# Patient Record
Sex: Male | Born: 1935
Health system: Southern US, Community
[De-identification: ages and names within clinical notes are randomized; demographics above are authoritative.]

## PROBLEM LIST (undated history)

## (undated) DIAGNOSIS — I2510 Atherosclerotic heart disease of native coronary artery without angina pectoris: Secondary | ICD-10-CM

## (undated) DIAGNOSIS — R55 Syncope and collapse: Secondary | ICD-10-CM

## (undated) DIAGNOSIS — Z951 Presence of aortocoronary bypass graft: Secondary | ICD-10-CM

## (undated) DIAGNOSIS — I10 Essential (primary) hypertension: Secondary | ICD-10-CM

## (undated) DIAGNOSIS — K635 Polyp of colon: Secondary | ICD-10-CM

## (undated) DIAGNOSIS — D696 Thrombocytopenia, unspecified: Secondary | ICD-10-CM

## (undated) DIAGNOSIS — E039 Hypothyroidism, unspecified: Secondary | ICD-10-CM

## (undated) DIAGNOSIS — I25110 Atherosclerotic heart disease of native coronary artery with unstable angina pectoris: Secondary | ICD-10-CM

## (undated) DIAGNOSIS — I779 Disorder of arteries and arterioles, unspecified: Secondary | ICD-10-CM

## (undated) DIAGNOSIS — I739 Peripheral vascular disease, unspecified: Secondary | ICD-10-CM

## (undated) HISTORY — DX: Hypothyroidism, unspecified: E03.9

## (undated) HISTORY — DX: Peripheral vascular disease, unspecified: I73.9

## (undated) HISTORY — DX: Polyp of colon: K63.5

## (undated) HISTORY — DX: Atherosclerotic heart disease of native coronary artery without angina pectoris: I25.10

## (undated) HISTORY — PX: CATARACT EXTRACTION: SUR2

## (undated) HISTORY — DX: Syncope and collapse: R55

## (undated) HISTORY — PX: HEMORRHOID SURGERY: SHX153

## (undated) HISTORY — DX: Essential (primary) hypertension: I10

## (undated) HISTORY — DX: Thrombocytopenia, unspecified: D69.6

## (undated) HISTORY — PX: REFRACTIVE SURGERY: SHX103

## (undated) HISTORY — DX: Disorder of arteries and arterioles, unspecified: I77.9

---

## 2002-10-07 ENCOUNTER — Ambulatory Visit (HOSPITAL_COMMUNITY): Admission: RE | Admit: 2002-10-07 | Discharge: 2002-10-07 | Payer: Self-pay | Admitting: Gastroenterology

## 2005-06-21 ENCOUNTER — Inpatient Hospital Stay (HOSPITAL_COMMUNITY): Admission: EM | Admit: 2005-06-21 | Discharge: 2005-06-24 | Payer: Self-pay | Admitting: Emergency Medicine

## 2013-07-24 DIAGNOSIS — E039 Hypothyroidism, unspecified: Secondary | ICD-10-CM | POA: Insufficient documentation

## 2013-07-24 DIAGNOSIS — E785 Hyperlipidemia, unspecified: Secondary | ICD-10-CM | POA: Insufficient documentation

## 2013-07-24 DIAGNOSIS — I10 Essential (primary) hypertension: Secondary | ICD-10-CM | POA: Insufficient documentation

## 2013-09-04 DIAGNOSIS — I471 Supraventricular tachycardia: Secondary | ICD-10-CM | POA: Insufficient documentation

## 2013-11-05 ENCOUNTER — Encounter: Payer: Self-pay | Admitting: *Deleted

## 2014-02-25 DIAGNOSIS — I471 Supraventricular tachycardia: Secondary | ICD-10-CM | POA: Diagnosis not present

## 2014-02-25 DIAGNOSIS — Z6827 Body mass index (BMI) 27.0-27.9, adult: Secondary | ICD-10-CM | POA: Diagnosis not present

## 2014-02-25 DIAGNOSIS — I10 Essential (primary) hypertension: Secondary | ICD-10-CM | POA: Diagnosis not present

## 2014-02-25 DIAGNOSIS — R55 Syncope and collapse: Secondary | ICD-10-CM | POA: Diagnosis not present

## 2014-05-25 DIAGNOSIS — I10 Essential (primary) hypertension: Secondary | ICD-10-CM | POA: Diagnosis not present

## 2014-05-25 DIAGNOSIS — L570 Actinic keratosis: Secondary | ICD-10-CM | POA: Diagnosis not present

## 2014-07-20 DIAGNOSIS — M436 Torticollis: Secondary | ICD-10-CM | POA: Diagnosis not present

## 2014-07-20 DIAGNOSIS — M542 Cervicalgia: Secondary | ICD-10-CM | POA: Diagnosis not present

## 2014-09-07 DIAGNOSIS — B078 Other viral warts: Secondary | ICD-10-CM | POA: Diagnosis not present

## 2014-09-07 DIAGNOSIS — L570 Actinic keratosis: Secondary | ICD-10-CM | POA: Diagnosis not present

## 2014-09-28 DIAGNOSIS — H25013 Cortical age-related cataract, bilateral: Secondary | ICD-10-CM | POA: Diagnosis not present

## 2014-09-28 DIAGNOSIS — H02401 Unspecified ptosis of right eyelid: Secondary | ICD-10-CM | POA: Diagnosis not present

## 2014-09-28 DIAGNOSIS — H2513 Age-related nuclear cataract, bilateral: Secondary | ICD-10-CM | POA: Diagnosis not present

## 2014-09-28 DIAGNOSIS — H5213 Myopia, bilateral: Secondary | ICD-10-CM | POA: Diagnosis not present

## 2014-10-13 DIAGNOSIS — H25012 Cortical age-related cataract, left eye: Secondary | ICD-10-CM | POA: Diagnosis not present

## 2014-10-13 DIAGNOSIS — H2512 Age-related nuclear cataract, left eye: Secondary | ICD-10-CM | POA: Diagnosis not present

## 2014-10-13 DIAGNOSIS — H25812 Combined forms of age-related cataract, left eye: Secondary | ICD-10-CM | POA: Diagnosis not present

## 2014-10-22 ENCOUNTER — Encounter (HOSPITAL_COMMUNITY): Payer: Self-pay | Admitting: Emergency Medicine

## 2014-10-22 ENCOUNTER — Emergency Department (HOSPITAL_COMMUNITY)
Admission: EM | Admit: 2014-10-22 | Discharge: 2014-10-23 | Disposition: A | Discharge status: Home / Self Care | Payer: Commercial Managed Care - HMO | Attending: Emergency Medicine | Admitting: Emergency Medicine

## 2014-10-22 DIAGNOSIS — R05 Cough: Secondary | ICD-10-CM | POA: Insufficient documentation

## 2014-10-22 DIAGNOSIS — K2270 Barrett's esophagus without dysplasia: Secondary | ICD-10-CM | POA: Insufficient documentation

## 2014-10-22 DIAGNOSIS — R112 Nausea with vomiting, unspecified: Secondary | ICD-10-CM | POA: Diagnosis not present

## 2014-10-22 DIAGNOSIS — T18128A Food in esophagus causing other injury, initial encounter: Secondary | ICD-10-CM | POA: Diagnosis not present

## 2014-10-22 DIAGNOSIS — K219 Gastro-esophageal reflux disease without esophagitis: Secondary | ICD-10-CM | POA: Diagnosis not present

## 2014-10-22 DIAGNOSIS — R1310 Dysphagia, unspecified: Secondary | ICD-10-CM | POA: Insufficient documentation

## 2014-10-22 DIAGNOSIS — X58XXXA Exposure to other specified factors, initial encounter: Secondary | ICD-10-CM | POA: Insufficient documentation

## 2014-10-22 DIAGNOSIS — K449 Diaphragmatic hernia without obstruction or gangrene: Secondary | ICD-10-CM | POA: Diagnosis not present

## 2014-10-22 DIAGNOSIS — K222 Esophageal obstruction: Secondary | ICD-10-CM

## 2014-10-22 DIAGNOSIS — R9431 Abnormal electrocardiogram [ECG] [EKG]: Secondary | ICD-10-CM | POA: Diagnosis not present

## 2014-10-22 DIAGNOSIS — S2231XA Fracture of one rib, right side, initial encounter for closed fracture: Secondary | ICD-10-CM | POA: Insufficient documentation

## 2014-10-22 DIAGNOSIS — I10 Essential (primary) hypertension: Secondary | ICD-10-CM | POA: Diagnosis not present

## 2014-10-22 DIAGNOSIS — R0789 Other chest pain: Secondary | ICD-10-CM | POA: Insufficient documentation

## 2014-10-22 DIAGNOSIS — E039 Hypothyroidism, unspecified: Secondary | ICD-10-CM | POA: Diagnosis not present

## 2014-10-22 DIAGNOSIS — Z86010 Personal history of colonic polyps: Secondary | ICD-10-CM | POA: Insufficient documentation

## 2014-10-22 DIAGNOSIS — D696 Thrombocytopenia, unspecified: Secondary | ICD-10-CM | POA: Insufficient documentation

## 2014-10-22 DIAGNOSIS — R42 Dizziness and giddiness: Secondary | ICD-10-CM | POA: Diagnosis not present

## 2014-10-22 DIAGNOSIS — Z888 Allergy status to other drugs, medicaments and biological substances status: Secondary | ICD-10-CM | POA: Insufficient documentation

## 2014-10-22 DIAGNOSIS — Z79899 Other long term (current) drug therapy: Secondary | ICD-10-CM | POA: Diagnosis not present

## 2014-10-22 DIAGNOSIS — R1013 Epigastric pain: Secondary | ICD-10-CM | POA: Insufficient documentation

## 2014-10-22 DIAGNOSIS — Z7982 Long term (current) use of aspirin: Secondary | ICD-10-CM | POA: Diagnosis not present

## 2014-10-22 DIAGNOSIS — Y939 Activity, unspecified: Secondary | ICD-10-CM | POA: Insufficient documentation

## 2014-10-22 DIAGNOSIS — R1110 Vomiting, unspecified: Secondary | ICD-10-CM

## 2014-10-22 LAB — I-STAT TROPONIN, ED: TROPONIN I, POC: 0.01 ng/mL (ref 0.00–0.08)

## 2014-10-22 LAB — BASIC METABOLIC PANEL
ANION GAP: 11 (ref 5–15)
BUN: 17 mg/dL (ref 6–20)
CALCIUM: 9.1 mg/dL (ref 8.9–10.3)
CO2: 26 mmol/L (ref 22–32)
CREATININE: 1.43 mg/dL — AB (ref 0.61–1.24)
Chloride: 98 mmol/L — ABNORMAL LOW (ref 101–111)
GFR, EST AFRICAN AMERICAN: 53 mL/min — AB (ref >60)
GFR, EST NON AFRICAN AMERICAN: 46 mL/min — AB (ref >60)
GLUCOSE: 111 mg/dL — AB (ref 65–99)
Potassium: 3.4 mmol/L — ABNORMAL LOW (ref 3.5–5.1)
Sodium: 135 mmol/L (ref 135–145)

## 2014-10-22 LAB — CBC
HCT: 40.4 % (ref 39.0–52.0)
Hemoglobin: 14.8 g/dL (ref 13.0–17.0)
MCH: 31.3 pg (ref 26.0–34.0)
MCHC: 36.6 g/dL — AB (ref 30.0–36.0)
MCV: 85.4 fL (ref 78.0–100.0)
PLATELETS: 112 10*3/uL — AB (ref 150–400)
RBC: 4.73 MIL/uL (ref 4.22–5.81)
RDW: 13.2 % (ref 11.5–15.5)
WBC: 8.3 10*3/uL (ref 4.0–10.5)

## 2014-10-22 LAB — I-STAT CG4 LACTIC ACID, ED: Lactic Acid, Venous: 1.28 mmol/L (ref 0.5–2.0)

## 2014-10-22 MED ORDER — LACTATED RINGERS IV BOLUS (SEPSIS)
1000.0000 mL | Freq: Once | INTRAVENOUS | Status: AC
  Administered 2014-10-23: 1000 mL via INTRAVENOUS

## 2014-10-22 MED ORDER — GI COCKTAIL ~~LOC~~
30.0000 mL | Freq: Once | ORAL | Status: AC
  Administered 2014-10-23: 30 mL via ORAL
  Filled 2014-10-22: qty 30

## 2014-10-22 NOTE — ED Provider Notes (Signed)
CSN: [id number]   Arrival date & time 10/22/14 1826  History  This chart was scribed for Derwood Kaplan, MD by Bethel Born, ED Scribe. This patient was seen in room D30C/D30C and the patient's care was started at 11:33 PM.  Chief Complaint  Patient presents with  . Near Syncope  . Abdominal Pain    HPI The history is provided by the patient and the spouse. No language interpreter was used.   Wayne Mitchell is a 79 y.o. male with PMHx HTN, carotid artery disease, hypothyroidism, and thrombocytopenia  who presents to the Emergency Department complaining of an episode of light headedness with onset around 10:30 AM while standing. He felt unsteady and had to hold himself up on a wall. The episode lasted for a few seconds. He didn't feel like he was going to faint. At that time he had no sweating, chest pain, or palpitations. Since the initial episode he has had several other less severe episodes of light headedness, usually with walking. Around lunch time the pt was at a restaurant when he had sudden onset of abdominal discomfort that he describes as bloating and started to produce white frothy sputum that he believes came from his stomach.  Associated symptoms include fatigue, chest pain (a constant and non-radiating "knot" in the center of the chest that has improved and is not affected by movement), and intermittent nausea (none at present).  His appetite has gone away since then. Pt notes 1 loose stool with no blood. He has had a dry cough for 1 week. He was referred to the ED today by Urgent Care. No history of cardiac disease, cancer, COPD, asthma, or DVT/PE. No history of abdominal surgery. Pt denies tobacco use.   Past Medical History  Diagnosis Date  . Syncope   . HTN (hypertension)   . Carotid artery disease   . Hypothyroidism   . Thrombocytopenia   . Colon polyp     small    Past Surgical History  Procedure Laterality Date  . Cataract extraction Left     Family History   Problem Relation Age of Onset  . Family history unknown: Yes    Social History  Substance Use Topics  . Smoking status: Never Smoker   . Smokeless tobacco: None  . Alcohol Use: 2.4 oz/week    4 Cans of beer per week     Review of Systems  Constitutional: Positive for appetite change and fatigue. Negative for diaphoresis.  Respiratory: Positive for cough. Negative for shortness of breath.   Cardiovascular: Positive for chest pain.  Gastrointestinal: Positive for nausea, abdominal pain and diarrhea (1 loose stool).       Spitting frothy white sputum  Neurological: Positive for light-headedness.    ROS 10 Systems reviewed and are negative for acute change except as noted in the HPI.    Home Medications   Prior to Admission medications   Medication Sig Start Date End Date Taking? Authorizing Provider  amLODipine (NORVASC) 10 MG tablet Take 10 mg by mouth daily.   Yes Historical Provider, MD  aspirin 81 MG tablet Take 81 mg by mouth daily.   Yes Historical Provider, MD  atorvastatin (LIPITOR) 20 MG tablet Take 20 mg by mouth daily.   Yes Historical Provider, MD  carvedilol (COREG) 6.25 MG tablet Take 3.125-6.25 mg by mouth See admin instructions. Take 1 tablet in the morning  and 0.5 tablet (3.125mg ) in the evening.   Yes Historical Provider, MD  chlorthalidone (HYGROTON) 25  MG tablet Take 25 mg by mouth daily.   Yes Historical Provider, MD  irbesartan (AVAPRO) 300 MG tablet Take 300 mg by mouth daily.   Yes Historical Provider, MD  levothyroxine (SYNTHROID, LEVOTHROID) 112 MCG tablet Take 112 mcg by mouth daily before breakfast.   Yes Historical Provider, MD    Allergies  Lisinopril  Triage Vitals: BP 157/76 mmHg  Pulse 83  Temp(Src) 98 F (36.7 C) (Oral)  Resp 12  SpO2 99%  Physical Exam  Constitutional: He is oriented to person, place, and time. He appears well-developed and well-nourished.  HENT:  Head: Normocephalic.  Eyes: EOM are normal.  Neck: Normal range of  motion.  Cardiovascular: Normal rate and regular rhythm.   Pulmonary/Chest: Effort normal.  CTAB  Abdominal: Soft. Bowel sounds are normal. He exhibits no distension. There is no tenderness. There is no tenderness at McBurney's point and negative Murphy's sign.  No focal tenderness No palpable mass  Musculoskeletal: Normal range of motion.  No edema  Neurological: He is alert and oriented to person, place, and time.  Psychiatric: He has a normal mood and affect.  Nursing note and vitals reviewed.   ED Course  Procedures   DIAGNOSTIC STUDIES: Oxygen Saturation is 97% on RA, normal by my interpretation.    COORDINATION OF CARE: 11:46 PM Discussed treatment plan which includes lab work, abdominal XR, EKG, GI cocktail, and IVF  with pt at bedside and pt agreed to plan.  3:33 AM I re-evaluated the patient and provided an update on the results of lab work and imaging.  PO challenge started :30, and patient failed it x 2. Threw up the meds given. Spit up the water. Denies any feeling that food is impacted. Swallow study ordered. He has no focal abd tenderness at the moment. Labs are reassuring. Pt will await the swallow eval here. If neg, he will be discharged with specific instruction for return precautions already verbalized. Pt will contact his pcc tomorrow and will be on clear liquid diet. If swallow eval is abnormal, he will need admission.   Labs Reviewed  BASIC METABOLIC PANEL - Abnormal; Notable for the following:    Potassium 3.4 (*)    Chloride 98 (*)    Glucose, Bld 111 (*)    Creatinine, Ser 1.43 (*)    GFR calc non Af Amer 46 (*)    GFR calc Af Amer 53 (*)    All other components within normal limits  CBC - Abnormal; Notable for the following:    MCHC 36.6 (*)    Platelets 112 (*)    All other components within normal limits  URINALYSIS, ROUTINE W REFLEX MICROSCOPIC (NOT AT [facility]) - Abnormal; Notable for the following:    APPearance CLOUDY (*)    All other  components within normal limits  HEPATIC FUNCTION PANEL - Abnormal; Notable for the following:    Bilirubin, Direct <0.1 (*)    All other components within normal limits  I-STAT TROPOININ, ED  I-STAT CG4 LACTIC ACID, ED  Rosezena Sensor, ED    Imaging Review Dg Abd Acute W/chest  10/23/2014   CLINICAL DATA:  Vomiting and epigastric pain.  EXAM: DG ABDOMEN ACUTE W/ 1V CHEST  COMPARISON:  Chest 06/21/2005  FINDINGS: Normal heart size and pulmonary vascularity. No focal airspace disease or consolidation in the lungs. No blunting of costophrenic angles. No pneumothorax. Mediastinal contours appear intact.  Scattered gas and stool in the colon. No small or large bowel distention. No free  intra-abdominal air. No abnormal air-fluid levels. No radiopaque stones. Degenerative changes in the spine and hips. Old right rib fracture.  IMPRESSION: No evidence of active pulmonary disease. Nonobstructive bowel gas pattern.   Electronically Signed   By: Burman Nieves M.D.   On: 10/23/2014 02:09    I, Derwood Kaplan, MD, personally reviewed and evaluated these images and lab results as part of my medical decision-making.   EKG Interpretation  Date/Time:  Saturday October 22 2014 18:55:38 EDT Ventricular Rate:  85 PR Interval:  184 QRS Duration: 144 QT Interval:  420 QTC Calculation: 499 R Axis:   86 Text Interpretation:  Sinus rhythm with Fusion complexes Abnormal ECG New t wave inversion v1 No significant change since last tracing Confirmed by Rhunette Croft, MD, Janey Genta [phone]) on 10/22/2014 11:12:52 PM    MDM   Final diagnoses:  Dizziness  Vomiting in adult    I personally performed the services described in this documentation, which was scribed in my presence. The recorded information has been reviewed and is accurate.  Pt comes in with intermittent lightheadedness, abdominal discomfort. He DENIES SYNCOPE OR NEAR FAINTING. He has some chest discomfort centrally which has been intermittent, bit  more constant than not. Pain is atypical. Not exertional, not pleuritic, nor muscular and not reproducible or positional. Pt is also having lot of belching and he has been spitting out his saliva all day.  EKG shows no acute pathology.  DDX: ACS, SBO, Orthostatic hypotension, Stroke, Vertebral artery dissection/stenosis, dysrhythmia, PE, Valvular disorder/Cardiomyopathy, Anemia  Will monitor closely and get trops x 2.      Derwood Kaplan, MD 10/23/14 [phone]

## 2014-10-22 NOTE — ED Notes (Signed)
MD at bedside. 

## 2014-10-22 NOTE — ED Notes (Signed)
Pt reports intermittent light headedness which started today at 1000. Pt then began having episodes of vomiting after lunch today with associated epigastric pain. Pt alert x4. NAD at this time.

## 2014-10-23 ENCOUNTER — Emergency Department (HOSPITAL_COMMUNITY): Payer: Commercial Managed Care - HMO

## 2014-10-23 ENCOUNTER — Ambulatory Visit (HOSPITAL_COMMUNITY): Admit: 2014-10-23 | Payer: Self-pay | Admitting: Gastroenterology

## 2014-10-23 ENCOUNTER — Encounter (HOSPITAL_COMMUNITY): Payer: Self-pay

## 2014-10-23 ENCOUNTER — Encounter (HOSPITAL_COMMUNITY)
Admission: EM | Disposition: A | Discharge status: Home / Self Care | Payer: Self-pay | Source: Home / Self Care | Attending: Emergency Medicine

## 2014-10-23 DIAGNOSIS — R0789 Other chest pain: Secondary | ICD-10-CM | POA: Diagnosis not present

## 2014-10-23 DIAGNOSIS — D696 Thrombocytopenia, unspecified: Secondary | ICD-10-CM | POA: Diagnosis not present

## 2014-10-23 DIAGNOSIS — J210 Acute bronchiolitis due to respiratory syncytial virus: Secondary | ICD-10-CM | POA: Diagnosis not present

## 2014-10-23 DIAGNOSIS — R05 Cough: Secondary | ICD-10-CM | POA: Diagnosis not present

## 2014-10-23 DIAGNOSIS — T18128A Food in esophagus causing other injury, initial encounter: Secondary | ICD-10-CM | POA: Diagnosis not present

## 2014-10-23 DIAGNOSIS — R1013 Epigastric pain: Secondary | ICD-10-CM | POA: Diagnosis not present

## 2014-10-23 DIAGNOSIS — K209 Esophagitis, unspecified: Secondary | ICD-10-CM | POA: Diagnosis not present

## 2014-10-23 DIAGNOSIS — R1310 Dysphagia, unspecified: Secondary | ICD-10-CM | POA: Diagnosis not present

## 2014-10-23 DIAGNOSIS — R1110 Vomiting, unspecified: Secondary | ICD-10-CM | POA: Diagnosis not present

## 2014-10-23 DIAGNOSIS — K2270 Barrett's esophagus without dysplasia: Secondary | ICD-10-CM | POA: Diagnosis not present

## 2014-10-23 DIAGNOSIS — K219 Gastro-esophageal reflux disease without esophagitis: Secondary | ICD-10-CM | POA: Diagnosis not present

## 2014-10-23 DIAGNOSIS — K449 Diaphragmatic hernia without obstruction or gangrene: Secondary | ICD-10-CM | POA: Diagnosis not present

## 2014-10-23 HISTORY — PX: ESOPHAGOGASTRODUODENOSCOPY: SHX5428

## 2014-10-23 LAB — I-STAT TROPONIN, ED: Troponin i, poc: 0.01 ng/mL (ref 0.00–0.08)

## 2014-10-23 LAB — HEPATIC FUNCTION PANEL
ALT: 18 U/L (ref 17–63)
AST: 24 U/L (ref 15–41)
Albumin: 4.3 g/dL (ref 3.5–5.0)
Alkaline Phosphatase: 75 U/L (ref 38–126)
Total Bilirubin: 0.7 mg/dL (ref 0.3–1.2)
Total Protein: 7.7 g/dL (ref 6.5–8.1)

## 2014-10-23 LAB — URINALYSIS, ROUTINE W REFLEX MICROSCOPIC
Bilirubin Urine: NEGATIVE
GLUCOSE, UA: NEGATIVE mg/dL
HGB URINE DIPSTICK: NEGATIVE
KETONES UR: NEGATIVE mg/dL
LEUKOCYTES UA: NEGATIVE
Nitrite: NEGATIVE
PROTEIN: NEGATIVE mg/dL
Specific Gravity, Urine: 1.012 (ref 1.005–1.030)
Urobilinogen, UA: 0.2 mg/dL (ref 0.0–1.0)
pH: 6 (ref 5.0–8.0)

## 2014-10-23 SURGERY — EGD (ESOPHAGOGASTRODUODENOSCOPY)
Anesthesia: Moderate Sedation

## 2014-10-23 MED ORDER — MIDAZOLAM HCL 10 MG/2ML IJ SOLN
INTRAMUSCULAR | Status: DC | PRN
Start: 2014-10-23 — End: 2014-10-23
  Administered 2014-10-23 (×2): 2 mg via INTRAVENOUS
  Administered 2014-10-23: 1 mg via INTRAVENOUS
  Administered 2014-10-23: 2 mg via INTRAVENOUS

## 2014-10-23 MED ORDER — SODIUM CHLORIDE 0.9 % IV SOLN
Freq: Once | INTRAVENOUS | Status: AC
  Administered 2014-10-23: 12:00:00 via INTRAVENOUS

## 2014-10-23 MED ORDER — FENTANYL CITRATE (PF) 100 MCG/2ML IJ SOLN
INTRAMUSCULAR | Status: AC
  Filled 2014-10-23: qty 2

## 2014-10-23 MED ORDER — SODIUM CHLORIDE 0.9 % IV SOLN: INTRAVENOUS | Status: DC

## 2014-10-23 MED ORDER — FENTANYL CITRATE (PF) 100 MCG/2ML IJ SOLN
INTRAMUSCULAR | Status: DC | PRN
  Administered 2014-10-23 (×2): 25 ug via INTRAVENOUS

## 2014-10-23 MED ORDER — DIPHENHYDRAMINE HCL 50 MG/ML IJ SOLN
INTRAMUSCULAR | Status: AC
  Filled 2014-10-23: qty 1

## 2014-10-23 MED ORDER — ASPIRIN 81 MG PO CHEW
324.0000 mg | CHEWABLE_TABLET | Freq: Once | ORAL | Status: AC
  Administered 2014-10-23: 324 mg via ORAL
  Filled 2014-10-23: qty 4

## 2014-10-23 MED ORDER — MIDAZOLAM HCL 5 MG/ML IJ SOLN
INTRAMUSCULAR | Status: AC
  Filled 2014-10-23: qty 2

## 2014-10-23 NOTE — ED Notes (Signed)
The pt was given  Aspirin to chew  Then he was given some water.  He immediately started swallowing rapidly then white froathy sputum came back up

## 2014-10-23 NOTE — ED Notes (Signed)
Pt transported to Endoscopy 

## 2014-10-23 NOTE — ED Notes (Signed)
Water given  

## 2014-10-23 NOTE — ED Notes (Signed)
Patient transported to X-ray 

## 2014-10-23 NOTE — H&P (Signed)
Wayne Mitchell is an 79 y.o. male.   Chief Complaint: Dysphagia HPI: Wayne Mitchell got stuck last night. One similar episode about 4 months ago. No chronic dysphagia, anorexia, or weight loss  Past Medical History  Diagnosis Date  . Syncope   . HTN (hypertension)   . Carotid artery disease   . Hypothyroidism   . Thrombocytopenia   . Colon polyp     small    Past Surgical History  Procedure Laterality Date  . Cataract extraction Left     Family History  Problem Relation Age of Onset  . Family history unknown: Yes   Social History:  reports that he has never smoked. He does not have any smokeless tobacco history on file. He reports that he drinks about 2.4 oz of alcohol per week. He reports that he does not use illicit drugs.  Allergies:  Allergies  Allergen Reactions  . Lisinopril Cough    Cough     Medications Prior to Admission  Medication Sig Dispense Refill  . amLODipine (NORVASC) 10 MG tablet Take 10 mg by mouth daily.    Marland Kitchen aspirin 81 MG tablet Take 81 mg by mouth daily.    Marland Kitchen atorvastatin (LIPITOR) 20 MG tablet Take 20 mg by mouth daily.    . carvedilol (COREG) 6.25 MG tablet Take 3.125-6.25 mg by mouth See admin instructions. Take 1 tablet in the morning  and 0.5 tablet (3.152m) in the evening.    . chlorthalidone (HYGROTON) 25 MG tablet Take 25 mg by mouth daily.    . irbesartan (AVAPRO) 300 MG tablet Take 300 mg by mouth daily.    .Marland Kitchenlevothyroxine (SYNTHROID, LEVOTHROID) 112 MCG tablet Take 112 mcg by mouth daily before breakfast.      Results for orders placed or performed during the hospital encounter of 10/22/14 (from the past 48 hour(s))  Basic metabolic panel     Status: Abnormal   Collection Time: 10/22/14  7:33 PM  Result Value Ref Range   Sodium 135 135 - 145 mmol/L   Potassium 3.4 (L) 3.5 - 5.1 mmol/L   Chloride 98 (L) 101 - 111 mmol/L   CO2 26 22 - 32 mmol/L   Glucose, Bld 111 (H) 65 - 99 mg/dL   BUN 17 6 - 20 mg/dL   Creatinine, Ser 1.43 (H) 0.61 -  1.24 mg/dL   Calcium 9.1 8.9 - 10.3 mg/dL   GFR calc non Af Amer 46 (L) >60 mL/min   GFR calc Af Amer 53 (L) >60 mL/min    Comment: (NOTE) The eGFR has been calculated using the CKD EPI equation. This calculation has not been validated in all clinical situations. eGFR's persistently <60 mL/min signify possible Chronic Kidney Disease.    Anion gap 11 5 - 15  CBC     Status: Abnormal   Collection Time: 10/22/14  7:33 PM  Result Value Ref Range   WBC 8.3 4.0 - 10.5 K/uL   RBC 4.73 4.22 - 5.81 MIL/uL   Hemoglobin 14.8 13.0 - 17.0 g/dL   HCT 40.4 39.0 - 52.0 %   MCV 85.4 78.0 - 100.0 fL   MCH 31.3 26.0 - 34.0 pg   MCHC 36.6 (H) 30.0 - 36.0 g/dL   RDW 13.2 11.5 - 15.5 %   Platelets 112 (L) 150 - 400 K/uL    Comment: PLATELET COUNT CONFIRMED BY SMEAR LARGE PLATELETS PRESENT   Hepatic function panel     Status: Abnormal   Collection Time: 10/22/14  7:33 PM  Result Value Ref Range   Total Protein 7.7 6.5 - 8.1 g/dL   Albumin 4.3 3.5 - 5.0 g/dL   AST 24 15 - 41 U/L   ALT 18 17 - 63 U/L   Alkaline Phosphatase 75 38 - 126 U/L   Total Bilirubin 0.7 0.3 - 1.2 mg/dL   Bilirubin, Direct <0.1 (L) 0.1 - 0.5 mg/dL   Indirect Bilirubin NOT CALCULATED 0.3 - 0.9 mg/dL  I-stat troponin, ED     Status: None   Collection Time: 10/22/14 11:26 PM  Result Value Ref Range   Troponin i, poc 0.01 0.00 - 0.08 ng/mL   Comment 3            Comment: Due to the release kinetics of cTnI, a negative result within the first hours of the onset of symptoms does not rule out myocardial infarction with certainty. If myocardial infarction is still suspected, repeat the test at appropriate intervals.   I-Stat CG4 Lactic Acid, ED     Status: None   Collection Time: 10/22/14 11:51 PM  Result Value Ref Range   Lactic Acid, Venous 1.28 0.5 - 2.0 mmol/L  Urinalysis, Routine w reflex microscopic (not at [facility])     Status: Abnormal   Collection Time: 10/23/14 12:01 AM  Result Value Ref Range   Color, Urine YELLOW  YELLOW   APPearance CLOUDY (A) CLEAR   Specific Gravity, Urine 1.012 1.005 - 1.030   pH 6.0 5.0 - 8.0   Glucose, UA NEGATIVE NEGATIVE mg/dL   Hgb urine dipstick NEGATIVE NEGATIVE   Bilirubin Urine NEGATIVE NEGATIVE   Ketones, ur NEGATIVE NEGATIVE mg/dL   Protein, ur NEGATIVE NEGATIVE mg/dL   Urobilinogen, UA 0.2 0.0 - 1.0 mg/dL   Nitrite NEGATIVE NEGATIVE   Leukocytes, UA NEGATIVE NEGATIVE    Comment: MICROSCOPIC NOT DONE ON URINES WITH NEGATIVE PROTEIN, BLOOD, LEUKOCYTES, NITRITE, OR GLUCOSE <1000 mg/dL. Performed at [facility] troponin, ED     Status: None   Collection Time: 10/23/14  4:03 AM  Result Value Ref Range   Troponin i, poc 0.01 0.00 - 0.08 ng/mL   Comment 3            Comment: Due to the release kinetics of cTnI, a negative result within the first hours of the onset of symptoms does not rule out myocardial infarction with certainty. If myocardial infarction is still suspected, repeat the test at appropriate intervals.    Dg Esophagus  10/23/2014   CLINICAL DATA:  Difficulty with swallowing since yesterday. Patient was eating a hamburger, when he suddenly felt full. Unable to keep down food or liquids since then.  EXAM: ESOPHOGRAM/BARIUM SWALLOW  TECHNIQUE: Single contrast examination was performed using  thin barium.  FLUOROSCOPY TIME:  1.2 min. corresponding to a Dose Area Product of 1362 Gy*m2  COMPARISON:  None.  FINDINGS: Only single contrast study was performed. Primary peristaltic wave is normal. There is an obvious obstruction in the distal esophagus, probable stricture, above which an intraluminal filling defect is observed. This defect measures approximately 13 x 13 x 23 mm. There is slight surrounding mucosal irregularity. The filling defect is favored to represent impacted food, but a distal esophageal neoplasm is not excluded. No contrast extravasation. Upper endoscopy is recommended for further evaluation.  IMPRESSION: Distal esophageal  stricture. Intraluminal filling defect favored to represent food although esophageal neoplasm is not excluded. Upper endoscopy recommended for further evaluation. Findings discussed with EDP.  Electronically Signed   By: Staci Righter M.D.   On: 10/23/2014 11:16   Dg Abd Acute W/chest  10/23/2014   CLINICAL DATA:  Vomiting and epigastric pain.  EXAM: DG ABDOMEN ACUTE W/ 1V CHEST  COMPARISON:  Chest 06/21/2005  FINDINGS: Normal heart size and pulmonary vascularity. No focal airspace disease or consolidation in the lungs. No blunting of costophrenic angles. No pneumothorax. Mediastinal contours appear intact.  Scattered gas and stool in the colon. No small or large bowel distention. No free intra-abdominal air. No abnormal air-fluid levels. No radiopaque stones. Degenerative changes in the spine and hips. Old right rib fracture.  IMPRESSION: No evidence of active pulmonary disease. Nonobstructive bowel gas pattern.   Electronically Signed   By: Lucienne Capers M.D.   On: 10/23/2014 02:09    ROS see history of present illness  Blood pressure 180/71, pulse 64, temperature 98 F (36.7 C), temperature source Oral, resp. rate 12, SpO2 99 %. Physical Exam  healthy-appearing gentleman in no distress. Occasional eructation. Chest clear. Heart without murmur or arrhythmia. Abdomen without distention, guarding, mass effect, or tenderness. Skin warm, radial pulses full. No evident neurologic deficits.  Assessment/Plan Probable food impaction above distal esophageal stricture. We will proceed to endoscopic evaluation with possible esophageal dilatation, nature purpose and risks reviewed and patient agreeable.  Pratyush Ammon V 10/23/2014, 1:22 PM

## 2014-10-23 NOTE — ED Notes (Signed)
The pts wife has been  Sent home.  The pt is waiting  For a special xray this am  By radiilogy

## 2014-10-23 NOTE — ED Provider Notes (Signed)
Handoff received from Dr. Rhunette Croft at 0800 with plan for f/u swallow esophagram as Pt failing po.   Results:  BP 142/68 mmHg  Pulse 79  Temp(Src) 98.1 F (36.7 C) (Oral)  Resp 19  SpO2 99%  Results for orders placed or performed during the hospital encounter of 10/22/14  Basic metabolic panel  Result Value Ref Range   Sodium 135 135 - 145 mmol/L   Potassium 3.4 (L) 3.5 - 5.1 mmol/L   Chloride 98 (L) 101 - 111 mmol/L   CO2 26 22 - 32 mmol/L   Glucose, Bld 111 (H) 65 - 99 mg/dL   BUN 17 6 - 20 mg/dL   Creatinine, Ser 4.09 (H) 0.61 - 1.24 mg/dL   Calcium 9.1 8.9 - 81.1 mg/dL   GFR calc non Af Amer 46 (L) >60 mL/min   GFR calc Af Amer 53 (L) >60 mL/min   Anion gap 11 5 - 15  CBC  Result Value Ref Range   WBC 8.3 4.0 - 10.5 K/uL   RBC 4.73 4.22 - 5.81 MIL/uL   Hemoglobin 14.8 13.0 - 17.0 g/dL   HCT 91.4 78.2 - 95.6 %   MCV 85.4 78.0 - 100.0 fL   MCH 31.3 26.0 - 34.0 pg   MCHC 36.6 (H) 30.0 - 36.0 g/dL   RDW 21.3 08.6 - 57.8 %   Platelets 112 (L) 150 - 400 K/uL  Urinalysis, Routine w reflex microscopic (not at [facility])  Result Value Ref Range   Color, Urine YELLOW YELLOW   APPearance CLOUDY (A) CLEAR   Specific Gravity, Urine 1.012 1.005 - 1.030   pH 6.0 5.0 - 8.0   Glucose, UA NEGATIVE NEGATIVE mg/dL   Hgb urine dipstick NEGATIVE NEGATIVE   Bilirubin Urine NEGATIVE NEGATIVE   Ketones, ur NEGATIVE NEGATIVE mg/dL   Protein, ur NEGATIVE NEGATIVE mg/dL   Urobilinogen, UA 0.2 0.0 - 1.0 mg/dL   Nitrite NEGATIVE NEGATIVE   Leukocytes, UA NEGATIVE NEGATIVE  Hepatic function panel  Result Value Ref Range   Total Protein 7.7 6.5 - 8.1 g/dL   Albumin 4.3 3.5 - 5.0 g/dL   AST 24 15 - 41 U/L   ALT 18 17 - 63 U/L   Alkaline Phosphatase 75 38 - 126 U/L   Total Bilirubin 0.7 0.3 - 1.2 mg/dL   Bilirubin, Direct <4.6 (L) 0.1 - 0.5 mg/dL   Indirect Bilirubin NOT CALCULATED 0.3 - 0.9 mg/dL  I-stat troponin, ED  Result Value Ref Range   Troponin i, poc 0.01 0.00 - 0.08 ng/mL   Comment 3          I-Stat CG4 Lactic Acid, ED  Result Value Ref Range   Lactic Acid, Venous 1.28 0.5 - 2.0 mmol/L  I-stat troponin, ED  Result Value Ref Range   Troponin i, poc 0.01 0.00 - 0.08 ng/mL   Comment 3            Dg Esophagus  10/23/2014   CLINICAL DATA:  Difficulty with swallowing since yesterday. Patient was eating a hamburger, when he suddenly felt full. Unable to keep down food or liquids since then.  EXAM: ESOPHOGRAM/BARIUM SWALLOW  TECHNIQUE: Single contrast examination was performed using  thin barium.  FLUOROSCOPY TIME:  1.2 min. corresponding to a Dose Area Product of 1362 Gy*m2  COMPARISON:  None.  FINDINGS: Only single contrast study was performed. Primary peristaltic wave is normal. There is an obvious obstruction in the distal esophagus, probable stricture, above which an intraluminal filling  defect is observed. This defect measures approximately 13 x 13 x 23 mm. There is slight surrounding mucosal irregularity. The filling defect is favored to represent impacted food, but a distal esophageal neoplasm is not excluded. No contrast extravasation. Upper endoscopy is recommended for further evaluation.  IMPRESSION: Distal esophageal stricture. Intraluminal filling defect favored to represent food although esophageal neoplasm is not excluded. Upper endoscopy recommended for further evaluation. Findings discussed with EDP.   Electronically Signed   By: Elsie Stain M.D.   On: 10/23/2014 11:16   Dg Abd Acute W/chest  10/23/2014   CLINICAL DATA:  Vomiting and epigastric pain.  EXAM: DG ABDOMEN ACUTE W/ 1V CHEST  COMPARISON:  Chest 06/21/2005  FINDINGS: Normal heart size and pulmonary vascularity. No focal airspace disease or consolidation in the lungs. No blunting of costophrenic angles. No pneumothorax. Mediastinal contours appear intact.  Scattered gas and stool in the colon. No small or large bowel distention. No free intra-abdominal air. No abnormal air-fluid levels. No radiopaque  stones. Degenerative changes in the spine and hips. Old right rib fracture.  IMPRESSION: No evidence of active pulmonary disease. Nonobstructive bowel gas pattern.   Electronically Signed   By: Burman Nieves M.D.   On: 10/23/2014 02:09    Diagnoses that have been ruled out:  None  Diagnoses that are still under consideration:  None  Final diagnoses:  Dizziness  Vomiting in adult  Esophageal stricture  Food impaction of esophagus, initial encounter    Esophagram concerning for esophageal stricture with food bolus impaction vs mass. GI consulted.   Pt went to endoscopy with GI. VSS on return and reportedly food obstruction, uncomplicated. Routine follow up.   Lyndal Pulley, MD 10/23/14 [phone]

## 2014-10-23 NOTE — ED Notes (Signed)
Pt tolerated water without difficulty 

## 2014-10-23 NOTE — Discharge Instructions (Signed)
We saw you in the ER for the episodic dizziness and food spitting  All the results in the ER are normal, labs and imaging. We are not sure what is causing your symptoms. The workup in the ER is not complete, and is limited to screening for life threatening and emergent conditions only, so please see a primary care doctor for further evaluation. GI DOCTOR FOLLOW UP PROVIDED AS WELL.  Please return to the ER if your symptoms worsen; you have increased pain, fevers, chills, inability to keep any medications down, confusion. Otherwise see the outpatient doctor as requested.    Dizziness Dizziness is a common problem. It is a feeling of unsteadiness or light-headedness. You may feel like you are about to faint. Dizziness can lead to injury if you stumble or fall. A person of any age group can suffer from dizziness, but dizziness is more common in older adults. CAUSES  Dizziness can be caused by many different things, including:  Middle ear problems.  Standing for too long.  Infections.  An allergic reaction.  Aging.  An emotional response to something, such as the sight of blood.  Side effects of medicines.  Tiredness.  Problems with circulation or blood pressure.  Excessive use of alcohol or medicines, or illegal drug use.  Breathing too fast (hyperventilation).  An irregular heart rhythm (arrhythmia).  A low red blood cell count (anemia).  Pregnancy.  Vomiting, diarrhea, fever, or other illnesses that cause body fluid loss (dehydration).  Diseases or conditions such as Parkinson's disease, high blood pressure (hypertension), diabetes, and thyroid problems.  Exposure to extreme heat. DIAGNOSIS  Your health care provider will ask about your symptoms, perform a physical exam, and perform an electrocardiogram (ECG) to record the electrical activity of your heart. Your health care provider may also perform other heart or blood tests to determine the cause of your dizziness.  These may include:  Transthoracic echocardiogram (TTE). During echocardiography, sound waves are used to evaluate how blood flows through your heart.  Transesophageal echocardiogram (TEE).  Cardiac monitoring. This allows your health care provider to monitor your heart rate and rhythm in real time.  Holter monitor. This is a portable device that records your heartbeat and can help diagnose heart arrhythmias. It allows your health care provider to track your heart activity for several days if needed.  Stress tests by exercise or by giving medicine that makes the heart beat faster. TREATMENT  Treatment of dizziness depends on the cause of your symptoms and can vary greatly. HOME CARE INSTRUCTIONS   Drink enough fluids to keep your urine clear or pale yellow. This is especially important in very hot weather. In older adults, it is also important in cold weather.  Take your medicine exactly as directed if your dizziness is caused by medicines. When taking blood pressure medicines, it is especially important to get up slowly.  Rise slowly from chairs and steady yourself until you feel okay.  In the morning, first sit up on the side of the bed. When you feel okay, stand slowly while holding onto something until you know your balance is fine.  Move your legs often if you need to stand in one place for a long time. Tighten and relax your muscles in your legs while standing.  Have someone stay with you for 1-2 days if dizziness continues to be a problem. Do this until you feel you are well enough to stay alone. Have the person call your health care provider if he  or she notices changes in you that are concerning.  Do not drive or use heavy machinery if you feel dizzy.  Do not drink alcohol. SEEK IMMEDIATE MEDICAL CARE IF:   Your dizziness or light-headedness gets worse.  You feel nauseous or vomit.  You have problems talking, walking, or using your arms, hands, or legs.  You feel  weak.  You are not thinking clearly or you have trouble forming sentences. It may take a friend or family member to notice this.  You have chest pain, abdominal pain, shortness of breath, or sweating.  Your vision changes.  You notice any bleeding.  You have side effects from medicine that seems to be getting worse rather than better. MAKE SURE YOU:   Understand these instructions.  Will watch your condition.  Will get help right away if you are not doing well or get worse. Document Released: 07/24/2000 Document Revised: 02/02/2013 Document Reviewed: 08/17/2010 [facility] Patient Information 2015 [place], [place]. This information is not intended to replace advice given to you by your health care provider. Make sure you discuss any questions you have with your health care provider. Clear Liquid Diet A clear liquid diet is a short-term diet that is prescribed to provide the necessary fluid and basic energy you need when you can have nothing else. The clear liquid diet consists of liquids or solids that will become liquid at room temperature. You should be able to see through the liquid. There are many reasons that you may be restricted to clear liquids, such as:  When you have a sudden-onset (acute) condition that occurs before or after surgery.  To help your body slowly get adjusted to food again after a long period when you were unable to have food.  Replacement of fluids when you have a diarrheal disease.  When you are going to have certain exams, such as a colonoscopy, in which instruments are inserted inside your body to look at parts of your digestive system. WHAT CAN I HAVE? A clear liquid diet does not provide all the nutrients you need. It is important to choose a variety of the following items to get as many nutrients as possible:  Vegetable juices that do not have pulp.  Fruit juices and fruit drinks that do not have pulp.  Coffee (regular or decaffeinated), tea, or soda  at the discretion of your health care provider.  Clear bouillon, broth, or strained broth-based soups.  High-protein and flavored gelatins.  Sugar or honey.  Ices or frozen ice pops that do not contain milk. If you are not sure whether you can have certain items, you should ask your health care provider. You may also ask your health care provider if there are any other clear liquid options. Document Released: 01/28/2005 Document Revised: 02/02/2013 Document Reviewed: 12/25/2012 [facility] Patient Information 2015 [place], [place]. This information is not intended to replace advice given to you by your health care provider. Make sure you discuss any questions you have with your health care provider.  Swallowed Foreign Body, Adult You have swallowed an object (foreign body). Once the foreign body has passed through the food tube (esophagus), which leads from the mouth to the stomach, it will usually continue through the body without problems. This is because the point where the esophagus enters into the stomach is the narrowest place through which the foreign body must pass. Sometimes the foreign body gets stuck. The most common type of foreign body obstruction in adults is food impaction. Many times, bones  from fish or meat products may become lodged in the esophagus or injure the throat on the way down. When there is an object that obstructs the esophagus, the most obvious symptoms are pain and the inability to swallow normally. In some cases, foreign bodies that can be life threatening are swallowed. Examples of these are certain medications and illicit drugs. Often in these instances, patients are afraid of telling what they swallowed. However, it is extremely important to tell the emergency caregiver what was swallowed because life-saving treatment may be needed.  X-ray exams may be taken to find the location of the foreign body. However, some objects do not show up well or may be too small to be  seen on an X-ray image. If the foreign body is too large or too sharp, it may be too dangerous to allow it to pass on its own. You may need to see a caregiver who specializes in the digestive system (gastroenterologist). In a few cases, a specialist may need to remove the object using a method called "endoscopy". This involves passing a thin, soft, flexible tube into the food pipe to locate and remove the object. Follow up with your primary doctor or the referral you were given by the emergency caregiver. HOME CARE INSTRUCTIONS   If your caregiver says it is safe for you to eat, then only have liquids and soft foods until your symptoms improve.  Once you are eating normally:  Cut food into small pieces.  Remove small bones from food.  Remove large seeds and pits from fruit.  Chew your food well.  Do not talk, laugh, or engage in physical activity while eating or swallowing. SEEK MEDICAL CARE IF:  You develop worsening shortness of breath, uncontrollable coughing, chest pains or high fever, greater than 102 F (38.9 C).  You are unable to eat or drink or you feel that food is getting stuck in your throat.  You have choking symptoms or cannot stop drooling.  You develop abdominal pain, vomiting (especially of blood), or rectal bleeding. MAKE SURE YOU:   Understand these instructions.  Will watch your condition.  Will get help right away if you are not doing well or get worse. Document Released: 07/18/2009 Document Revised: 04/22/2011 Document Reviewed: 07/18/2009 [facility] Patient Information 2015 [place], [place]. This information is not intended to replace advice given to you by your health care provider. Make sure you discuss any questions you have with your health care provider. Esophagogastroduodenoscopy Care After Refer to this sheet in the next few weeks. These instructions provide you with information on caring for yourself after your procedure. Your caregiver may also give  you more specific instructions. Your treatment has been planned according to current medical practices, but problems sometimes occur. Call your caregiver if you have any problems or questions after your procedure.  HOME CARE INSTRUCTIONS  Do not eat or drink anything until the numbing medicine (local anesthetic) has worn off and your gag reflex has returned. You will know that the local anesthetic has worn off when you can swallow comfortably.  Do not drive for 12 hours after the procedure or as directed by your caregiver.  Only take medicines as directed by your caregiver. SEEK MEDICAL CARE IF:   You cannot stop coughing.  You are not urinating at all or less than usual. SEEK IMMEDIATE MEDICAL CARE IF:  You have difficulty swallowing.  You cannot eat or drink.  You have worsening throat or chest pain.  You have dizziness,  lightheadedness, or you faint.  You have nausea or vomiting.  You have chills.  You have a fever.  You have severe abdominal pain.  You have black, tarry, or bloody stools. Document Released: 01/15/2012 Document Reviewed: 01/15/2012 [facility] Patient Information 2015 [place], [place]. This information is not intended to replace advice given to you by your health care provider. Make sure you discuss any questions you have with your health care provider.    OMEPRAZOLE 20 MG TWICE A DAY (preferably 1/2 hr before breakfast and dinner)  CHOP ALL FOOD, EAT SLOWLY, CHEW WELL, INTERSPERSE LIQUID WITH SOLIDS.  FOLLOW UP WITH ME IN THE OFFICE (call [phone] and ask for Velna Hatchet; request a "hospital follow-up appointment" for early October).

## 2014-10-23 NOTE — Op Note (Signed)
[facility] [address] [place], [address]   ENDOSCOPY PROCEDURE REPORT  PATIENT: Wayne Mitchell, Wayne Mitchell  MR#: [id number] BIRTHDATE: 07-08-1935 , 78  yrs. old GENDER: male ENDOSCOPIST:Nicolo Tomko, MD REFERRED BY: Georgann Housekeeper, MD PROCEDURE DATE:  2014-11-10 PROCEDURE:   upper endoscopy with foreign body removal and biopsies ASA CLASS:    III INDICATIONS: dysphagia with food impaction yesterday evening which has persisted to today MEDICATION: fentanyl 50 g, Versed 7 mg IV TOPICAL ANESTHETIC:   none  DESCRIPTION OF PROCEDURE:   The patient was brought from the emergency room as an outpatient up to the [facility] cone endoscopy unit.  After the risks and benefits of the procedure were explained, informed consent was obtained. The patient then received intravenous sedation and remained stable throughout the procedure.  The Pentax Gastroscope [id number]  endoscope was introduced through the mouth  and advanced to the second portion of the duodenum .  The instrument was slowly withdrawn as the mucosa was fully examined. Estimated blood loss is zero unless otherwise noted in this procedure report.    The larynx was not well seen during passage of the scope under direct vision.  The proximal esophagus had an inlet patch of salmon-colored mucosa consistent with columnar epithelium. Biopsies were obtained from that area at the conclusion of the procedure.  Beginning in the mid esophagus, I encountered particles of food debris. A Ross retrieval basket was used to remove some of this, including a somewhat larger piece of debris.  After passing the scope a couple of times to use the basket, I was able to advance the scope gently along side the residual material, down into the stomach. I then went back and forth, pushing residual pieces of food into the stomach.  Once the esophagus was cleared out, it was noted that there was some friability and mild transient  mucosal hemorrhage, but no evidence of any nodularity or mass effect or tumor. There were tongues of salmon-colored mucosa arising from the GE junction and extending proximally up the esophagus for several centimeters. This was felt to be consistent with classic Barrett's esophagus, and several biopsies were obtained.  I could not identify any discrete ring or stricture. There was no resistance to passage of the endoscope into the stomach.  A 3 cm hiatal hernia was present.  The stomach itself contained no blood or coffee-ground material and had normal mucosa without evidence of gastritis, erosions, ulcers, polyps, or masses. A retroflexed view of the cardia was normal.  The pylorus, duodenal bulb, and second duodenum looked normal.  Retroflexion, as noted, was unremarkable.         The scope was then withdrawn from the patient and the procedure completed. He tolerated the procedure quite well.  COMPLICATIONS: There were no immediate complications.  ENDOSCOPIC IMPRESSION: 1. Esophageal food impaction, successfully resolved by combination of extraction and pushing into the gastric lumen 2. No discrete esophageal mass or stricture identified. No evidence of malignancy. 3.  distal esophageal mucosal changes consistent with Barrett's esophagus, path pending 4. Proximal esophageal mucosal changes consistent with columnar mucosa, probably constituting an "inlet patch."  RECOMMENDATIONS: 1. Initiate PPI therapy. I would recommend omeprazole 20 mg twice a day for starters. 2. Await pathology results 3. Soft diet 4. Office follow-up in a month to monitor progress and consider esophageal dilatation depending on symptoms   _______________________________ eSigned:  Bernette Redbird, MD 11-10-14 2:01 PM     cc:  CPT CODES: ICD CODES:  The ICD and CPT codes recommended by this software are interpretations from the data that the clinical staff has captured with the software.   The verification of the translation of this report to the ICD and CPT codes and modifiers is the sole responsibility of the health care institution and practicing physician where this report was generated.  [employer] will not be held responsible for the validity of the ICD and CPT codes included on this report.  AMA assumes no liability for data contained or not contained herein. CPT is a Publishing rights manager of the [employer].  PATIENT NAME:  Wayne Mitchell, Wayne Mitchell MR#: [id number]

## 2014-10-23 NOTE — ED Notes (Signed)
The pt is asleep 

## 2014-10-25 ENCOUNTER — Encounter (HOSPITAL_COMMUNITY): Payer: Self-pay | Admitting: Gastroenterology

## 2014-10-25 DIAGNOSIS — K222 Esophageal obstruction: Secondary | ICD-10-CM | POA: Diagnosis not present

## 2014-11-10 ENCOUNTER — Other Ambulatory Visit: Payer: Self-pay | Admitting: Gastroenterology

## 2014-11-10 DIAGNOSIS — Z1211 Encounter for screening for malignant neoplasm of colon: Secondary | ICD-10-CM | POA: Diagnosis not present

## 2014-11-10 DIAGNOSIS — R1310 Dysphagia, unspecified: Secondary | ICD-10-CM | POA: Diagnosis not present

## 2014-11-10 DIAGNOSIS — K2270 Barrett's esophagus without dysplasia: Secondary | ICD-10-CM | POA: Diagnosis not present

## 2014-11-10 DIAGNOSIS — R4702 Dysphasia: Secondary | ICD-10-CM

## 2014-12-26 ENCOUNTER — Ambulatory Visit
Admission: RE | Admit: 2014-12-26 | Discharge: 2014-12-26 | Disposition: A | Discharge status: Home / Self Care | Payer: Commercial Managed Care - HMO | Source: Ambulatory Visit | Attending: Gastroenterology | Admitting: Gastroenterology

## 2014-12-26 DIAGNOSIS — R4702 Dysphasia: Secondary | ICD-10-CM

## 2014-12-26 DIAGNOSIS — R1310 Dysphagia, unspecified: Secondary | ICD-10-CM | POA: Diagnosis not present

## 2015-02-15 DIAGNOSIS — Z1389 Encounter for screening for other disorder: Secondary | ICD-10-CM | POA: Diagnosis not present

## 2015-02-15 DIAGNOSIS — K2270 Barrett's esophagus without dysplasia: Secondary | ICD-10-CM | POA: Diagnosis not present

## 2015-02-15 DIAGNOSIS — I10 Essential (primary) hypertension: Secondary | ICD-10-CM | POA: Diagnosis not present

## 2015-02-15 DIAGNOSIS — R7309 Other abnormal glucose: Secondary | ICD-10-CM | POA: Diagnosis not present

## 2015-02-15 DIAGNOSIS — D696 Thrombocytopenia, unspecified: Secondary | ICD-10-CM | POA: Diagnosis not present

## 2015-02-15 DIAGNOSIS — I6523 Occlusion and stenosis of bilateral carotid arteries: Secondary | ICD-10-CM | POA: Diagnosis not present

## 2015-02-15 DIAGNOSIS — Z0000 Encounter for general adult medical examination without abnormal findings: Secondary | ICD-10-CM | POA: Diagnosis not present

## 2015-02-15 DIAGNOSIS — E039 Hypothyroidism, unspecified: Secondary | ICD-10-CM | POA: Diagnosis not present

## 2015-02-15 DIAGNOSIS — E7800 Pure hypercholesterolemia, unspecified: Secondary | ICD-10-CM | POA: Diagnosis not present

## 2015-03-01 ENCOUNTER — Other Ambulatory Visit: Payer: Self-pay | Admitting: Internal Medicine

## 2015-03-01 DIAGNOSIS — I6523 Occlusion and stenosis of bilateral carotid arteries: Secondary | ICD-10-CM

## 2015-03-20 ENCOUNTER — Ambulatory Visit
Admission: RE | Admit: 2015-03-20 | Discharge: 2015-03-20 | Disposition: A | Discharge status: Home / Self Care | Payer: Commercial Managed Care - HMO | Source: Ambulatory Visit | Attending: Internal Medicine | Admitting: Internal Medicine

## 2015-03-20 DIAGNOSIS — I6523 Occlusion and stenosis of bilateral carotid arteries: Secondary | ICD-10-CM

## 2015-05-12 DIAGNOSIS — Z8249 Family history of ischemic heart disease and other diseases of the circulatory system: Secondary | ICD-10-CM | POA: Diagnosis not present

## 2015-05-12 DIAGNOSIS — Z87891 Personal history of nicotine dependence: Secondary | ICD-10-CM | POA: Diagnosis not present

## 2015-05-12 DIAGNOSIS — I10 Essential (primary) hypertension: Secondary | ICD-10-CM | POA: Diagnosis not present

## 2015-05-12 DIAGNOSIS — E890 Postprocedural hypothyroidism: Secondary | ICD-10-CM | POA: Diagnosis not present

## 2015-05-12 DIAGNOSIS — E0500 Thyrotoxicosis with diffuse goiter without thyrotoxic crisis or storm: Secondary | ICD-10-CM | POA: Diagnosis not present

## 2015-05-12 DIAGNOSIS — E7800 Pure hypercholesterolemia, unspecified: Secondary | ICD-10-CM | POA: Diagnosis not present

## 2015-05-12 DIAGNOSIS — Z9109 Other allergy status, other than to drugs and biological substances: Secondary | ICD-10-CM | POA: Diagnosis not present

## 2015-05-12 DIAGNOSIS — I471 Supraventricular tachycardia: Secondary | ICD-10-CM | POA: Diagnosis not present

## 2015-05-12 DIAGNOSIS — R55 Syncope and collapse: Secondary | ICD-10-CM | POA: Diagnosis not present

## 2015-05-12 DIAGNOSIS — Z6826 Body mass index (BMI) 26.0-26.9, adult: Secondary | ICD-10-CM | POA: Diagnosis not present

## 2015-05-12 DIAGNOSIS — Z79899 Other long term (current) drug therapy: Secondary | ICD-10-CM | POA: Diagnosis not present

## 2015-05-19 DIAGNOSIS — I10 Essential (primary) hypertension: Secondary | ICD-10-CM | POA: Insufficient documentation

## 2015-08-05 ENCOUNTER — Emergency Department (HOSPITAL_COMMUNITY): Payer: Commercial Managed Care - HMO

## 2015-08-05 ENCOUNTER — Encounter (HOSPITAL_COMMUNITY): Payer: Self-pay

## 2015-08-05 ENCOUNTER — Observation Stay (HOSPITAL_COMMUNITY)
Admission: EM | Admit: 2015-08-05 | Discharge: 2015-08-06 | Disposition: A | Discharge status: Home / Self Care | Payer: Commercial Managed Care - HMO | Attending: Internal Medicine | Admitting: Internal Medicine

## 2015-08-05 ENCOUNTER — Other Ambulatory Visit: Payer: Self-pay

## 2015-08-05 DIAGNOSIS — I129 Hypertensive chronic kidney disease with stage 1 through stage 4 chronic kidney disease, or unspecified chronic kidney disease: Secondary | ICD-10-CM | POA: Insufficient documentation

## 2015-08-05 DIAGNOSIS — I2510 Atherosclerotic heart disease of native coronary artery without angina pectoris: Secondary | ICD-10-CM | POA: Insufficient documentation

## 2015-08-05 DIAGNOSIS — I4510 Unspecified right bundle-branch block: Secondary | ICD-10-CM | POA: Insufficient documentation

## 2015-08-05 DIAGNOSIS — R55 Syncope and collapse: Secondary | ICD-10-CM | POA: Diagnosis not present

## 2015-08-05 DIAGNOSIS — I10 Essential (primary) hypertension: Secondary | ICD-10-CM | POA: Diagnosis not present

## 2015-08-05 DIAGNOSIS — R61 Generalized hyperhidrosis: Secondary | ICD-10-CM | POA: Diagnosis not present

## 2015-08-05 DIAGNOSIS — E039 Hypothyroidism, unspecified: Secondary | ICD-10-CM | POA: Diagnosis not present

## 2015-08-05 DIAGNOSIS — E860 Dehydration: Secondary | ICD-10-CM | POA: Diagnosis not present

## 2015-08-05 DIAGNOSIS — N182 Chronic kidney disease, stage 2 (mild): Secondary | ICD-10-CM | POA: Diagnosis not present

## 2015-08-05 DIAGNOSIS — Z7982 Long term (current) use of aspirin: Secondary | ICD-10-CM | POA: Diagnosis not present

## 2015-08-05 DIAGNOSIS — R404 Transient alteration of awareness: Secondary | ICD-10-CM | POA: Diagnosis not present

## 2015-08-05 DIAGNOSIS — R42 Dizziness and giddiness: Secondary | ICD-10-CM

## 2015-08-05 LAB — BASIC METABOLIC PANEL
Anion gap: 8 (ref 5–15)
BUN: 16 mg/dL (ref 6–20)
CALCIUM: 8.3 mg/dL — AB (ref 8.9–10.3)
CHLORIDE: 104 mmol/L (ref 101–111)
CO2: 23 mmol/L (ref 22–32)
CREATININE: 1.35 mg/dL — AB (ref 0.61–1.24)
GFR calc Af Amer: 57 mL/min — ABNORMAL LOW (ref >60)
GFR calc non Af Amer: 49 mL/min — ABNORMAL LOW (ref >60)
Glucose, Bld: 109 mg/dL — ABNORMAL HIGH (ref 65–99)
Potassium: 3.1 mmol/L — ABNORMAL LOW (ref 3.5–5.1)
SODIUM: 135 mmol/L (ref 135–145)

## 2015-08-05 LAB — CBC WITH DIFFERENTIAL/PLATELET
Basophils Absolute: 0 10*3/uL (ref 0.0–0.1)
Basophils Relative: 0 %
EOS ABS: 0.1 10*3/uL (ref 0.0–0.7)
EOS PCT: 1 %
HCT: 36.5 % — ABNORMAL LOW (ref 39.0–52.0)
Hemoglobin: 12.6 g/dL — ABNORMAL LOW (ref 13.0–17.0)
LYMPHS ABS: 1.7 10*3/uL (ref 0.7–4.0)
Lymphocytes Relative: 20 %
MCH: 30.5 pg (ref 26.0–34.0)
MCHC: 34.5 g/dL (ref 30.0–36.0)
MCV: 88.4 fL (ref 78.0–100.0)
MONOS PCT: 7 %
Monocytes Absolute: 0.6 10*3/uL (ref 0.1–1.0)
Neutro Abs: 6.2 10*3/uL (ref 1.7–7.7)
Neutrophils Relative %: 72 %
PLATELETS: 97 10*3/uL — AB (ref 150–400)
RBC: 4.13 MIL/uL — ABNORMAL LOW (ref 4.22–5.81)
RDW: 13.2 % (ref 11.5–15.5)
WBC: 8.6 10*3/uL (ref 4.0–10.5)

## 2015-08-05 LAB — I-STAT TROPONIN, ED: Troponin i, poc: 0 ng/mL (ref 0.00–0.08)

## 2015-08-05 MED ORDER — CARVEDILOL 3.125 MG PO TABS: 3.1250 mg | ORAL_TABLET | ORAL | Status: DC

## 2015-08-05 MED ORDER — AMLODIPINE BESYLATE 10 MG PO TABS
10.0000 mg | ORAL_TABLET | Freq: Every day | ORAL | Status: DC
  Administered 2015-08-06: 10 mg via ORAL
  Filled 2015-08-05: qty 1

## 2015-08-05 MED ORDER — CARVEDILOL 3.125 MG PO TABS: 3.1250 mg | ORAL_TABLET | Freq: Every day | ORAL | Status: DC

## 2015-08-05 MED ORDER — LEVOTHYROXINE SODIUM 112 MCG PO TABS
112.0000 ug | ORAL_TABLET | Freq: Every day | ORAL | Status: DC
  Administered 2015-08-06: 112 ug via ORAL
  Filled 2015-08-05: qty 1

## 2015-08-05 MED ORDER — CARVEDILOL 6.25 MG PO TABS
6.2500 mg | ORAL_TABLET | Freq: Every morning | ORAL | Status: DC
  Administered 2015-08-06: 6.25 mg via ORAL
  Filled 2015-08-05: qty 1

## 2015-08-05 MED ORDER — ASPIRIN EC 81 MG PO TBEC
81.0000 mg | DELAYED_RELEASE_TABLET | Freq: Every day | ORAL | Status: DC
  Administered 2015-08-06: 81 mg via ORAL
  Filled 2015-08-05: qty 1

## 2015-08-05 MED ORDER — SODIUM CHLORIDE 0.9 % IV BOLUS (SEPSIS)
1000.0000 mL | Freq: Once | INTRAVENOUS | Status: AC
  Administered 2015-08-05: 1000 mL via INTRAVENOUS

## 2015-08-05 MED ORDER — CHLORTHALIDONE 25 MG PO TABS
25.0000 mg | ORAL_TABLET | Freq: Every day | ORAL | Status: DC
  Filled 2015-08-05: qty 1

## 2015-08-05 MED ORDER — ATORVASTATIN CALCIUM 20 MG PO TABS
20.0000 mg | ORAL_TABLET | Freq: Every day | ORAL | Status: DC
  Administered 2015-08-06: 20 mg via ORAL
  Filled 2015-08-05 (×2): qty 1

## 2015-08-05 MED ORDER — SODIUM CHLORIDE 0.9% FLUSH
3.0000 mL | Freq: Two times a day (BID) | INTRAVENOUS | Status: DC
Start: 2015-08-05 — End: 2015-08-06
  Administered 2015-08-05: 3 mL via INTRAVENOUS

## 2015-08-05 MED ORDER — ASPIRIN EC 81 MG PO TBEC: 81.0000 mg | DELAYED_RELEASE_TABLET | Freq: Every day | ORAL | Status: DC
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
## 2015-08-05 NOTE — ED Notes (Signed)
Attempted Report x1.   
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
## 2015-08-05 NOTE — ED Notes (Signed)
Phlebotomy at the bedside  
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
## 2015-08-05 NOTE — ED Notes (Signed)
Patient returned from X-ray 

## 2015-08-06 ENCOUNTER — Observation Stay (HOSPITAL_BASED_OUTPATIENT_CLINIC_OR_DEPARTMENT_OTHER): Payer: Commercial Managed Care - HMO

## 2015-08-06 ENCOUNTER — Other Ambulatory Visit (HOSPITAL_COMMUNITY): Payer: Commercial Managed Care - HMO

## 2015-08-06 DIAGNOSIS — R55 Syncope and collapse: Secondary | ICD-10-CM

## 2015-08-06 DIAGNOSIS — E860 Dehydration: Secondary | ICD-10-CM | POA: Diagnosis not present

## 2015-08-06 LAB — BASIC METABOLIC PANEL
Anion gap: 8 (ref 5–15)
BUN: 16 mg/dL (ref 6–20)
CALCIUM: 8.7 mg/dL — AB (ref 8.9–10.3)
CHLORIDE: 105 mmol/L (ref 101–111)
CO2: 25 mmol/L (ref 22–32)
CREATININE: 1.35 mg/dL — AB (ref 0.61–1.24)
GFR calc non Af Amer: 49 mL/min — ABNORMAL LOW (ref >60)
GFR, EST AFRICAN AMERICAN: 57 mL/min — AB (ref >60)
GLUCOSE: 96 mg/dL (ref 65–99)
Potassium: 3.4 mmol/L — ABNORMAL LOW (ref 3.5–5.1)
Sodium: 138 mmol/L (ref 135–145)

## 2015-08-06 LAB — ECHOCARDIOGRAM COMPLETE
FS: 36 % (ref 28–44)
HEIGHTINCHES: 70 in
IVS/LV PW RATIO, ED: 0.94
LA diam index: 1.39 cm/m2
LA vol index: 16 mL/m2
LA vol: 32.3 mL
LASIZE: 28 mm
LAVOLA4C: 27.9 mL
LEFT ATRIUM END SYS DIAM: 28 mm
LVOT area: 4.15 cm2
LVOTD: 23 mm
P 1/2 time: 303 ms
PV Reg vel dias: 39.4 cm/s
PW: 9.89 mm — AB (ref 0.6–1.1)
TAPSE: 34.4 mm
Weight: 2960 oz

## 2015-08-06 LAB — CBC
HCT: 37 % — ABNORMAL LOW (ref 39.0–52.0)
Hemoglobin: 12.3 g/dL — ABNORMAL LOW (ref 13.0–17.0)
MCH: 29.2 pg (ref 26.0–34.0)
MCHC: 33.2 g/dL (ref 30.0–36.0)
MCV: 87.9 fL (ref 78.0–100.0)
PLATELETS: 100 10*3/uL — AB (ref 150–400)
RBC: 4.21 MIL/uL — ABNORMAL LOW (ref 4.22–5.81)
RDW: 13.3 % (ref 11.5–15.5)
WBC: 7.4 10*3/uL (ref 4.0–10.5)

## 2015-08-06 LAB — TSH: TSH: 2.498 u[IU]/mL (ref 0.350–4.500)

## 2015-08-06 MED ORDER — POTASSIUM CHLORIDE CRYS ER 20 MEQ PO TBCR
40.0000 meq | EXTENDED_RELEASE_TABLET | Freq: Once | ORAL | Status: AC
  Administered 2015-08-06: 40 meq via ORAL
  Filled 2015-08-06: qty 2

## 2015-08-06 NOTE — Progress Notes (Signed)
NURSING PROGRESS NOTE  Wayne Mitchell [id number] Discharge Data: 08/06/2015 12:40 PM Attending Provider: Jeralyn Bennett, MD [id number], MD     Dimas Aguas to be D/C'd Home per MD order.  Discussed with the patient the After Visit Summary and all questions fully answered. All IV's discontinued with no bleeding noted. All belongings returned to patient for patient to take home.   Last Vital Signs:  Blood pressure 141/58, pulse 73, temperature 98 F (36.7 C), temperature source Oral, resp. rate 16, height 5\' 10"  (1.778 m), weight 83.915 kg (185 lb), SpO2 97 %.  Discharge Medication List   Medication List    STOP taking these medications        chlorthalidone 25 MG tablet  Commonly known as:  HYGROTON      TAKE these medications        amLODipine 10 MG tablet  Commonly known as:  NORVASC  Take 10 mg by mouth daily.     aspirin 81 MG tablet  Take 81 mg by mouth daily.     atorvastatin 20 MG tablet  Commonly known as:  LIPITOR  Take 20 mg by mouth daily.     carvedilol 6.25 MG tablet  Commonly known as:  COREG  Take 3.125-6.25 mg by mouth See admin instructions. Take 1 tablet in the morning  and 0.5 tablet (3.125mg ) in the evening.     irbesartan 300 MG tablet  Commonly known as:  AVAPRO  Take 300 mg by mouth daily.     levothyroxine 112 MCG tablet  Commonly known as:  SYNTHROID, LEVOTHROID  Take 112 mcg by mouth daily before breakfast.

## 2015-08-06 NOTE — Progress Notes (Signed)
  Echocardiogram 2D Echocardiogram has been performed.  Wayne Mitchell 08/06/2015, 11:18 AM
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
## 2015-08-06 NOTE — Progress Notes (Signed)
PT Cancellation Note  Patient Details Name: Wayne Mitchell MRN: [id number] DOB: October 12, 1935   Cancelled Treatment:    Reason Eval/Treat Not Completed: PT screened, no needs identified, will sign off -- pt dressed and awaiting transport to d/c home.  Denies problems with mobility and feels ready to be home.  Thank you,    Dennis Bast 08/06/2015, 12:19 PM

## 2015-08-09 DIAGNOSIS — I10 Essential (primary) hypertension: Secondary | ICD-10-CM | POA: Diagnosis not present

## 2015-08-09 DIAGNOSIS — R55 Syncope and collapse: Secondary | ICD-10-CM | POA: Diagnosis not present

## 2015-08-09 DIAGNOSIS — E876 Hypokalemia: Secondary | ICD-10-CM | POA: Diagnosis not present

## 2015-08-09 DIAGNOSIS — E7800 Pure hypercholesterolemia, unspecified: Secondary | ICD-10-CM | POA: Diagnosis not present

## 2015-08-09 DIAGNOSIS — E039 Hypothyroidism, unspecified: Secondary | ICD-10-CM | POA: Diagnosis not present

## 2015-08-23 ENCOUNTER — Encounter: Payer: Self-pay | Admitting: Cardiology

## 2015-08-27 NOTE — Progress Notes (Signed)
Cardiology Office Note    Date:  08/28/2015   ID:  Wayne Mitchell, DOB 1935/06/02, MRN [id number]  PCP:  Georgann Housekeeper, MD  Cardiologist: Lesleigh Noe, MD   Chief Complaint  Patient presents with  . Loss of Consciousness    History of Present Illness:  Wayne Mitchell is a 80 y.o. male has a history of recurrent syncope, bilateral carotid disease, hypertension, and hyperlipidemia.  3 syncopal episodes over his lifespan. The first occurred in 2007 with a negative workup. It occurred after playing golf and he feels he was dehydrated. A second episode occurred 2 years ago. He was seen at [facility]. He relates this to drinking several glasses of cold suite eliminate very quickly. He shortly thereafter had diarrhea, and shortly after that he fainted. The third episode occurred 2 weeks ago when he fainted while sitting after having worked in his chart for approximately 3 hours. He became diaphoretic, had no nausea of vomiting, and fainted while sitting. He was admitted to [facility] for monitoring. Cardiac monitoring was performed. An echocardiogram demonstrated normal LV function.  He denies angina. No episodes of transient neurological complaints. No family history of coronary disease.   Past Medical History  Diagnosis Date  . Syncope   . HTN (hypertension)   . Carotid artery disease (HCC)   . Hypothyroidism   . Thrombocytopenia (HCC)   . Colon polyp     small  . Syncope   . Coronary artery disease     Past Surgical History  Procedure Laterality Date  . Cataract extraction Left   . Esophagogastroduodenoscopy N/A 10/23/2014    Procedure: ESOPHAGOGASTRODUODENOSCOPY (EGD);  Surgeon: Bernette Redbird, MD;  Location: [facility] ENDOSCOPY;  Service: Endoscopy;  Laterality: N/A;    Current Medications: Outpatient Prescriptions Prior to Visit  Medication Sig Dispense Refill  . amLODipine (NORVASC) 10 MG tablet Take 10 mg by mouth daily.    Marland Kitchen atorvastatin (LIPITOR) 20 MG  tablet Take 20 mg by mouth daily.    . carvedilol (COREG) 6.25 MG tablet Take 3.125-6.25 mg by mouth 2 (two) times daily with a meal. Take 1 tablet in the morning  and 0.5 tablet (3.125mg ) in the evening.    . irbesartan (AVAPRO) 300 MG tablet Take 300 mg by mouth daily.    Marland Kitchen levothyroxine (SYNTHROID, LEVOTHROID) 112 MCG tablet Take 112 mcg by mouth daily before breakfast.     No facility-administered medications prior to visit.     Allergies:   Lisinopril   Social History   Social History  . Marital Status: Married    Spouse Name: N/A  . Number of Children: N/A  . Years of Education: N/A   Social History Main Topics  . Smoking status: Never Smoker   . Smokeless tobacco: None  . Alcohol Use: 2.4 oz/week    4 Cans of beer per week  . Drug Use: No  . Sexual Activity: Not Asked   Other Topics Concern  . None   Social History Narrative     Family History:  The patient's family history includes Pancreatic cancer in his mother.   ROS:   Please see the history of present illness.    Decreased memory, alcohol use, otherwise unremarkable.  All other systems reviewed and are negative.   PHYSICAL EXAM:   VS:  BP 122/58 mmHg  Pulse 42  Ht 5\' 10"  (1.778 m)  Wt 171 lb (77.565 kg)  BMI 24.54 kg/m2   GEN: Well  nourished, well developed, in no acute distress HEENT: normal Neck: no JVD, or masses. High-pitched bruit right carotid area. Cardiac: RRR; no murmurs, rubs, or gallops,no edema  Respiratory:  clear to auscultation bilaterally, normal work of breathing GI: soft, nontender, nondistended, + BS MS: no deformity or atrophy Skin: warm and dry, no rash Neuro:  Alert and Oriented x 3, Strength and sensation are intact Psych: euthymic mood, full affect  Wt Readings from Last 3 Encounters:  08/28/15 171 lb (77.565 kg)  08/05/15 185 lb (83.915 kg)      Studies/Labs Reviewed:   EKG:  EKG  Marked sinus bradycardia with rate of 42 bpm, and occasional PAC.  Recent  Labs: 10/22/2014: ALT 18 08/06/2015: BUN 16; Creatinine, Ser 1.35*; Hemoglobin 12.3*; Platelets 100*; Potassium 3.4*; Sodium 138; TSH 2.498   Lipid Panel No results found for: CHOL, TRIG, HDL, CHOLHDL, VLDL, LDLCALC, LDLDIRECT  Additional studies/ records that were reviewed today include:  Reviewed echo from June that revealed normal LV function and mild to moderate aortic regurgitation.  Reviewed rhythm strips from recent hospital stay on 08/05/15: No significant bradycardia was noted.   ASSESSMENT:    1. Syncope, unspecified syncope type   2. Bradycardia   3. Bilateral carotid artery disease (HCC)   4. Essential (primary) hypertension   5. Supraventricular tachycardia (HCC)   6. Syncope, vasovagal      PLAN:  In order of problems listed above:  1. No specific diagnosis is been made. Perhaps he has neurally mediated syncope. Perhaps each episode was related to dehydration. It is unclear. No significant prior workup. 2. Noted on today's EKG. The patient is on beta blocker therapy. Though his heart rate is 42 he feels well. Discontinue beta blocker therapy and performed 48 hour Holter monitor at least one week after education discontinuation. 3. Loud right carotid bruit. I reviewed February Doppler studies which revealed significant obstruction in the right external carotid. Aspirin 81 mg per day and statin therapy. 4. The blood pressure is well controlled.    Medication Adjustments/Labs and Tests Ordered: Current medicines are reviewed at length with the patient today.  Concerns regarding medicines are outlined above.  Medication changes, Labs and Tests ordered today are listed in the Patient Instructions below. Patient Instructions  Medication Instructions:    Labwork:   Testing/Procedures:   Follow-Up:   Any Other Special Instructions Will Be Listed Below (If Applicable).     If you need a refill on your cardiac medications before your next appointment, please  call your pharmacy.       Signed, Lesleigh Noe, MD  08/28/2015 9:47 AM    [facility] [address] [place], [place], [place]  [address] Phone: [phone]; Fax: [phone]

## 2015-08-28 ENCOUNTER — Ambulatory Visit (INDEPENDENT_AMBULATORY_CARE_PROVIDER_SITE_OTHER): Payer: Commercial Managed Care - HMO | Admitting: Interventional Cardiology

## 2015-08-28 ENCOUNTER — Encounter: Payer: Self-pay | Admitting: Interventional Cardiology

## 2015-08-28 VITALS — BP 122/58 | HR 42 | Ht 70.0 in | Wt 171.0 lb

## 2015-08-28 DIAGNOSIS — I779 Disorder of arteries and arterioles, unspecified: Secondary | ICD-10-CM | POA: Diagnosis not present

## 2015-08-28 DIAGNOSIS — R001 Bradycardia, unspecified: Secondary | ICD-10-CM | POA: Diagnosis not present

## 2015-08-28 DIAGNOSIS — I10 Essential (primary) hypertension: Secondary | ICD-10-CM

## 2015-08-28 DIAGNOSIS — D696 Thrombocytopenia, unspecified: Secondary | ICD-10-CM | POA: Insufficient documentation

## 2015-08-28 DIAGNOSIS — R55 Syncope and collapse: Secondary | ICD-10-CM | POA: Diagnosis not present

## 2015-08-28 DIAGNOSIS — I471 Supraventricular tachycardia: Secondary | ICD-10-CM

## 2015-08-28 DIAGNOSIS — I739 Peripheral vascular disease, unspecified: Secondary | ICD-10-CM

## 2015-08-28 MED ORDER — ASPIRIN EC 81 MG PO TBEC: 81.0000 mg | DELAYED_RELEASE_TABLET | Freq: Every day | ORAL | Status: DC

## 2015-08-28 NOTE — Patient Instructions (Addendum)
Medication Instructions:  1) START Aspirin 81mg  daily 2) STOP Carvedilol   Labwork: None ordered  Testing/Procedures: Your physician has recommended that you wear a holter monitor. Holter monitors are medical devices that record the heart's electrical activity. Doctors most often use these monitors to diagnose arrhythmias. Arrhythmias are problems with the speed or rhythm of the heartbeat. The monitor is a small, portable device. You can wear one while you do your normal daily activities. This is usually used to diagnose what is causing palpitations/syncope (passing out). (To be scheduled in 7-10 days)  Follow-Up: Your physician wants you to follow-up in: 1 year with Dr.Smith You will receive a reminder letter in the mail two months in advance. If you don't receive a letter, please call our office to schedule the follow-up appointment.    Any Other Special Instructions Will Be Listed Below (If Applicable).     If you need a refill on your cardiac medications before your next appointment, please call your pharmacy.

## 2015-09-07 ENCOUNTER — Ambulatory Visit (INDEPENDENT_AMBULATORY_CARE_PROVIDER_SITE_OTHER): Payer: Commercial Managed Care - HMO

## 2015-09-07 DIAGNOSIS — R001 Bradycardia, unspecified: Secondary | ICD-10-CM | POA: Diagnosis not present

## 2015-09-07 DIAGNOSIS — R55 Syncope and collapse: Secondary | ICD-10-CM | POA: Diagnosis not present

## 2015-09-19 ENCOUNTER — Ambulatory Visit: Payer: Commercial Managed Care - HMO | Admitting: Cardiology

## 2015-10-03 ENCOUNTER — Telehealth: Payer: Self-pay | Admitting: Interventional Cardiology

## 2015-10-03 NOTE — Telephone Encounter (Signed)
Spoke with pt, aware of monitor results. Follow up scheduled

## 2015-10-03 NOTE — Telephone Encounter (Signed)
Follow Up ° ° ° °Pt is returning call from a few days ago. Please call. °

## 2015-10-19 NOTE — Progress Notes (Signed)
Cardiology Office Note    Date:  10/20/2015   ID:  Wayne Mitchell, DOB 15-Dec-1935, MRN [id number]  PCP:  Georgann Housekeeper, MD  Cardiologist: Lesleigh Noe, MD   Chief Complaint  Patient presents with  . Loss of Consciousness    History of Present Illness:  Wayne Mitchell is a 80 y.o. male for follow-up with history of hypertension, and recent episode of syncope.  Episode of syncope occurred after a long day working in his jaw with inadequate fluid intake and diffuse sweating. 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 His wife awakened him and took him inside the house and he fainted again. He then laid on the floor and have profuse sweating.  Upon office evaluation last month I felt this episode likely represented dehydration or a vasovagal episode. Noted at that time was severe bradycardia on beta blocker therapy with heart rate less than 50 bpm. Beta blocker therapy was discontinued. Monitoring was done for 48 hours and did not demonstrate any excessive bradycardia or sustained tachycardia. He's had no recurrent syncopal episodes or dizziness in the interval since his faint.    Past Medical History:  Diagnosis Date  . Carotid artery disease (HCC)   . Colon polyp    small  . Coronary artery disease   . HTN (hypertension)   . Hypothyroidism   . Syncope   . Syncope   . Thrombocytopenia (HCC)     Past Surgical History:  Procedure Laterality Date  . CATARACT EXTRACTION Left   . ESOPHAGOGASTRODUODENOSCOPY N/A 10/23/2014   Procedure: ESOPHAGOGASTRODUODENOSCOPY (EGD);  Surgeon: Bernette Redbird, MD;  Location: [facility] ENDOSCOPY;  Service: Endoscopy;  Laterality: N/A;    Current Medications: Outpatient Medications Prior to Visit  Medication Sig Dispense Refill  . amLODipine (NORVASC) 10 MG tablet Take 10 mg by mouth daily.    Marland Kitchen aspirin EC 81 MG tablet Take 1 tablet (81 mg total) by mouth daily.    Marland Kitchen atorvastatin (LIPITOR) 20 MG  tablet Take 20 mg by mouth daily.    . irbesartan (AVAPRO) 300 MG tablet Take 300 mg by mouth daily.    Marland Kitchen levothyroxine (SYNTHROID, LEVOTHROID) 112 MCG tablet Take 112 mcg by mouth daily before breakfast.     No facility-administered medications prior to visit.      Allergies:   Lisinopril   Social History   Social History  . Marital status: Married    Spouse name: N/A  . Number of children: N/A  . Years of education: N/A   Social History Main Topics  . Smoking status: Never Smoker  . Smokeless tobacco: Never Used  . Alcohol use 2.4 oz/week    4 Cans of beer per week  . Drug use: No  . Sexual activity: Not Asked   Other Topics Concern  . None   Social History Narrative  . None     Family History:  The patient's family history includes Pancreatic cancer in his mother.   ROS:   Please see the history of present illness.    No complaints  All other systems reviewed and are negative.   PHYSICAL EXAM:   VS:  BP (!) 156/62   Pulse 83   Ht 5\' 7"  (1.702 m)   Wt 171 lb 3.2 oz (77.7 kg)   BMI 26.81 kg/m    GEN: Well nourished, well developed, in no acute distress  HEENT: normal  Neck: no JVD, carotid  bruits, or masses Cardiac: RRR; no murmurs, rubs, or gallops,no edema  Respiratory:  clear to auscultation bilaterally, normal work of breathing GI: soft, nontender, nondistended, + BS MS: no deformity or atrophy  Skin: warm and dry, no rash Neuro:  Alert and Oriented x 3, Strength and sensation are intact Psych: euthymic mood, full affect  Wt Readings from Last 3 Encounters:  10/20/15 171 lb 3.2 oz (77.7 kg)  08/28/15 171 lb (77.6 kg)  08/05/15 185 lb (83.9 kg)      Studies/Labs Reviewed:   EKG:  EKG  None  Recent Labs: 10/22/2014: ALT 18 08/06/2015: BUN 16; Creatinine, Ser 1.35; Hemoglobin 12.3; Platelets 100; Potassium 3.4; Sodium 138; TSH 2.498   Lipid Panel No results found for: CHOL, TRIG, HDL, CHOLHDL, VLDL, LDLCALC, LDLDIRECT  Additional studies/  records that were reviewed today include:  Holter Monitor 08/2015: Study Highlights     Basic underlying rhythm is normal sinus rhythm.  Occasional runs of junctional tachycardia into the 130 to 140 beat per minute range  Sinus bradycardia with pauses less than 3 seconds while asleep.  No obvious atrial fibrillation is identified.  No reported symptoms.   Abnormal due to the presence of etc. junctional rhythm at heart rates between 130 and 140 bpm. The arrhythmia was unaccompanied by symptoms. No atrial fibrillation is identified. The basic underlying rhythm is normal sinus rhythm.       ASSESSMENT:    1. Vasovagal syncope   2. Essential (primary) hypertension   3. Bilateral carotid artery disease (HCC)   4. Supraventricular tachycardia (HCC)      PLAN:  In order of problems listed above:  1. No recurrence. No abnormalities noted on monitoring. 2. Off beta blocker the blood pressure is slightly higher although not pattern enough to add anything at this point. We will continue to monitor. If consistently above 140/90 mmHg, we will add low dose hydrochlorothiazide to Avapro. 3. Not addressed 4. No sustained arrhythmia.    Medication Adjustments/Labs and Tests Ordered: Current medicines are reviewed at length with the patient today.  Concerns regarding medicines are outlined above.  Medication changes, Labs and Tests ordered today are listed in the Patient Instructions below. Patient Instructions  Your physician recommends that you continue on your current medications as directed. Please refer to the Current Medication list given to you today.  Please call Dr. Katrinka BlazingSmith in 3-4 weeks with your blood pressure readings, or send them through a MyChart message.  Your physician wants you to follow-up in: 6 months with Dr. Katrinka BlazingSmith.  You will receive a reminder letter in the mail two months in advance. If you don't receive a letter, please call our office to schedule the follow-up  appointment.     Signed, Lesleigh NoeHenry W Jaid Quirion III, MD  10/20/2015 3:55 PM    [facility] [address], [place], [place]  [address] Phone: [phone]; Fax: [phone]

## 2015-10-20 ENCOUNTER — Encounter: Payer: Self-pay | Admitting: Interventional Cardiology

## 2015-10-20 ENCOUNTER — Ambulatory Visit (INDEPENDENT_AMBULATORY_CARE_PROVIDER_SITE_OTHER): Payer: Commercial Managed Care - HMO | Admitting: Interventional Cardiology

## 2015-10-20 VITALS — BP 156/62 | HR 83 | Ht 67.0 in | Wt 171.2 lb

## 2015-10-20 DIAGNOSIS — I779 Disorder of arteries and arterioles, unspecified: Secondary | ICD-10-CM | POA: Diagnosis not present

## 2015-10-20 DIAGNOSIS — R55 Syncope and collapse: Secondary | ICD-10-CM

## 2015-10-20 DIAGNOSIS — I10 Essential (primary) hypertension: Secondary | ICD-10-CM

## 2015-10-20 DIAGNOSIS — I739 Peripheral vascular disease, unspecified: Secondary | ICD-10-CM

## 2015-10-20 DIAGNOSIS — I471 Supraventricular tachycardia: Secondary | ICD-10-CM | POA: Diagnosis not present

## 2015-10-20 NOTE — Patient Instructions (Signed)
Your physician recommends that you continue on your current medications as directed. Please refer to the Current Medication list given to you today.  Please call Dr. Katrinka Blazing in 3-4 weeks with your blood pressure readings, or send them through a MyChart message.  Your physician wants you to follow-up in: 6 months with Dr. Katrinka Blazing.  You will receive a reminder letter in the mail two months in advance. If you don't receive a letter, please call our office to schedule the follow-up appointment.

## 2016-02-09 DIAGNOSIS — J069 Acute upper respiratory infection, unspecified: Secondary | ICD-10-CM | POA: Diagnosis not present

## 2016-02-09 DIAGNOSIS — J111 Influenza due to unidentified influenza virus with other respiratory manifestations: Secondary | ICD-10-CM | POA: Diagnosis not present

## 2016-02-21 DIAGNOSIS — E039 Hypothyroidism, unspecified: Secondary | ICD-10-CM | POA: Diagnosis not present

## 2016-02-21 DIAGNOSIS — D696 Thrombocytopenia, unspecified: Secondary | ICD-10-CM | POA: Diagnosis not present

## 2016-02-21 DIAGNOSIS — R7303 Prediabetes: Secondary | ICD-10-CM | POA: Diagnosis not present

## 2016-02-21 DIAGNOSIS — K2270 Barrett's esophagus without dysplasia: Secondary | ICD-10-CM | POA: Diagnosis not present

## 2016-02-21 DIAGNOSIS — Z23 Encounter for immunization: Secondary | ICD-10-CM | POA: Diagnosis not present

## 2016-02-21 DIAGNOSIS — Z0000 Encounter for general adult medical examination without abnormal findings: Secondary | ICD-10-CM | POA: Diagnosis not present

## 2016-02-21 DIAGNOSIS — I10 Essential (primary) hypertension: Secondary | ICD-10-CM | POA: Diagnosis not present

## 2016-02-21 DIAGNOSIS — N183 Chronic kidney disease, stage 3 (moderate): Secondary | ICD-10-CM | POA: Diagnosis not present

## 2016-02-21 DIAGNOSIS — E7800 Pure hypercholesterolemia, unspecified: Secondary | ICD-10-CM | POA: Diagnosis not present

## 2016-02-21 DIAGNOSIS — I779 Disorder of arteries and arterioles, unspecified: Secondary | ICD-10-CM | POA: Diagnosis not present

## 2016-02-21 DIAGNOSIS — Z1389 Encounter for screening for other disorder: Secondary | ICD-10-CM | POA: Diagnosis not present

## 2016-02-21 DIAGNOSIS — Z125 Encounter for screening for malignant neoplasm of prostate: Secondary | ICD-10-CM | POA: Diagnosis not present

## 2016-02-21 NOTE — Progress Notes (Signed)
Cardiology Office Note   Date:  02/22/2016   ID:  Wayne Mitchell, DOB [birth year], MRN [id number]  PCP:  Georgann Housekeeper, MD  Cardiologist:  Dr. Katrinka Blazing    Chief Complaint  Patient presents with  . Hypertension    hx of syncope      History of Present Illness: Wayne Mitchell is a 81 y.o. male who presents for follow up.  He denies chest pain or SOB and no further episodes of syncope.    Episode of syncope occurred after a long day working in his jaw with inadequate fluid intake and diffuse sweating. 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 His wife awakened him and took him inside the house and he fainted again. He then laid on the floor and have profuse sweating.  Upon office evaluation last month Dr. Katrinka Blazing felt this episode likely represented dehydration or a vasovagal episode. Noted at that time was severe bradycardia on beta blocker therapy with heart rate less than 50 bpm. Beta blocker therapy was discontinued. Monitoring was done for 48 hours and did not demonstrate any excessive bradycardia or sustained tachycardia. He's had no recurrent syncopal episodes or dizziness in the interval since his faint.  Today he has no complaints.  His BP has been controlled - on last PCP visit it was 128/74.  At home in early Dec. It was 150 systolic but not since.     Past Medical History:  Diagnosis Date  . Carotid artery disease (HCC)   . Colon polyp    small  . Coronary artery disease   . HTN (hypertension)   . Hypothyroidism   . Syncope   . Syncope   . Thrombocytopenia (HCC)     Past Surgical History:  Procedure Laterality Date  . CATARACT EXTRACTION Left   . ESOPHAGOGASTRODUODENOSCOPY N/A 10/23/2014   Procedure: ESOPHAGOGASTRODUODENOSCOPY (EGD);  Surgeon: Bernette Redbird, MD;  Location: [facility] ENDOSCOPY;  Service: Endoscopy;  Laterality: N/A;     Current Outpatient Prescriptions  Medication Sig Dispense Refill  . amLODipine  (NORVASC) 10 MG tablet Take 10 mg by mouth daily.    Marland Kitchen aspirin EC 81 MG tablet Take 1 tablet (81 mg total) by mouth daily.    Marland Kitchen atorvastatin (LIPITOR) 20 MG tablet Take 20 mg by mouth daily.    . irbesartan (AVAPRO) 300 MG tablet Take 300 mg by mouth daily.    Marland Kitchen levothyroxine (SYNTHROID, LEVOTHROID) 112 MCG tablet Take 112 mcg by mouth daily before breakfast.     No current facility-administered medications for this visit.     Allergies:   Lisinopril    Social History:  The patient  reports that he has never smoked. He has never used smokeless tobacco. He reports that he drinks about 2.4 oz of alcohol per week . He reports that he does not use drugs.   Family History:  The patient's family history includes Pancreatic cancer in his mother.    ROS:  General:no colds or fevers, no weight changes Skin:no rashes or ulcers HEENT:no blurred vision, no congestion CV:see HPI PUL:see HPI GI:no diarrhea constipation or melena, no indigestion GU:no hematuria, no dysuria MS:no joint pain, no claudication Neuro:no syncope, no lightheadedness Endo:no diabetes, no thyroid disease   Wt Readings from Last 3 Encounters:  02/22/16 171 lb (77.6 kg)  10/20/15 171 lb 3.2 oz (77.7 kg)  08/28/15 171 lb (77.6 kg)     PHYSICAL EXAM: VS:  BP 130/64  Pulse 62   Ht 5\' 7"  (1.702 m)   Wt 171 lb (77.6 kg)   BMI 26.78 kg/m  , BMI Body mass index is 26.78 kg/m. General:Pleasant affect, NAD Skin:Warm and dry, brisk capillary refill HEENT:normocephalic, sclera clear, mucus membranes moist Neck:supple, no JVD, no bruits  Heart:S1S2 RRR without murmur, gallup, rub or click Lungs:clear without rales, rhonchi, or wheezes SUP:JSRP, non tender, + BS, do not palpate liver spleen or masses Ext:no lower ext edema, 2+ pedal pulses, 2+ radial pulses Neuro:alert and oriented, MAE, follows commands, + facial symmetry    EKG:  EKG is ordered today. The ekg ordered today demonstrates SR with RBBB no acute  changes.     Recent Labs: 08/06/2015: BUN 16; Creatinine, Ser 1.35; Hemoglobin 12.3; Platelets 100; Potassium 3.4; Sodium 138; TSH 2.498    Lipid Panel No results found for: CHOL, TRIG, HDL, CHOLHDL, VLDL, LDLCALC, LDLDIRECT     Other studies Reviewed: Additional studies/ records that were reviewed today include: . ECHO: 08/06/15 Study Conclusions  - Left ventricle: The cavity size was normal. Wall thickness was   normal. Systolic function was normal. The estimated ejection   fraction was in the range of 55% to 60%. Wall motion was normal;   there were no regional wall motion abnormalities. Left   ventricular diastolic function parameters were normal. - Aortic valve: There was mild to moderate regurgitation. Valve   area (VTI): 3.07 cm^2. Valve area (Vmax): 3.32 cm^2. - Technically adequate study.   ASSESSMENT AND PLAN:  1.  HTN controlled  2. Vasovagal syncope no further episodes- follow up with Dr. Katrinka Blazing in 6 months.   3. SVT no episodes.  4. Carotid disease.   Per Dr. Katrinka Blazing "If consistently above 140/90 mmHg, we will add low dose hydrochlorothiazide to Avapro"  Current medicines are reviewed with the patient today.  The patient Has no concerns regarding medicines.  The following changes have been made:  See above Labs/ tests ordered today include:see above  Disposition:   FU:  see above  Signed, Nada Boozer, NP  02/22/2016 1:35 PM    [facility] [address] [place], [place], [place]  [address]/ [address] [place], [place] Phone: [phone]; Fax: [phone]  [phone]

## 2016-02-22 ENCOUNTER — Ambulatory Visit (INDEPENDENT_AMBULATORY_CARE_PROVIDER_SITE_OTHER): Payer: Medicare HMO | Admitting: Cardiology

## 2016-02-22 ENCOUNTER — Encounter: Payer: Self-pay | Admitting: Cardiology

## 2016-02-22 VITALS — BP 130/64 | HR 62 | Ht 67.0 in | Wt 171.0 lb

## 2016-02-22 DIAGNOSIS — I10 Essential (primary) hypertension: Secondary | ICD-10-CM

## 2016-02-22 DIAGNOSIS — R001 Bradycardia, unspecified: Secondary | ICD-10-CM

## 2016-02-22 DIAGNOSIS — R55 Syncope and collapse: Secondary | ICD-10-CM | POA: Diagnosis not present

## 2016-02-22 DIAGNOSIS — I471 Supraventricular tachycardia: Secondary | ICD-10-CM

## 2016-02-22 NOTE — Patient Instructions (Signed)
Medication Instructions:  Your physician recommends that you continue on your current medications as directed. Please refer to the Current Medication list given to you today.   Labwork: None ordered  Testing/Procedures: None ordered  Follow-Up: Your physician wants you to follow-up in: 6 months with Dr. Smith. You will receive a reminder letter in the mail two months in advance. If you don't receive a letter, please call our office to schedule the follow-up appointment.   Any Other Special Instructions Will Be Listed Below (If Applicable).     If you need a refill on your cardiac medications before your next appointment, please call your pharmacy.   

## 2016-03-14 DIAGNOSIS — Z1211 Encounter for screening for malignant neoplasm of colon: Secondary | ICD-10-CM | POA: Diagnosis not present

## 2016-03-14 DIAGNOSIS — Z1212 Encounter for screening for malignant neoplasm of rectum: Secondary | ICD-10-CM | POA: Diagnosis not present

## 2016-04-24 DIAGNOSIS — H6120 Impacted cerumen, unspecified ear: Secondary | ICD-10-CM | POA: Diagnosis not present

## 2016-04-24 DIAGNOSIS — I10 Essential (primary) hypertension: Secondary | ICD-10-CM | POA: Diagnosis not present

## 2016-05-16 DIAGNOSIS — I10 Essential (primary) hypertension: Secondary | ICD-10-CM | POA: Diagnosis not present

## 2016-05-16 DIAGNOSIS — H6123 Impacted cerumen, bilateral: Secondary | ICD-10-CM | POA: Diagnosis not present

## 2016-07-17 DIAGNOSIS — H5211 Myopia, right eye: Secondary | ICD-10-CM | POA: Diagnosis not present

## 2016-07-17 DIAGNOSIS — H02401 Unspecified ptosis of right eyelid: Secondary | ICD-10-CM | POA: Diagnosis not present

## 2016-07-17 DIAGNOSIS — H2511 Age-related nuclear cataract, right eye: Secondary | ICD-10-CM | POA: Diagnosis not present

## 2016-07-17 DIAGNOSIS — H43813 Vitreous degeneration, bilateral: Secondary | ICD-10-CM | POA: Diagnosis not present

## 2016-08-08 DIAGNOSIS — H25011 Cortical age-related cataract, right eye: Secondary | ICD-10-CM | POA: Diagnosis not present

## 2016-08-08 DIAGNOSIS — H25811 Combined forms of age-related cataract, right eye: Secondary | ICD-10-CM | POA: Diagnosis not present

## 2016-08-08 DIAGNOSIS — H2511 Age-related nuclear cataract, right eye: Secondary | ICD-10-CM | POA: Diagnosis not present

## 2016-08-23 DIAGNOSIS — R1310 Dysphagia, unspecified: Secondary | ICD-10-CM | POA: Diagnosis not present

## 2016-08-23 DIAGNOSIS — K219 Gastro-esophageal reflux disease without esophagitis: Secondary | ICD-10-CM | POA: Diagnosis not present

## 2016-09-02 ENCOUNTER — Ambulatory Visit: Payer: Medicare HMO | Admitting: Interventional Cardiology

## 2016-09-16 DIAGNOSIS — I10 Essential (primary) hypertension: Secondary | ICD-10-CM | POA: Diagnosis not present

## 2016-09-16 DIAGNOSIS — N183 Chronic kidney disease, stage 3 (moderate): Secondary | ICD-10-CM | POA: Diagnosis not present

## 2016-09-16 DIAGNOSIS — R7303 Prediabetes: Secondary | ICD-10-CM | POA: Diagnosis not present

## 2016-09-16 DIAGNOSIS — E7800 Pure hypercholesterolemia, unspecified: Secondary | ICD-10-CM | POA: Diagnosis not present

## 2016-09-16 DIAGNOSIS — I2510 Atherosclerotic heart disease of native coronary artery without angina pectoris: Secondary | ICD-10-CM | POA: Diagnosis not present

## 2016-09-16 DIAGNOSIS — K2270 Barrett's esophagus without dysplasia: Secondary | ICD-10-CM | POA: Diagnosis not present

## 2016-09-16 DIAGNOSIS — E039 Hypothyroidism, unspecified: Secondary | ICD-10-CM | POA: Diagnosis not present

## 2016-09-16 DIAGNOSIS — D696 Thrombocytopenia, unspecified: Secondary | ICD-10-CM | POA: Diagnosis not present

## 2017-04-15 DIAGNOSIS — Z125 Encounter for screening for malignant neoplasm of prostate: Secondary | ICD-10-CM | POA: Diagnosis not present

## 2017-04-15 DIAGNOSIS — N183 Chronic kidney disease, stage 3 (moderate): Secondary | ICD-10-CM | POA: Diagnosis not present

## 2017-04-15 DIAGNOSIS — R739 Hyperglycemia, unspecified: Secondary | ICD-10-CM | POA: Diagnosis not present

## 2017-04-15 DIAGNOSIS — K2270 Barrett's esophagus without dysplasia: Secondary | ICD-10-CM | POA: Diagnosis not present

## 2017-04-15 DIAGNOSIS — Z0000 Encounter for general adult medical examination without abnormal findings: Secondary | ICD-10-CM | POA: Diagnosis not present

## 2017-04-15 DIAGNOSIS — D696 Thrombocytopenia, unspecified: Secondary | ICD-10-CM | POA: Diagnosis not present

## 2017-04-15 DIAGNOSIS — K219 Gastro-esophageal reflux disease without esophagitis: Secondary | ICD-10-CM | POA: Diagnosis not present

## 2017-04-15 DIAGNOSIS — E7800 Pure hypercholesterolemia, unspecified: Secondary | ICD-10-CM | POA: Diagnosis not present

## 2017-04-15 DIAGNOSIS — I779 Disorder of arteries and arterioles, unspecified: Secondary | ICD-10-CM | POA: Diagnosis not present

## 2017-04-15 DIAGNOSIS — E039 Hypothyroidism, unspecified: Secondary | ICD-10-CM | POA: Diagnosis not present

## 2017-04-15 DIAGNOSIS — Z1389 Encounter for screening for other disorder: Secondary | ICD-10-CM | POA: Diagnosis not present

## 2017-04-15 DIAGNOSIS — I10 Essential (primary) hypertension: Secondary | ICD-10-CM | POA: Diagnosis not present

## 2017-05-12 ENCOUNTER — Telehealth: Payer: Self-pay | Admitting: Interventional Cardiology

## 2017-05-12 DIAGNOSIS — R079 Chest pain, unspecified: Secondary | ICD-10-CM | POA: Diagnosis not present

## 2017-05-12 DIAGNOSIS — K2270 Barrett's esophagus without dysplasia: Secondary | ICD-10-CM | POA: Diagnosis not present

## 2017-05-12 DIAGNOSIS — I2510 Atherosclerotic heart disease of native coronary artery without angina pectoris: Secondary | ICD-10-CM | POA: Diagnosis not present

## 2017-05-12 DIAGNOSIS — R1310 Dysphagia, unspecified: Secondary | ICD-10-CM | POA: Diagnosis not present

## 2017-05-12 DIAGNOSIS — K219 Gastro-esophageal reflux disease without esophagitis: Secondary | ICD-10-CM | POA: Diagnosis not present

## 2017-05-12 NOTE — Telephone Encounter (Signed)
° °  [facility] Pre-operative Risk Assessment    Request for surgical clearance:  1. What type of surgery is being performed? Scope of esophagus    2. When is this surgery scheduled? Not scheduled   3. What type of clearance is required (medical clearance vs. Pharmacy clearance to hold med vs. Both)? Both   4. Are there any medications that need to be held prior to surgery and how long? Unknown    5. Practice name and name of physician performing surgery? [facility]  // Dr. Cristina Gong  What is your office phone and fax number? Office # [phone] Fax # [phone]  6. Anesthesia type (None, local, MAC, general) ?    Marjean Donna 05/12/2017, 1:58 PM  _________________________________________________________________   (provider comments below)

## 2017-05-15 NOTE — Telephone Encounter (Signed)
Sent patient a message through [facility] advising him to contact office to schedule and appt.

## 2017-05-15 NOTE — Telephone Encounter (Addendum)
I was unable to contact the patient, phone number is not in service. If he is able to be contacted and has been asymptomatic I'm not sure he needs to come for clearance. I would ask Dr Katrinka Blazing about that first.   Corine Shelter PA-C 05/15/2017 1:49 PM

## 2017-05-28 ENCOUNTER — Encounter: Payer: Self-pay | Admitting: Cardiology

## 2017-05-28 ENCOUNTER — Ambulatory Visit: Payer: Medicare HMO | Admitting: Cardiology

## 2017-05-28 ENCOUNTER — Encounter: Payer: Self-pay | Admitting: *Deleted

## 2017-05-28 VITALS — BP 144/62 | HR 95 | Ht 67.0 in | Wt 164.0 lb

## 2017-05-28 DIAGNOSIS — I10 Essential (primary) hypertension: Secondary | ICD-10-CM | POA: Diagnosis not present

## 2017-05-28 DIAGNOSIS — I200 Unstable angina: Secondary | ICD-10-CM

## 2017-05-28 DIAGNOSIS — E781 Pure hyperglyceridemia: Secondary | ICD-10-CM

## 2017-05-28 DIAGNOSIS — Z01818 Encounter for other preprocedural examination: Secondary | ICD-10-CM

## 2017-05-28 MED ORDER — NITROGLYCERIN 0.4 MG SL SUBL: 0.4000 mg | SUBLINGUAL_TABLET | SUBLINGUAL | 3 refills | Status: DC | PRN

## 2017-05-28 NOTE — Patient Instructions (Addendum)
Medication Instructions:   START TAKING NITROGLYCERIN 0.4 SUBLINGUAL  FOR AS NEEDED FOR CHEST PAIN  If you need a refill on your cardiac medications before your next appointment, please call your pharmacy.  Labwork:  CBC BMET TODAY    Testing/Procedures:  SEE LETTER FOR CATHERIZATION WITH DR Excell Seltzer 05-30-17    Follow-Up: 2 WEEKS POST CATH  05-30-17    Any Other Special Instructions Will Be Listed Below (If Applicable).

## 2017-05-28 NOTE — H&P (View-Only) (Signed)
Cardiology Office Note   Date:  05/28/2017   ID:  Wayne Mitchell, DOB Jul 14, 1935, MRN [id number]  PCP:  Wayne Housekeeper, MD  Cardiologist:  Dr. Katrinka Mitchell   Chief Complaint  Patient presents with  . Pre-op Exam  . Chest Pain      History of Present Illness: Wayne Mitchell is a 82 y.o. male who presents for pre-op clearance for EGD with Dr. Matthias Mitchell though symptoms could be cardiac.     History of an episode of syncope occurred after a long day working in his yard with inadequate fluid intake and diffuse sweating. 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 His wife awakened him and took him inside the house and he fainted again. He then laid on the floor and have profuse sweating.  Upon office evaluation last year Dr. Katrinka Mitchell felt the episode likely represented dehydration or a vasovagal episode. Noted at that time was severe bradycardia on beta blocker therapy with heart rate less than 50 bpm. Beta blocker therapy was discontinued. Monitoring was done for 48 hours and did not demonstrate any excessive bradycardia or sustained tachycardia. He's had no recurrent syncopal episodes or dizziness in the interval since his faint.  Last visit 02/22/16  he had no complaints.  His BP had been controlled.  In July last year he began having some chest burning and his PCP gave him NTG which did help.      Today with plans for EGD due to increased chest burning over last year and now severe with radiation to both arms.  No associated SOB, nausea or diaphoresis.  Occurs at night and awakens from sleep, resolves after 5-10 min. He is out of NTG so he sits up and improves also occurs with exertion at times. He will pull trash can across yard and develops same burning lasts about a min.  At times he has no pain for day and other times it is severe.  He has been on pantoprazole for 2 weeks without improvement.     No prior CAD, remote nuc 2007 that was  without ischemia.  There was a fixed defect at the apex and EF was 65%.      Echo in 2017 with EF 55-60% no RWMA, mild to moderate AR     Past Medical History:  Diagnosis Date  . Carotid artery disease (HCC)   . Colon polyp    small  . Coronary artery disease   . HTN (hypertension)   . Hypothyroidism   . Syncope   . Syncope   . Thrombocytopenia (HCC)     Past Surgical History:  Procedure Laterality Date  . CATARACT EXTRACTION Left   . ESOPHAGOGASTRODUODENOSCOPY N/A 10/23/2014   Procedure: ESOPHAGOGASTRODUODENOSCOPY (EGD);  Surgeon: Wayne Redbird, MD;  Location: [facility] ENDOSCOPY;  Service: Endoscopy;  Laterality: N/A;     Current Outpatient Medications  Medication Sig Dispense Refill  . amLODipine (NORVASC) 10 MG tablet Take 10 mg by mouth daily.    Marland Kitchen atorvastatin (LIPITOR) 20 MG tablet Take 20 mg by mouth daily.    . hydrochlorothiazide (HYDRODIURIL) 12.5 MG tablet Take 1 tablet by mouth daily.    . irbesartan (AVAPRO) 300 MG tablet Take 300 mg by mouth daily.    Marland Kitchen levothyroxine (SYNTHROID, LEVOTHROID) 112 MCG tablet Take 112 mcg by mouth daily before breakfast.    . pantoprazole (PROTONIX) 20 MG tablet Take 20 mg by mouth 2 (two) times daily.  No current facility-administered medications for this visit.     Allergies:   Lisinopril    Social History:  The patient  reports that he has never smoked. He has never used smokeless tobacco. He reports that he drinks about 2.4 oz of alcohol per week. He reports that he does not use drugs.   Family History:  The patient's family history includes Healthy in his brother; Pancreatic cancer in his mother.    ROS:  General:no colds or fevers, no weight changes Skin:no rashes or ulcers HEENT:no blurred vision, no congestion CV:see HPI PUL:see HPI GI:no diarrhea constipation or melena, no indigestion GU:no hematuria, no dysuria MS:no joint pain, no claudication Neuro:no further episodes of syncope, no lightheadedness Endo:no  diabetes, no thyroid disease  Wt Readings from Last 3 Encounters:  05/28/17 164 lb (74.4 kg)  02/22/16 171 lb (77.6 kg)  10/20/15 171 lb 3.2 oz (77.7 kg)     PHYSICAL EXAM: VS:  BP (!) 144/62   Pulse 95   Ht 5\' 7"  (1.702 m)   Wt 164 lb (74.4 kg)   SpO2 95%   BMI 25.69 kg/m  , BMI Body mass index is 25.69 kg/m. General:Pleasant affect, NAD Skin:Warm and dry, brisk capillary refill HEENT:normocephalic, sclera clear, mucus membranes moist Neck:supple, no JVD, no bruits  Heart:S1S2 RRR without murmur, gallup, rub or click Lungs:clear without rales, rhonchi, or wheezes XBJ:YNWG, non tender, + BS, do not palpate liver spleen or masses Ext:no lower ext edema, 1+ pedal pulses, 2+ radial pulses Neuro:alert and oriented X 3 MAE, follows commands, + facial symmetry    EKG:  EKG is ordered today. The ekg ordered today demonstrates RBBB and no acute changes.    Recent Labs: No results found for requested labs within last 8760 hours.    Lipid Panel No results found for: CHOL, TRIG, HDL, CHOLHDL, VLDL, LDLCALC, LDLDIRECT     Other studies Reviewed: Additional studies/ records that were reviewed today include:   Echo 2017  Study Conclusions  - Left ventricle: The cavity size was normal. Wall thickness was   normal. Systolic function was normal. The estimated ejection   fraction was in the range of 55% to 60%. Wall motion was normal;   there were no regional wall motion abnormalities. Left   ventricular diastolic function parameters were normal. - Aortic valve: There was mild to moderate regurgitation. Valve   area (VTI): 3.07 cm^2. Valve area (Vmax): 3.32 cm^2. - Technically adequate study.  ASSESSMENT AND PLAN:  1.  Unstable angina, though could be GI.  With pain occurring with exertion after discussion with Dr. Eden Mitchell will proceed with cardiac cath. + HTN, HLD and age.  Will check labs today.  He will hold HCTZ and avapro day of procedure.  Discussed procedure in detail  and he is willing to proceed.  He will resume ASA 81 mg daily, and NTG sl PRN.  If did not add BB with hx of bradycardia on BB in past.  Cath will be in 2 days.  The patient understands that risks included but are not limited to stroke (1 in 1000), death (1 in 1000), kidney failure [usually temporary] (1 in 500), bleeding (1 in 200), allergic reaction [possibly serious] (1 in 200).    2.  HTN stable.   On amlodipine, avapro, HCTZ  3.   HLD on statin and last lipid was 77 followed by PCP.    Current medicines are reviewed with the patient today.  The patient Has no concerns  regarding medicines.  The following changes have been made:  See above Labs/ tests ordered today include:see above  Disposition:   FU:  see above  Signed, Nada Boozer, NP  05/28/2017 12:27 PM    [facility] [address] [place], [place], [place]  [address]/ [address] [place], [place] Phone: [phone]; Fax: [phone]  [phone]

## 2017-05-28 NOTE — Addendum Note (Signed)
Addended by: Tonita Phoenix on: 05/28/2017 12:53 PM   Modules accepted: Orders

## 2017-05-28 NOTE — Progress Notes (Signed)
Cardiology Office Note   Date:  05/28/2017   ID:  Wayne Mitchell, DOB Jul 14, 1935, MRN [id number]  PCP:  Georgann Housekeeper, MD  Cardiologist:  Dr. Katrinka Blazing   Chief Complaint  Patient presents with  . Pre-op Exam  . Chest Pain      History of Present Illness: Wayne Mitchell is a 82 y.o. male who presents for pre-op clearance for EGD with Dr. Matthias Hughs though symptoms could be cardiac.     History of an episode of syncope occurred after a long day working in his yard with inadequate fluid intake and diffuse sweating. 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 His wife awakened him and took him inside the house and he fainted again. He then laid on the floor and have profuse sweating.  Upon office evaluation last year Dr. Katrinka Blazing felt the episode likely represented dehydration or a vasovagal episode. Noted at that time was severe bradycardia on beta blocker therapy with heart rate less than 50 bpm. Beta blocker therapy was discontinued. Monitoring was done for 48 hours and did not demonstrate any excessive bradycardia or sustained tachycardia. He's had no recurrent syncopal episodes or dizziness in the interval since his faint.  Last visit 02/22/16  he had no complaints.  His BP had been controlled.  In July last year he began having some chest burning and his PCP gave him NTG which did help.      Today with plans for EGD due to increased chest burning over last year and now severe with radiation to both arms.  No associated SOB, nausea or diaphoresis.  Occurs at night and awakens from sleep, resolves after 5-10 min. He is out of NTG so he sits up and improves also occurs with exertion at times. He will pull trash can across yard and develops same burning lasts about a min.  At times he has no pain for day and other times it is severe.  He has been on pantoprazole for 2 weeks without improvement.     No prior CAD, remote nuc 2007 that was  without ischemia.  There was a fixed defect at the apex and EF was 65%.      Echo in 2017 with EF 55-60% no RWMA, mild to moderate AR     Past Medical History:  Diagnosis Date  . Carotid artery disease (HCC)   . Colon polyp    small  . Coronary artery disease   . HTN (hypertension)   . Hypothyroidism   . Syncope   . Syncope   . Thrombocytopenia (HCC)     Past Surgical History:  Procedure Laterality Date  . CATARACT EXTRACTION Left   . ESOPHAGOGASTRODUODENOSCOPY N/A 10/23/2014   Procedure: ESOPHAGOGASTRODUODENOSCOPY (EGD);  Surgeon: Bernette Redbird, MD;  Location: [facility] ENDOSCOPY;  Service: Endoscopy;  Laterality: N/A;     Current Outpatient Medications  Medication Sig Dispense Refill  . amLODipine (NORVASC) 10 MG tablet Take 10 mg by mouth daily.    Marland Kitchen atorvastatin (LIPITOR) 20 MG tablet Take 20 mg by mouth daily.    . hydrochlorothiazide (HYDRODIURIL) 12.5 MG tablet Take 1 tablet by mouth daily.    . irbesartan (AVAPRO) 300 MG tablet Take 300 mg by mouth daily.    Marland Kitchen levothyroxine (SYNTHROID, LEVOTHROID) 112 MCG tablet Take 112 mcg by mouth daily before breakfast.    . pantoprazole (PROTONIX) 20 MG tablet Take 20 mg by mouth 2 (two) times daily.  No current facility-administered medications for this visit.     Allergies:   Lisinopril    Social History:  The patient  reports that he has never smoked. He has never used smokeless tobacco. He reports that he drinks about 2.4 oz of alcohol per week. He reports that he does not use drugs.   Family History:  The patient's family history includes Healthy in his brother; Pancreatic cancer in his mother.    ROS:  General:no colds or fevers, no weight changes Skin:no rashes or ulcers HEENT:no blurred vision, no congestion CV:see HPI PUL:see HPI GI:no diarrhea constipation or melena, no indigestion GU:no hematuria, no dysuria MS:no joint pain, no claudication Neuro:no further episodes of syncope, no lightheadedness Endo:no  diabetes, no thyroid disease  Wt Readings from Last 3 Encounters:  05/28/17 164 lb (74.4 kg)  02/22/16 171 lb (77.6 kg)  10/20/15 171 lb 3.2 oz (77.7 kg)     PHYSICAL EXAM: VS:  BP (!) 144/62   Pulse 95   Ht 5\' 7"  (1.702 m)   Wt 164 lb (74.4 kg)   SpO2 95%   BMI 25.69 kg/m  , BMI Body mass index is 25.69 kg/m. General:Pleasant affect, NAD Skin:Warm and dry, brisk capillary refill HEENT:normocephalic, sclera clear, mucus membranes moist Neck:supple, no JVD, no bruits  Heart:S1S2 RRR without murmur, gallup, rub or click Lungs:clear without rales, rhonchi, or wheezes XBJ:YNWG, non tender, + BS, do not palpate liver spleen or masses Ext:no lower ext edema, 1+ pedal pulses, 2+ radial pulses Neuro:alert and oriented X 3 MAE, follows commands, + facial symmetry    EKG:  EKG is ordered today. The ekg ordered today demonstrates RBBB and no acute changes.    Recent Labs: No results found for requested labs within last 8760 hours.    Lipid Panel No results found for: CHOL, TRIG, HDL, CHOLHDL, VLDL, LDLCALC, LDLDIRECT     Other studies Reviewed: Additional studies/ records that were reviewed today include:   Echo 2017  Study Conclusions  - Left ventricle: The cavity size was normal. Wall thickness was   normal. Systolic function was normal. The estimated ejection   fraction was in the range of 55% to 60%. Wall motion was normal;   there were no regional wall motion abnormalities. Left   ventricular diastolic function parameters were normal. - Aortic valve: There was mild to moderate regurgitation. Valve   area (VTI): 3.07 cm^2. Valve area (Vmax): 3.32 cm^2. - Technically adequate study.  ASSESSMENT AND PLAN:  1.  Unstable angina, though could be GI.  With pain occurring with exertion after discussion with Dr. Eden Emms will proceed with cardiac cath. + HTN, HLD and age.  Will check labs today.  He will hold HCTZ and avapro day of procedure.  Discussed procedure in detail  and he is willing to proceed.  He will resume ASA 81 mg daily, and NTG sl PRN.  If did not add BB with hx of bradycardia on BB in past.  Cath will be in 2 days.  The patient understands that risks included but are not limited to stroke (1 in 1000), death (1 in 1000), kidney failure [usually temporary] (1 in 500), bleeding (1 in 200), allergic reaction [possibly serious] (1 in 200).    2.  HTN stable.   On amlodipine, avapro, HCTZ  3.   HLD on statin and last lipid was 77 followed by PCP.    Current medicines are reviewed with the patient today.  The patient Has no concerns  regarding medicines.  The following changes have been made:  See above Labs/ tests ordered today include:see above  Disposition:   FU:  see above  Signed, Nada Boozer, NP  05/28/2017 12:27 PM    [facility] [address] [place], [place], [place]  [address]/ [address] [place], [place] Phone: [phone]; Fax: [phone]  [phone]

## 2017-05-29 ENCOUNTER — Telehealth: Payer: Self-pay | Admitting: *Deleted

## 2017-05-29 NOTE — Telephone Encounter (Signed)
Pt contacted pre-catheterization scheduled at [facility] for: April 23,2019 12 noon Verified arrival time and place: [facility] Main Entrance A/North Tower at: 10 AM Nothing to eat or drink after midnight prior to cath. Verified allergies in Epic. Verified no diabetes medications.    Hold: HCTZ AM of cath Irbesartan AM of cath  Except hold medications AM meds can be  taken pre-cath with sip of water including: ASA   Confirmed patient has responsible person to drive home post procedure and observe patient for 24 hours: yes

## 2017-05-30 ENCOUNTER — Inpatient Hospital Stay (HOSPITAL_COMMUNITY)
Admission: AD | Disposition: A | Discharge status: Home Health | Payer: Self-pay | Source: Ambulatory Visit | Attending: Cardiothoracic Surgery

## 2017-05-30 ENCOUNTER — Inpatient Hospital Stay (HOSPITAL_COMMUNITY)
Admission: AD | Admit: 2017-05-30 | Discharge: 2017-06-09 | DRG: 234 | Disposition: A | Discharge status: Home Health | Payer: Medicare HMO | Source: Ambulatory Visit | Attending: Cardiothoracic Surgery | Admitting: Cardiothoracic Surgery

## 2017-05-30 ENCOUNTER — Encounter (HOSPITAL_COMMUNITY): Payer: Self-pay | Admitting: Thoracic Surgery (Cardiothoracic Vascular Surgery)

## 2017-05-30 DIAGNOSIS — E860 Dehydration: Secondary | ICD-10-CM | POA: Diagnosis present

## 2017-05-30 DIAGNOSIS — R079 Chest pain, unspecified: Secondary | ICD-10-CM | POA: Diagnosis not present

## 2017-05-30 DIAGNOSIS — Z4682 Encounter for fitting and adjustment of non-vascular catheter: Secondary | ICD-10-CM | POA: Diagnosis not present

## 2017-05-30 DIAGNOSIS — J9811 Atelectasis: Secondary | ICD-10-CM | POA: Diagnosis not present

## 2017-05-30 DIAGNOSIS — E785 Hyperlipidemia, unspecified: Secondary | ICD-10-CM | POA: Diagnosis not present

## 2017-05-30 DIAGNOSIS — I25709 Atherosclerosis of coronary artery bypass graft(s), unspecified, with unspecified angina pectoris: Secondary | ICD-10-CM | POA: Diagnosis present

## 2017-05-30 DIAGNOSIS — N179 Acute kidney failure, unspecified: Secondary | ICD-10-CM | POA: Diagnosis not present

## 2017-05-30 DIAGNOSIS — E876 Hypokalemia: Secondary | ICD-10-CM | POA: Diagnosis not present

## 2017-05-30 DIAGNOSIS — D696 Thrombocytopenia, unspecified: Secondary | ICD-10-CM | POA: Diagnosis present

## 2017-05-30 DIAGNOSIS — I25110 Atherosclerotic heart disease of native coronary artery with unstable angina pectoris: Secondary | ICD-10-CM | POA: Diagnosis not present

## 2017-05-30 DIAGNOSIS — I083 Combined rheumatic disorders of mitral, aortic and tricuspid valves: Secondary | ICD-10-CM | POA: Diagnosis not present

## 2017-05-30 DIAGNOSIS — D62 Acute posthemorrhagic anemia: Secondary | ICD-10-CM | POA: Diagnosis not present

## 2017-05-30 DIAGNOSIS — E8770 Fluid overload, unspecified: Secondary | ICD-10-CM | POA: Diagnosis not present

## 2017-05-30 DIAGNOSIS — I2510 Atherosclerotic heart disease of native coronary artery without angina pectoris: Secondary | ICD-10-CM | POA: Diagnosis present

## 2017-05-30 DIAGNOSIS — I200 Unstable angina: Secondary | ICD-10-CM

## 2017-05-30 DIAGNOSIS — I10 Essential (primary) hypertension: Secondary | ICD-10-CM | POA: Diagnosis not present

## 2017-05-30 DIAGNOSIS — K219 Gastro-esophageal reflux disease without esophagitis: Secondary | ICD-10-CM | POA: Diagnosis present

## 2017-05-30 DIAGNOSIS — R0602 Shortness of breath: Secondary | ICD-10-CM | POA: Diagnosis not present

## 2017-05-30 DIAGNOSIS — J449 Chronic obstructive pulmonary disease, unspecified: Secondary | ICD-10-CM | POA: Diagnosis present

## 2017-05-30 DIAGNOSIS — J939 Pneumothorax, unspecified: Secondary | ICD-10-CM | POA: Diagnosis not present

## 2017-05-30 DIAGNOSIS — Z951 Presence of aortocoronary bypass graft: Secondary | ICD-10-CM | POA: Diagnosis not present

## 2017-05-30 DIAGNOSIS — I428 Other cardiomyopathies: Secondary | ICD-10-CM | POA: Diagnosis not present

## 2017-05-30 DIAGNOSIS — Z01810 Encounter for preprocedural cardiovascular examination: Secondary | ICD-10-CM | POA: Diagnosis not present

## 2017-05-30 DIAGNOSIS — E039 Hypothyroidism, unspecified: Secondary | ICD-10-CM | POA: Diagnosis present

## 2017-05-30 DIAGNOSIS — R0989 Other specified symptoms and signs involving the circulatory and respiratory systems: Secondary | ICD-10-CM | POA: Diagnosis not present

## 2017-05-30 DIAGNOSIS — I340 Nonrheumatic mitral (valve) insufficiency: Secondary | ICD-10-CM | POA: Diagnosis not present

## 2017-05-30 DIAGNOSIS — I351 Nonrheumatic aortic (valve) insufficiency: Secondary | ICD-10-CM | POA: Diagnosis not present

## 2017-05-30 DIAGNOSIS — Z79899 Other long term (current) drug therapy: Secondary | ICD-10-CM

## 2017-05-30 HISTORY — DX: Atherosclerotic heart disease of native coronary artery with unstable angina pectoris: I25.110

## 2017-05-30 HISTORY — PX: LEFT HEART CATH AND CORONARY ANGIOGRAPHY: CATH118249

## 2017-05-30 LAB — CBC
HEMATOCRIT: 42.1 % (ref 37.5–51.0)
HEMOGLOBIN: 13.8 g/dL (ref 13.0–17.7)
MCH: 29.4 pg (ref 26.6–33.0)
MCHC: 32.8 g/dL (ref 31.5–35.7)
MCV: 90 fL (ref 79–97)
Platelets: 126 10*3/uL — ABNORMAL LOW (ref 150–379)
RBC: 4.7 x10E6/uL (ref 4.14–5.80)
RDW: 14.2 % (ref 12.3–15.4)
WBC: 8.2 10*3/uL (ref 3.4–10.8)

## 2017-05-30 LAB — BASIC METABOLIC PANEL WITH GFR
BUN/Creatinine Ratio: 12 (ref 10–24)
BUN: 14 mg/dL (ref 8–27)
CO2: 23 mmol/L (ref 20–29)
Calcium: 9.6 mg/dL (ref 8.6–10.2)
Chloride: 100 mmol/L (ref 96–106)
Creatinine, Ser: 1.21 mg/dL (ref 0.76–1.27)
GFR calc Af Amer: 65 mL/min/1.73
GFR calc non Af Amer: 57 mL/min/1.73 — ABNORMAL LOW
Glucose: 89 mg/dL (ref 65–99)
Potassium: 3.9 mmol/L (ref 3.5–5.2)
Sodium: 139 mmol/L (ref 134–144)

## 2017-05-30 LAB — HEPARIN LEVEL (UNFRACTIONATED): Heparin Unfractionated: 0.17 IU/mL — ABNORMAL LOW (ref 0.30–0.70)

## 2017-05-30 SURGERY — LEFT HEART CATH AND CORONARY ANGIOGRAPHY
Anesthesia: LOCAL

## 2017-05-30 MED ORDER — MIDAZOLAM HCL 2 MG/2ML IJ SOLN
INTRAMUSCULAR | Status: AC
  Filled 2017-05-30: qty 2

## 2017-05-30 MED ORDER — SODIUM CHLORIDE 0.9 % WEIGHT BASED INFUSION
3.0000 mL/kg/h | INTRAVENOUS | Status: DC
  Administered 2017-05-30: 3 mL/kg/h via INTRAVENOUS

## 2017-05-30 MED ORDER — HEPARIN SODIUM (PORCINE) 1000 UNIT/ML IJ SOLN
INTRAMUSCULAR | Status: DC | PRN
  Administered 2017-05-30: 4000 [IU] via INTRAVENOUS

## 2017-05-30 MED ORDER — SODIUM CHLORIDE 0.9 % WEIGHT BASED INFUSION: 1.0000 mL/kg/h | INTRAVENOUS | Status: DC

## 2017-05-30 MED ORDER — IODIXANOL 320 MG/ML IV SOLN
INTRAVENOUS | Status: DC | PRN
  Administered 2017-05-30: 130 mL via INTRAVENOUS

## 2017-05-30 MED ORDER — FENTANYL CITRATE (PF) 100 MCG/2ML IJ SOLN
INTRAMUSCULAR | Status: AC
  Filled 2017-05-30: qty 2

## 2017-05-30 MED ORDER — LEVOTHYROXINE SODIUM 112 MCG PO TABS
112.0000 ug | ORAL_TABLET | Freq: Every day | ORAL | Status: DC
  Administered 2017-05-31 – 2017-06-09 (×10): 112 ug via ORAL
  Filled 2017-05-30 (×10): qty 1

## 2017-05-30 MED ORDER — HEPARIN (PORCINE) IN NACL 100-0.45 UNIT/ML-% IJ SOLN
950.0000 [IU]/h | INTRAMUSCULAR | Status: DC
  Administered 2017-05-30: 900 [IU]/h via INTRAVENOUS
  Administered 2017-06-01 (×2): 950 [IU]/h via INTRAVENOUS
  Filled 2017-05-30 (×3): qty 250

## 2017-05-30 MED ORDER — SODIUM CHLORIDE 0.9% FLUSH: 3.0000 mL | Freq: Two times a day (BID) | INTRAVENOUS | Status: DC

## 2017-05-30 MED ORDER — SODIUM CHLORIDE 0.9 % IV SOLN: 250.0000 mL | INTRAVENOUS | Status: DC | PRN

## 2017-05-30 MED ORDER — LIDOCAINE HCL (PF) 1 % IJ SOLN
INTRAMUSCULAR | Status: AC
  Filled 2017-05-30: qty 30

## 2017-05-30 MED ORDER — ATORVASTATIN CALCIUM 20 MG PO TABS
20.0000 mg | ORAL_TABLET | Freq: Every day | ORAL | Status: DC
  Administered 2017-05-31 – 2017-06-09 (×9): 20 mg via ORAL
  Filled 2017-05-30 (×9): qty 1

## 2017-05-30 MED ORDER — AMLODIPINE BESYLATE 10 MG PO TABS
10.0000 mg | ORAL_TABLET | Freq: Every day | ORAL | Status: DC
  Administered 2017-05-30 – 2017-06-01 (×3): 10 mg via ORAL
  Filled 2017-05-30 (×3): qty 1

## 2017-05-30 MED ORDER — LIDOCAINE HCL (PF) 1 % IJ SOLN
INTRAMUSCULAR | Status: DC | PRN
  Administered 2017-05-30: 2 mL

## 2017-05-30 MED ORDER — OXYCODONE HCL 5 MG PO TABS: 5.0000 mg | ORAL_TABLET | ORAL | Status: DC | PRN

## 2017-05-30 MED ORDER — HEPARIN (PORCINE) IN NACL 1000-0.9 UT/500ML-% IV SOLN
INTRAVENOUS | Status: AC
  Filled 2017-05-30: qty 1000

## 2017-05-30 MED ORDER — HEPARIN (PORCINE) IN NACL 100-0.45 UNIT/ML-% IJ SOLN
900.0000 [IU]/h | INTRAMUSCULAR | Status: DC
  Filled 2017-05-30: qty 250

## 2017-05-30 MED ORDER — SODIUM CHLORIDE 0.9% FLUSH: 3.0000 mL | INTRAVENOUS | Status: DC | PRN

## 2017-05-30 MED ORDER — SODIUM CHLORIDE 0.9 % WEIGHT BASED INFUSION: 1.0000 mL/kg/h | INTRAVENOUS | Status: AC

## 2017-05-30 MED ORDER — FENTANYL CITRATE (PF) 100 MCG/2ML IJ SOLN
INTRAMUSCULAR | Status: DC | PRN
  Administered 2017-05-30: 25 ug via INTRAVENOUS

## 2017-05-30 MED ORDER — ASPIRIN 81 MG PO CHEW: 81.0000 mg | CHEWABLE_TABLET | ORAL | Status: DC

## 2017-05-30 MED ORDER — ONDANSETRON HCL 4 MG/2ML IJ SOLN
4.0000 mg | Freq: Four times a day (QID) | INTRAMUSCULAR | Status: DC | PRN
  Filled 2017-05-30: qty 2

## 2017-05-30 MED ORDER — HYDROCHLOROTHIAZIDE 25 MG PO TABS
12.5000 mg | ORAL_TABLET | Freq: Every day | ORAL | Status: DC
  Administered 2017-05-30 – 2017-06-01 (×3): 12.5 mg via ORAL
  Filled 2017-05-30 (×3): qty 1

## 2017-05-30 MED ORDER — NITROGLYCERIN 0.4 MG SL SUBL: 0.4000 mg | SUBLINGUAL_TABLET | SUBLINGUAL | Status: DC | PRN

## 2017-05-30 MED ORDER — VERAPAMIL HCL 2.5 MG/ML IV SOLN
INTRAVENOUS | Status: AC
  Filled 2017-05-30: qty 2

## 2017-05-30 MED ORDER — ASPIRIN EC 81 MG PO TBEC
81.0000 mg | DELAYED_RELEASE_TABLET | Freq: Every day | ORAL | Status: DC
  Administered 2017-05-31 – 2017-06-01 (×2): 81 mg via ORAL
  Filled 2017-05-30 (×2): qty 1

## 2017-05-30 MED ORDER — PANTOPRAZOLE SODIUM 20 MG PO TBEC
20.0000 mg | DELAYED_RELEASE_TABLET | Freq: Two times a day (BID) | ORAL | Status: DC
  Administered 2017-05-30 – 2017-06-01 (×5): 20 mg via ORAL
  Filled 2017-05-30 (×5): qty 1

## 2017-05-30 MED ORDER — IRBESARTAN 300 MG PO TABS
300.0000 mg | ORAL_TABLET | Freq: Every day | ORAL | Status: DC
  Administered 2017-05-30 – 2017-06-01 (×3): 300 mg via ORAL
  Filled 2017-05-30 (×3): qty 1

## 2017-05-30 MED ORDER — HEPARIN (PORCINE) IN NACL 2-0.9 UNITS/ML
INTRAMUSCULAR | Status: AC | PRN
  Administered 2017-05-30 (×2): 500 mL

## 2017-05-30 MED ORDER — MIDAZOLAM HCL 2 MG/2ML IJ SOLN
INTRAMUSCULAR | Status: DC | PRN
  Administered 2017-05-30: 1 mg via INTRAVENOUS

## 2017-05-30 MED ORDER — ACETAMINOPHEN 325 MG PO TABS: 650.0000 mg | ORAL_TABLET | ORAL | Status: DC | PRN

## 2017-05-30 MED ORDER — SODIUM CHLORIDE 0.9% FLUSH
3.0000 mL | Freq: Two times a day (BID) | INTRAVENOUS | Status: DC
  Administered 2017-05-31 – 2017-06-01 (×3): 3 mL via INTRAVENOUS

## 2017-05-30 MED ORDER — VERAPAMIL HCL 2.5 MG/ML IV SOLN
INTRAVENOUS | Status: DC | PRN
  Administered 2017-05-30: 13:00:00 via INTRA_ARTERIAL

## 2017-05-30 SURGICAL SUPPLY — 19 items
CATH INFINITI 5 FR 3DRC (CATHETERS) ×1 IMPLANT
CATH INFINITI 5 FR AR1 MOD (CATHETERS) ×1 IMPLANT
CATH INFINITI 5 FR JL3.5 (CATHETERS) ×1 IMPLANT
CATH INFINITI 5 FR JR5 (CATHETERS) ×1 IMPLANT
CATH INFINITI 5FR AL1 (CATHETERS) ×1 IMPLANT
CATH INFINITI 5FR ANG PIGTAIL (CATHETERS) ×1 IMPLANT
CATH INFINITI JR4 5F (CATHETERS) ×1 IMPLANT
CATH LAUNCHER 6FR AL.75 (CATHETERS) ×1 IMPLANT
DEVICE RAD COMP TR BAND LRG (VASCULAR PRODUCTS) ×1 IMPLANT
GUIDEWIRE INQWIRE 1.5J.035X260 (WIRE) IMPLANT
INQWIRE 1.5J .035X260CM (WIRE) ×2
KIT HEART LEFT (KITS) ×2 IMPLANT
NDL PERC 21GX4CM (NEEDLE) IMPLANT
NEEDLE PERC 21GX4CM (NEEDLE) ×2 IMPLANT
PACK CARDIAC CATHETERIZATION (CUSTOM PROCEDURE TRAY) ×2 IMPLANT
SHEATH RAIN RADIAL 21G 6FR (SHEATH) ×1 IMPLANT
SYR MEDRAD MARK V 150ML (SYRINGE) ×2 IMPLANT
TRANSDUCER W/STOPCOCK (MISCELLANEOUS) ×2 IMPLANT
TUBING CIL FLEX 10 FLL-RA (TUBING) ×2 IMPLANT

## 2017-05-30 NOTE — Consult Note (Signed)
[address]       [name] [address]             [phone]          CARDIOTHORACIC SURGERY CONSULTATION REPORT  PCP is Georgann Housekeeper, MD Referring Provider is [name], MD Primary Cardiologist is Lyn Records, MD  Reason for consultation:  Severe 3-vessel CAD w/ unstable angina pectoris  HPI:  Patient is a 82 year old male with history of hypertension, syncope, thrombocytopenia, and hypothyroidism who has been referred for surgical consultation to discuss treatment options for management of severe multivessel coronary artery disease with unstable angina.  Patient's cardiac history dates back to 2007 when he suffered a syncopal episode.  He apparently had a workup that was felt to be negative.  In 2015 he had a second syncopal episode and was evaluated at [facility].  His third syncopal episode occurred in 2017.  He was evaluated by Dr. Katrinka Blazing at that time.  Echocardiogram revealed normal left ventricular function with mild to moderate aortic insufficiency.  At the time the patient was noted to have severe bradycardia with heart rate less than 50 bpm on beta-blocker therapy.  His beta-blocker was discontinued.  He later underwent a Holter monitor off beta-blocker therapy which revealed sinus rhythm with occasional episodes of junctional tachycardia.  There were occasional brief 3-second pauses during sleep but no evidence of heart block and no severe bradycardia.  In July 2018 the patient began to experience occasional episodes of burning substernal chest pain.  He discussed this with his primary care physician who prescribed sublingual nitroglycerin.  Nitroglycerin always relieved symptoms.  Over the past 3 months the patient has had rapid acceleration of episodes of burning substernal chest pain.  Symptoms typically occur at night and frequently waking the patient from his sleep.  They usually resolve within 5-10 minutes are always relieved by  nitroglycerin.  He ran out of nitroglycerin tablets and was seen in the office May 28, 2017 by Nada Boozer who renewed the patient's prescription for nitroglycerin and scheduled the patient for elective diagnostic cardiac catheterization.  Catheterization performed today by Dr. Excell Seltzer revealed severe multivessel coronary artery disease with critical ostial stenosis of the left anterior descending coronary artery.  Left ventricular function was mildly reduced with hypokinesis of the distal anterior wall and apex.  Ejection fraction was estimated 50-55%.  Coronary anatomy was felt to be unfavorable for percutaneous coronary intervention and cardiothoracic surgical consultation was requested.  Patient is married and lives locally in [place].  He has been relatively healthy all of his life with exception of history as documented.  He admits that he is not very active physically but overall he reports no significant physical limitations.  He does not have arthritis and he ambulates without any need for mechanical assistance.  He drives an automobile and tends to ordinary chores around the house.  He enjoys playing golf.  He reports accelerating symptoms of burning substernal chest pain beginning more than 6 months ago and accelerating considerably over the past 2-3 months.  Symptoms occur most commonly at night when he is in bed trying to sleep and have awoken him from sleep at night.  He occasionally has similar episodes of pain with activity.  With particularly severe episodes of pain the intensity gets worse and he has burning sensation radiating down both arms.  Symptoms are occasionally associated with shortness of breath.  He denies any history of prolonged  episodes of chest pain and he reports that all symptoms are promptly relieved by administration of nitroglycerin.  He denies any problems with exertional shortness of breath, PND, orthopnea, or lower extremity edema.  He has not had any recent dizzy  spells or syncope.  Past Medical History:  Diagnosis Date  . Carotid artery disease (HCC)   . Colon polyp    small  . Coronary artery disease   . HTN (hypertension)   . Hypothyroidism   . Syncope   . Syncope   . Thrombocytopenia (HCC)     Past Surgical History:  Procedure Laterality Date  . CATARACT EXTRACTION Left   . ESOPHAGOGASTRODUODENOSCOPY N/A 10/23/2014   Procedure: ESOPHAGOGASTRODUODENOSCOPY (EGD);  Surgeon: Bernette Redbird, MD;  Location: [facility] ENDOSCOPY;  Service: Endoscopy;  Laterality: N/A;    Family History  Problem Relation Age of Onset  . Pancreatic cancer Mother   . Healthy Brother     Social History   Socioeconomic History  . Marital status: Married    Spouse name: Not on file  . Number of children: Not on file  . Years of education: Not on file  . Highest education level: Not on file  Occupational History  . Not on file  Social Needs  . Financial resource strain: Not on file  . Food insecurity:    Worry: Not on file    Inability: Not on file  . Transportation needs:    Medical: Not on file    Non-medical: Not on file  Tobacco Use  . Smoking status: Never Smoker  . Smokeless tobacco: Never Used  Substance and Sexual Activity  . Alcohol use: Yes    Alcohol/week: 2.4 oz    Types: 4 Cans of beer per week  . Drug use: No  . Sexual activity: Not on file  Lifestyle  . Physical activity:    Days per week: Not on file    Minutes per session: Not on file  . Stress: Not on file  Relationships  . Social connections:    Talks on phone: Not on file    Gets together: Not on file    Attends religious service: Not on file    Active member of club or organization: Not on file    Attends meetings of clubs or organizations: Not on file    Relationship status: Not on file  . Intimate partner violence:    Fear of current or ex partner: Not on file    Emotionally abused: Not on file    Physically abused: Not on file    Forced sexual activity: Not on file    Other Topics Concern  . Not on file  Social History Narrative  . Not on file    Prior to Admission medications   Medication Sig Start Date End Date Taking? Authorizing Provider  amLODipine (NORVASC) 10 MG tablet Take 10 mg by mouth at bedtime.    Yes [provider]  aspirin EC 81 MG tablet Take 81 mg by mouth See admin instructions. Twice monthly   Yes [provider]  atorvastatin (LIPITOR) 20 MG tablet Take 20 mg by mouth daily.   Yes [provider]  hydrochlorothiazide (HYDRODIURIL) 12.5 MG tablet Take 12.5 mg by mouth daily.  05/05/17  Yes [provider]  irbesartan (AVAPRO) 300 MG tablet Take 300 mg by mouth daily.   Yes [provider]  levothyroxine (SYNTHROID, LEVOTHROID) 112 MCG tablet Take 112 mcg by mouth daily before breakfast.  Yes [provider]  nitroGLYCERIN (NITROSTAT) 0.4 MG SL tablet Place 1 tablet (0.4 mg total) under the tongue every 5 (five) minutes as needed for chest pain. 05/28/17 08/26/17 Yes Leone Brand, NP  pantoprazole (PROTONIX) 20 MG tablet Take 20 mg by mouth 2 (two) times daily.   Yes [provider]    Current Facility-Administered Medications  Medication Dose Route Frequency Provider Last Rate Last Dose  . 0.9 %  sodium chloride infusion  250 mL Intravenous PRN [name], MD      . 0.9% sodium chloride infusion  1 mL/kg/hr Intravenous Continuous [name], MD 78.9 mL/hr at 05/30/17 1349 1 mL/kg/hr at 05/30/17 1349  . acetaminophen (TYLENOL) tablet 650 mg  650 mg Oral Q4H PRN [name], MD      . amLODipine (NORVASC) tablet 10 mg  10 mg Oral Mickel Crow, MD      . Melene Muller ON 05/31/2017] aspirin EC tablet 81 mg  81 mg Oral Daily [name], MD      . Melene Muller ON 05/31/2017] atorvastatin (LIPITOR) tablet 20 mg  20 mg Oral Daily [name], MD      . heparin ADULT infusion 100 units/mL (25000 units/286mL sodium chloride 0.45%)  900 Units/hr Intravenous  Continuous Mancheril, Candis Schatz, RPH      . hydrochlorothiazide (HYDRODIURIL) tablet 12.5 mg  12.5 mg Oral Daily [name], MD      . irbesartan Evlyn Kanner) tablet 300 mg  300 mg Oral Daily [name], MD      . Melene Muller ON 05/31/2017] levothyroxine (SYNTHROID, LEVOTHROID) tablet 112 mcg  112 mcg Oral QAC breakfast [name], MD      . nitroGLYCERIN (NITROSTAT) SL tablet 0.4 mg  0.4 mg Sublingual Q5 min PRN [name], MD      . ondansetron [facility]) injection 4 mg  4 mg Intravenous Q6H PRN [name], MD      . oxyCODONE (Oxy IR/ROXICODONE) immediate release tablet 5-10 mg  5-10 mg Oral Q4H PRN [name], MD      . pantoprazole (PROTONIX) EC tablet 20 mg  20 mg Oral BID [name], MD      . sodium chloride flush (NS) 0.9 % injection 3 mL  3 mL Intravenous Freada Bergeron, MD      . sodium chloride flush (NS) 0.9 % injection 3 mL  3 mL Intravenous PRN [name], MD        Allergies  Allergen Reactions  . Lisinopril Cough    Cough       Review of Systems:   General:  normal appetite, normal energy, no weight gain, no weight loss, no fever  Cardiac:  + chest pain with exertion, + chest pain at rest, no SOB with exertion, no resting SOB, no PND, no orthopnea, no palpitations, no arrhythmia, no atrial fibrillation, no LE edema, no dizzy spells, + syncope  Respiratory:  no shortness of breath, no home oxygen, no productive cough, no dry cough, no bronchitis, no wheezing, no hemoptysis, no asthma, no pain with inspiration or cough, no sleep apnea, no CPAP at night  GI:   no difficulty swallowing, no reflux, no frequent heartburn, no hiatal hernia, no abdominal pain, no constipation, no diarrhea, no hematochezia, no hematemesis, no melena  GU:   no dysuria,  no frequency, no urinary tract infection, no hematuria, no enlarged prostate, no kidney stones, no kidney disease  Vascular:  no pain suggestive of claudication, no pain in feet, no  leg cramps, no  varicose veins, no DVT, no non-healing foot ulcer  Neuro:   no stroke, no TIA's, no seizures, no headaches, no temporary blindness one eye,  no slurred speech, no peripheral neuropathy, no chronic pain, no instability of gait, no memory/cognitive dysfunction  Musculoskeletal: no arthritis , no joint swelling, no myalgias, no difficulty walking, normal mobility   Skin:   no rash, no itching, no skin infections, no pressure sores or ulcerations  Psych:   no anxiety, no depression, no nervousness, no unusual recent stress  Eyes:   no blurry vision, no floaters, no recent vision changes, does not wear glasses or contacts  ENT:   no hearing loss, no loose or painful teeth, no dentures, last saw dentist within the past year  Hematologic:  no easy bruising, no abnormal bleeding, no clotting disorder, no frequent epistaxis  Endocrine:  no diabetes, does not check CBG's at home     Physical Exam:   BP (!) 163/92   Pulse 76   Temp 97.8 F (36.6 C) (Oral)   Resp 16   Ht 5\' 7"  (1.702 m)   Wt 158 lb 6 oz (71.8 kg)   SpO2 98%   BMI 24.81 kg/m   General:    well-appearing  HEENT:  Unremarkable   Neck:   no JVD, no bruits, no adenopathy   Chest:   clear to auscultation, symmetrical breath sounds, no wheezes, no rhonchi   CV:   RRR, no  murmur   Abdomen:  soft, non-tender, no masses   Extremities:  warm, well-perfused, pulses diminished but palpable, no lower extremity edema  Rectal/GU  Deferred  Neuro:   Grossly non-focal and symmetrical throughout  Skin:   Clean and dry, no rashes, no breakdown  Diagnostic Tests:  Lab Results: Recent Labs    05/28/17 1253  WBC 8.2  HGB 13.8  HCT 42.1  PLT 126*   BMET:  Recent Labs    05/28/17 1253  NA 139  K 3.9  CL 100  CO2 23  GLUCOSE 89  BUN 14  CREATININE 1.21  CALCIUM 9.6    CBG (last 3)  No results for input(s): GLUCAP in the last 72 hours. PT/INR:  No results for input(s): LABPROT, INR in the last 72 hours.  CXR:  N/A   LEFT  HEART CATH AND CORONARY ANGIOGRAPHY  Conclusion   1. Critical stenosis at the ostium of the LAD, large vessel, appears to be a suitable target for grafting 2. Severe stenosis of the first OM branch of the circumflex 3. Patent RCA, unable to selectively engage, suspected codominant vessel 4. Mild segmental LV contraction abnormality with LVEF estimated at 50-55%  Plan: IV heparin, TCTS consultation for consideration of CABG  Indications   Coronary artery disease involving native coronary artery of native heart with unstable angina pectoris (HCC) [I25.110 (ICD-10-CM)]  Procedural Details/Technique   Technical Details INDICATION: Chest pain concerning for unstable angina pectoris. Unclear if GI or cardiac, but high suspicion for CAD  PROCEDURAL DETAILS: The right wrist was prepped, draped, and anesthetized with 1% lidocaine. Using the modified Seldinger technique, a 5/6 French Slender sheath was introduced into the right radial artery. 3 mg of verapamil was administered through the sheath, weight-based unfractionated heparin was administered intravenously. Standard Judkins catheters were used for selective coronary angiography, aortic root angiography, and left ventriculography. Catheter exchanges were performed over an exchange length guidewire. There were no immediate procedural complications. A TR band was used for radial hemostasis  at the completion of the procedure. The patient was transferred to the post catheterization recovery area for further monitoring.    Estimated blood loss <50 mL.  During this procedure the patient was administered the following to achieve and maintain moderate conscious sedation: Versed 1 mg, Fentanyl 25 mcg, while the patient's heart rate, blood pressure, and oxygen saturation were continuously monitored. The period of conscious sedation was 46 minutes, of which I was present face-to-face 100% of this time.  Coronary Findings   Diagnostic  Dominance: Left    Left Anterior Descending  Ost LAD to Prox LAD lesion 99% stenosed  Ost LAD to Prox LAD lesion is 99% stenosed. The lesion is calcified. Critical stenosis at the LAD origin, extending back to the left main with no landing zone for stenting  Left Circumflex  Prox Cx lesion 50% stenosed  Prox Cx lesion is 50% stenosed.  First Obtuse Marginal Branch  Ost 1st Mrg lesion 90% stenosed  Ost 1st Mrg lesion is 90% stenosed. severe stenosis at the origin of a large first OM branch  Right Coronary Artery  The vessel is moderately calcified. The RCA is patent, but could not be selectively injected. Multiple diagnostic and guide catheters are attempted. Suspect nondominant or codominant RCA. No severe stenosis identified.  Intervention   No interventions have been documented.  Wall Motion              Left Heart   Left Ventricle There is mild left ventricular systolic dysfunction. LV end diastolic pressure is mildly elevated. The left ventricular ejection fraction is 50-55% by visual estimate. There are LV function abnormalities due to segmental dysfunction. Mild hypokinesis of the distal anterior wall and apex, LVEF estimated at 50-55%  Coronary Diagrams   Diagnostic Diagram       Implants    No implant documentation for this case.  MERGE Images   Show images for CARDIAC CATHETERIZATION   Link to Procedure Log   Procedure Log    Hemo Data    Most Recent Value  AO Systolic Pressure 142 mmHg  AO Diastolic Pressure 55 mmHg  AO Mean 93 mmHg  LV Systolic Pressure 149 mmHg  LV Diastolic Pressure 14 mmHg  LV EDP 28 mmHg  Arterial Occlusion Pressure Extended Systolic Pressure 144 mmHg  Arterial Occlusion Pressure Extended Diastolic Pressure 61 mmHg  Arterial Occlusion Pressure Extended Mean Pressure 96 mmHg  Left Ventricular Apex Extended Systolic Pressure 148 mmHg  Left Ventricular Apex Extended Diastolic Pressure 11 mmHg  Left Ventricular Apex Extended EDP Pressure 25 mmHg       Impression:  Patient has severe three-vessel coronary artery disease with mild left ventricular systolic dysfunction including critical ostial stenosis of the left anterior descending coronary artery with anatomy unfavorable for percutaneous coronary intervention.  He presents with symptoms of burning substernal chest pain consistent with unstable angina pectoris.  His last episode of chest discomfort occurred yesterday evening and was promptly relieved with 1 sublingual nitroglycerin.  He has not had any chest discomfort since his diagnostic cardiac catheterization.  I have personally reviewed the patient's diagnostic cardiac catheterization and agree the patient would best be treated with surgical revascularization as soon as practical.   Plan:  I have reviewed the indications, risks, and potential benefits of coronary artery bypass grafting with the patient in his hospital room this evening.  Alternative treatment strategies have been discussed, including the relative risks, benefits and long term prognosis associated with medical therapy, percutaneous coronary intervention, and  surgical revascularization.  Expectations for his postoperative convalescence following uncomplicated coronary artery bypass grafting have been discussed.  The patient understands and accepts all potential associated risks of surgery including but not limited to risk of death, stroke or other neurologic complication, myocardial infarction, congestive heart failure, respiratory failure, renal failure, bleeding requiring blood transfusion and/or reexploration, aortic dissection or other major vascular complication, arrhythmia, heart block or bradycardia requiring permanent pacemaker, pneumonia, pleural effusion, wound infection, pulmonary embolus or other thromboembolic complication, chronic pain or other delayed complications related to median sternotomy, or the late recurrence of symptomatic ischemic heart disease and/or  congestive heart failure.  The importance of long term risk modification have been emphasized.  All questions answered.  We plan to proceed with coronary artery bypass grafting as soon as practical.  We will follow closely and discuss timing depending upon operating room availability.   I spent in excess of 120 minutes during the conduct of this hospital consultation and >50% of this time involved direct face-to-face encounter for counseling and/or coordination of the patient's care.   Salvatore Decent. Cornelius Moras, MD 05/30/2017 6:24 PM

## 2017-05-30 NOTE — Interval H&P Note (Signed)
History and Physical Interval Note:  05/30/2017 12:49 PM  Wayne Mitchell  has presented today for surgery, with the diagnosis of ua  The various methods of treatment have been discussed with the patient and family. After consideration of risks, benefits and other options for treatment, the patient has consented to  Procedure(s): LEFT HEART CATH AND CORONARY ANGIOGRAPHY (N/A) as a surgical intervention .  The patient's history has been reviewed, patient examined, no change in status, stable for surgery.  I have reviewed the patient's chart and labs.  Questions were answered to the patient's satisfaction.     [name]

## 2017-05-30 NOTE — Research (Signed)
CADFEM Informed Consent   Subject Name: Wayne Mitchell  Subject met inclusion and exclusion criteria.  The informed consent form, study requirements and expectations were reviewed with the subject and questions and concerns were addressed prior to the signing of the consent form.  The subject verbalized understanding of the trail requirements.  The subject agreed to participate in the CADFEM trial and signed the informed consent.  The informed consent was obtained prior to performance of any protocol-specific procedures for the subject.  A copy of the signed informed consent was given to the subject and a copy was placed in the subject's medical record.   B  05/30/2017, 10:32 AM  

## 2017-05-30 NOTE — Progress Notes (Signed)
ANTICOAGULATION CONSULT NOTE - Initial Consult  Pharmacy Consult for Heparin Indication: 3v disease for CABG  Allergies  Allergen Reactions  . Lisinopril Cough    Cough     Patient Measurements: Height: 5\' 7"  (170.2 cm) Weight: 174 lb (78.9 kg) IBW/kg (Calculated) : 66.1 Heparin Dosing Weight: 78.9 kg  Vital Signs: Temp: 97.8 F (36.6 C) (04/19 1011) Temp Source: Oral (04/19 1011) BP: 146/75 (04/19 1400) Pulse Rate: 75 (04/19 1400)  Labs: Recent Labs    05/28/17 1253  HGB 13.8  HCT 42.1  PLT 126*  CREATININE 1.21    Estimated Creatinine Clearance: 46.3 mL/min (by C-G formula based on SCr of 1.21 mg/dL).   Medical History: Past Medical History:  Diagnosis Date  . Carotid artery disease (HCC)   . Colon polyp    small  . Coronary artery disease   . HTN (hypertension)   . Hypothyroidism   . Syncope   . Syncope   . Thrombocytopenia (HCC)     Medications:  Medications Prior to Admission  Medication Sig Dispense Refill Last Dose  . amLODipine (NORVASC) 10 MG tablet Take 10 mg by mouth at bedtime.    05/29/2017  . aspirin EC 81 MG tablet Take 81 mg by mouth See admin instructions. Twice monthly   05/30/2017 at 0700  . atorvastatin (LIPITOR) 20 MG tablet Take 20 mg by mouth daily.   05/30/2017 at 0700  . hydrochlorothiazide (HYDRODIURIL) 12.5 MG tablet Take 12.5 mg by mouth daily.    05/29/2017 at Unknown time  . irbesartan (AVAPRO) 300 MG tablet Take 300 mg by mouth daily.   05/29/2017 at Unknown time  . levothyroxine (SYNTHROID, LEVOTHROID) 112 MCG tablet Take 112 mcg by mouth daily before breakfast.   05/30/2017 at 0700  . nitroGLYCERIN (NITROSTAT) 0.4 MG SL tablet Place 1 tablet (0.4 mg total) under the tongue every 5 (five) minutes as needed for chest pain. 25 tablet 3 05/29/2017 at Unknown time  . pantoprazole (PROTONIX) 20 MG tablet Take 20 mg by mouth 2 (two) times daily.   05/29/2017 at Unknown time    Assessment: 38 YOM s/p LHC to resume IV heparin 4 hours  after sheath pull. Pt found to have multivessel disease. TCTS consulted for CABG evaluation. H/H wnl, Plt 126k. Sheath pulled at 1336.  Goal of Therapy:  Heparin level 0.3-0.7 units/ml Monitor platelets by anticoagulation protocol: Yes   Plan:  -Start IV heparin at 900 units/hr at 1730 today  -F/u 6 hr HL -Monitor daily HL, CBC and s/s of bleeding   Vinnie Level, PharmD., BCPS Clinical Pharmacist Clinical phone for 05/30/17 until 3:30pm: [phone] If after 3:30pm, please call main pharmacy at: [phone]

## 2017-05-31 ENCOUNTER — Other Ambulatory Visit: Payer: Self-pay

## 2017-05-31 ENCOUNTER — Inpatient Hospital Stay (HOSPITAL_COMMUNITY): Payer: Medicare HMO

## 2017-05-31 DIAGNOSIS — Z01810 Encounter for preprocedural cardiovascular examination: Secondary | ICD-10-CM

## 2017-05-31 DIAGNOSIS — I200 Unstable angina: Secondary | ICD-10-CM

## 2017-05-31 LAB — COMPREHENSIVE METABOLIC PANEL
ALK PHOS: 71 U/L (ref 38–126)
ALT: 14 U/L — AB (ref 17–63)
AST: 16 U/L (ref 15–41)
Albumin: 3.8 g/dL (ref 3.5–5.0)
Anion gap: 10 (ref 5–15)
BILIRUBIN TOTAL: 1 mg/dL (ref 0.3–1.2)
BUN: 11 mg/dL (ref 6–20)
CO2: 24 mmol/L (ref 22–32)
CREATININE: 1.07 mg/dL (ref 0.61–1.24)
Calcium: 9 mg/dL (ref 8.9–10.3)
Chloride: 103 mmol/L (ref 101–111)
GFR calc Af Amer: >60 mL/min (ref >60)
GLUCOSE: 99 mg/dL (ref 65–99)
Potassium: 3.5 mmol/L (ref 3.5–5.1)
Sodium: 137 mmol/L (ref 135–145)
TOTAL PROTEIN: 7 g/dL (ref 6.5–8.1)

## 2017-05-31 LAB — LIPID PANEL
CHOL/HDL RATIO: 2.3 ratio
Cholesterol: 130 mg/dL (ref 0–200)
HDL: 56 mg/dL (ref >40)
LDL CALC: 61 mg/dL (ref 0–99)
TRIGLYCERIDES: 63 mg/dL (ref <150)
VLDL: 13 mg/dL (ref 0–40)

## 2017-05-31 LAB — URINALYSIS, ROUTINE W REFLEX MICROSCOPIC
Bilirubin Urine: NEGATIVE
GLUCOSE, UA: NEGATIVE mg/dL
Hgb urine dipstick: NEGATIVE
KETONES UR: NEGATIVE mg/dL
LEUKOCYTES UA: NEGATIVE
NITRITE: NEGATIVE
Protein, ur: NEGATIVE mg/dL
Specific Gravity, Urine: 1.016 (ref 1.005–1.030)
pH: 6 (ref 5.0–8.0)

## 2017-05-31 LAB — SURGICAL PCR SCREEN
MRSA, PCR: NEGATIVE
Staphylococcus aureus: NEGATIVE

## 2017-05-31 LAB — CBC
HEMATOCRIT: 40.6 % (ref 39.0–52.0)
Hemoglobin: 14.1 g/dL (ref 13.0–17.0)
MCH: 30.7 pg (ref 26.0–34.0)
MCHC: 34.7 g/dL (ref 30.0–36.0)
MCV: 88.3 fL (ref 78.0–100.0)
Platelets: 85 10*3/uL — ABNORMAL LOW (ref 150–400)
RBC: 4.6 MIL/uL (ref 4.22–5.81)
RDW: 13.6 % (ref 11.5–15.5)
WBC: 6.6 10*3/uL (ref 4.0–10.5)

## 2017-05-31 LAB — PROTIME-INR
INR: 1.02
Prothrombin Time: 13.3 seconds (ref 11.4–15.2)

## 2017-05-31 LAB — APTT: aPTT: 77 seconds — ABNORMAL HIGH (ref 24–36)

## 2017-05-31 LAB — BLOOD GAS, ARTERIAL
ACID-BASE EXCESS: 1.2 mmol/L (ref 0.0–2.0)
Bicarbonate: 25.3 mmol/L (ref 20.0–28.0)
Drawn by: 25203
O2 Saturation: 95.8 %
PCO2 ART: 40 mmHg (ref 32.0–48.0)
PH ART: 7.418 (ref 7.350–7.450)
PO2 ART: 81.3 mmHg — AB (ref 83.0–108.0)
Patient temperature: 98.6

## 2017-05-31 LAB — HEPARIN LEVEL (UNFRACTIONATED): HEPARIN UNFRACTIONATED: 0.77 [IU]/mL — AB (ref 0.30–0.70)

## 2017-05-31 LAB — HEMOGLOBIN A1C
HEMOGLOBIN A1C: 5.6 % (ref 4.8–5.6)
Mean Plasma Glucose: 114.02 mg/dL

## 2017-05-31 LAB — PREALBUMIN: Prealbumin: 25.8 mg/dL (ref 18–38)

## 2017-05-31 LAB — ABO/RH: ABO/RH(D): B POS

## 2017-05-31 NOTE — Progress Notes (Signed)
ANTICOAGULATION CONSULT NOTE   Pharmacy Consult for Heparin Indication: 3v disease for CABG  Allergies  Allergen Reactions  . Lisinopril Cough    Cough     Patient Measurements: Height: 5\' 7"  (170.2 cm) Weight: 156 lb 9.6 oz (71 kg) IBW/kg (Calculated) : 66.1 Heparin Dosing Weight: 78.9 kg  Vital Signs: Temp: 97.5 F (36.4 C) (04/20 0600) Temp Source: Oral (04/20 0600) BP: 145/65 (04/20 0600) Pulse Rate: 63 (04/20 0600)  Labs: Recent Labs    05/28/17 1253 05/30/17 2309 05/31/17 0517 05/31/17 0816  HGB 13.8  --  14.1  --   HCT 42.1  --  40.6  --   PLT 126*  --  85*  --   APTT  --   --  77*  --   LABPROT  --   --  13.3  --   INR  --   --  1.02  --   HEPARINUNFRC  --  0.17*  --  0.77*  CREATININE 1.21  --  1.07  --     Estimated Creatinine Clearance: 52.3 mL/min (by C-G formula based on SCr of 1.07 mg/dL).   Assessment: 24 YOM s/p LHC to resume IV heparin 4 hours after sheath pull. Pt found to have multivessel disease.   Awaiting CABG 4/22, CBC stable Heparin level slightly elevated this AM  Goal of Therapy:  Heparin level 0.3-0.7 units/ml Monitor platelets by anticoagulation protocol: Yes   Plan:  -Decrease heparin to 1050 units / hr -Monitor daily HL, CBC and s/s of bleeding   Thank you Okey Regal, PharmD [phone]

## 2017-05-31 NOTE — Progress Notes (Signed)
Carotid duplex prelim: Right 1-39% ICA stenosis, >50% ECA stenosis. Left 60-79% ICA stenosis, low end of scale.  UE Doppler: bilateral palmar arch= decreases >50% with radial compression, normal with ulnar compression.  Pedal artery waveforms WNL.   Farrel Demark, RDMS, RVT

## 2017-05-31 NOTE — Progress Notes (Signed)
MD, did u want to order a pulmonary function test in prep for CABG on Monday, Thanks Lavonda Jumbo RN.

## 2017-05-31 NOTE — Progress Notes (Signed)
Progress Note  Patient Name: Wayne Mitchell Date of Encounter: 05/31/2017  Primary Cardiologist: Dr Katrinka Blazing  Subjective   No complaints.   Inpatient Medications    Scheduled Meds: . amLODipine  10 mg Oral QHS  . aspirin EC  81 mg Oral Daily  . atorvastatin  20 mg Oral Daily  . hydrochlorothiazide  12.5 mg Oral Daily  . irbesartan  300 mg Oral Daily  . levothyroxine  112 mcg Oral QAC breakfast  . pantoprazole  20 mg Oral BID  . sodium chloride flush  3 mL Intravenous Q12H   Continuous Infusions: . sodium chloride    . heparin 1,050 Units/hr (05/31/17 1113)   PRN Meds: sodium chloride, acetaminophen, nitroGLYCERIN, ondansetron (ZOFRAN) IV, oxyCODONE, sodium chloride flush   Vital Signs    Vitals:   05/31/17 0112 05/31/17 0600 05/31/17 0601 05/31/17 1200  BP: 131/62 (!) 145/65  128/60  Pulse: 72 63  62  Resp:  15    Temp: 97.8 F (36.6 C) (!) 97.5 F (36.4 C)  97.8 F (36.6 C)  TempSrc: Oral Oral  Oral  SpO2: 97% 95%  98%  Weight:   156 lb 9.6 oz (71 kg)   Height:        Intake/Output Summary (Last 24 hours) at 05/31/2017 1320 Last data filed at 05/31/2017 1000 Gross per 24 hour  Intake 788.71 ml  Output 1400 ml  Net -611.29 ml   Filed Weights   05/30/17 1011 05/30/17 1642 05/31/17 0601  Weight: 174 lb (78.9 kg) 158 lb 6 oz (71.8 kg) 156 lb 9.6 oz (71 kg)    Telemetry    SR, PACs, sinus brady- Personally Reviewed  ECG    na  Physical Exam   GEN: No acute distress.   Neck: No JVD Cardiac: RRR, no murmurs, rubs, or gallops.  Respiratory: Clear to auscultation bilaterally. GI: Soft, nontender, non-distended  MS: No edema; No deformity. Neuro:  Nonfocal  Psych: Normal affect   Labs    Chemistry Recent Labs  Lab 05/28/17 1253 05/31/17 0517  NA 139 137  K 3.9 3.5  CL 100 103  CO2 23 24  GLUCOSE 89 99  BUN 14 11  CREATININE 1.21 1.07  CALCIUM 9.6 9.0  PROT  --  7.0  ALBUMIN  --  3.8  AST  --  16  ALT  --  14*  ALKPHOS  --  71    BILITOT  --  1.0  GFRNONAA 57* >60  GFRAA 65 >60  ANIONGAP  --  10     Hematology Recent Labs  Lab 05/28/17 1253 05/31/17 0517  WBC 8.2 6.6  RBC 4.70 4.60  HGB 13.8 14.1  HCT 42.1 40.6  MCV 90 88.3  MCH 29.4 30.7  MCHC 32.8 34.7  RDW 14.2 13.6  PLT 126* 85*    Cardiac EnzymesNo results for input(s): TROPONINI in the last 168 hours. No results for input(s): TROPIPOC in the last 168 hours.   BNPNo results for input(s): BNP, PROBNP in the last 168 hours.   DDimer No results for input(s): DDIMER in the last 168 hours.   Radiology    Dg Chest 2 View  Result Date: 05/31/2017 CLINICAL DATA:  82 year old male with chest pain. EXAM: CHEST - 2 VIEW COMPARISON:  Prior chest x-ray 08/05/2015 FINDINGS: Cardiac and mediastinal contours remain within normal limits. Atherosclerotic calcifications are present in the transverse aorta. Prominent nipple shadows noted bilaterally. These are symmetric. No additional nodular opacities identified.  The lungs are clear save for mild chronic bronchitic changes. No pneumothorax or pleural effusion. No acute osseous abnormality. Bilateral acromioclavicular joint osteoarthritis. IMPRESSION: 1. No acute cardiopulmonary process. 2.  Aortic Atherosclerosis (ICD10-170.0) Electronically Signed   By: Malachy Moan M.D.   On: 05/31/2017 09:41    Cardiac Studies    Patient Profile     82 y.o. male admitted with chest pain.   Assessment & Plan    1. CAD - cath this admit with critical ostial LAD, severe stenosis of OM1 - seen by CT surgery, plan for CABG Monday - medical therapy with ASA, atorva 20, hep gtt, irbesartan. Would not start beta blocker newly this close to surgery, also some occasional sinus brady primarily early AM hours.  - echo pending   For questions or updates, please contact [facility] Please consult [email or web address] for contact info under Cardiology/STEMI.      Joanie Coddington, MD  05/31/2017, 1:20 PM

## 2017-05-31 NOTE — Progress Notes (Signed)
1 Day Post-Op Procedure(s) (LRB): LEFT HEART CATH AND CORONARY ANGIOGRAPHY (N/A) Subjective: Patient examined and coronary arteriograms personally reviewed and discussed with patient and wife Multivessel CABG has been recommended by the patient's cardiologist Patient denies any chest pain overnight Agree with recommendation for surgical coronary revascularization his best long-term therapy for his critical multivessel coronary artery disease. I have discussed the procedure in detail with the patient and wife and they understand the expected the benefits as well as the potential risk.  Objective: Vital signs in last 24 hours: Temp:  [97.5 F (36.4 C)-98.7 F (37.1 C)] 97.5 F (36.4 C) (04/20 0600) Pulse Rate:  [0-92] 63 (04/20 0600) Cardiac Rhythm: Normal sinus rhythm (04/20 0731) Resp:  [0-28] 15 (04/20 0600) BP: (129-163)/(62-92) 145/65 (04/20 0600) SpO2:  [0 %-100 %] 95 % (04/20 0600) Weight:  [156 lb 9.6 oz (71 kg)-174 lb (78.9 kg)] 156 lb 9.6 oz (71 kg) (04/20 0601)  Hemodynamic parameters for last 24 hours:    Intake/Output from previous day: 04/19 0701 - 04/20 0700 In: 548.7 [P.O.:120; I.V.:428.7] Out: 1400 [Urine:1400] Intake/Output this shift: No intake/output data recorded.       Exam    General- alert and comfortable    Neck- no JVD, no cervical adenopathy palpable, no carotid bruit   Lungs- clear without rales, wheezes   Cor- regular rate and rhythm, no murmur , gallop   Abdomen- soft, non-tender   Extremities - warm, non-tender, minimal edema   Neuro- oriented, appropriate, no focal weakness   Lab Results: Recent Labs    05/28/17 1253 05/31/17 0517  WBC 8.2 6.6  HGB 13.8 14.1  HCT 42.1 40.6  PLT 126* 85*   BMET:  Recent Labs    05/28/17 1253 05/31/17 0517  NA 139 137  K 3.9 3.5  CL 100 103  CO2 23 24  GLUCOSE 89 99  BUN 14 11  CREATININE 1.21 1.07  CALCIUM 9.6 9.0    PT/INR:  Recent Labs    05/31/17 0517  LABPROT 13.3  INR 1.02    ABG    Component Value Date/Time   PHART 7.418 05/31/2017 0541   HCO3 25.3 05/31/2017 0541   O2SAT 95.8 05/31/2017 0541   CBG (last 3)  No results for input(s): GLUCAP in the last 72 hours.  Assessment/Plan: S/P Procedure(s) (LRB): LEFT HEART CATH AND CORONARY ANGIOGRAPHY (N/A) Plan CABG Monday, April 22   LOS: 1 day    Kathlee Nations Trigt III 05/31/2017

## 2017-05-31 NOTE — Progress Notes (Signed)
Patient resting comfortably during shift report. Requests mesh underwear until her daughter comes with her clothing.

## 2017-05-31 NOTE — Progress Notes (Signed)
ANTICOAGULATION CONSULT NOTE   Pharmacy Consult for Heparin Indication: 3v disease for CABG  Allergies  Allergen Reactions  . Lisinopril Cough    Cough     Patient Measurements: Height: 5\' 7"  (170.2 cm) Weight: 158 lb 6 oz (71.8 kg) IBW/kg (Calculated) : 66.1 Heparin Dosing Weight: 78.9 kg  Vital Signs: Temp: 97.8 F (36.6 C) (04/19 2056) Temp Source: Oral (04/19 2056) BP: 143/71 (04/19 2056) Pulse Rate: 75 (04/19 2056)  Labs: Recent Labs    05/28/17 1253 05/30/17 2309  HGB 13.8  --   HCT 42.1  --   PLT 126*  --   HEPARINUNFRC  --  0.17*  CREATININE 1.21  --     Estimated Creatinine Clearance: 46.3 mL/min (by C-G formula based on SCr of 1.21 mg/dL).   Medical History: Past Medical History:  Diagnosis Date  . Carotid artery disease (HCC)   . Colon polyp    small  . Coronary artery disease   . Coronary artery disease involving native coronary artery of native heart with unstable angina pectoris (HCC) 05/30/2017  . HTN (hypertension)   . Hypothyroidism   . Syncope   . Syncope   . Thrombocytopenia (HCC)     Medications:  Medications Prior to Admission  Medication Sig Dispense Refill Last Dose  . amLODipine (NORVASC) 10 MG tablet Take 10 mg by mouth at bedtime.    05/29/2017  . aspirin EC 81 MG tablet Take 81 mg by mouth See admin instructions. Twice monthly   05/30/2017 at 0700  . atorvastatin (LIPITOR) 20 MG tablet Take 20 mg by mouth daily.   05/30/2017 at 0700  . hydrochlorothiazide (HYDRODIURIL) 12.5 MG tablet Take 12.5 mg by mouth daily.    05/29/2017 at Unknown time  . irbesartan (AVAPRO) 300 MG tablet Take 300 mg by mouth daily.   05/29/2017 at Unknown time  . levothyroxine (SYNTHROID, LEVOTHROID) 112 MCG tablet Take 112 mcg by mouth daily before breakfast.   05/30/2017 at 0700  . nitroGLYCERIN (NITROSTAT) 0.4 MG SL tablet Place 1 tablet (0.4 mg total) under the tongue every 5 (five) minutes as needed for chest pain. 25 tablet 3 05/29/2017 at Unknown time   . pantoprazole (PROTONIX) 20 MG tablet Take 20 mg by mouth 2 (two) times daily.   05/29/2017 at Unknown time    Assessment: 65 YOM s/p LHC to resume IV heparin 4 hours after sheath pull. Pt found to have multivessel disease. TCTS consulted for CABG evaluation. H/H wnl, Plt 126k. Sheath pulled at 1336. Initial heparin level 0.17 units/ml  Goal of Therapy:  Heparin level 0.3-0.7 units/ml Monitor platelets by anticoagulation protocol: Yes   Plan:  -Increase heparin drip to 1150 units/hr -F/u 8 hr HL -Monitor daily HL, CBC and s/s of bleeding   Talbert Cage, PharmD Clinical Pharmacist

## 2017-06-01 ENCOUNTER — Inpatient Hospital Stay (HOSPITAL_COMMUNITY): Payer: Medicare HMO

## 2017-06-01 ENCOUNTER — Other Ambulatory Visit (HOSPITAL_COMMUNITY): Payer: Medicare HMO

## 2017-06-01 DIAGNOSIS — I340 Nonrheumatic mitral (valve) insufficiency: Secondary | ICD-10-CM

## 2017-06-01 LAB — BASIC METABOLIC PANEL
Anion gap: 12 (ref 5–15)
BUN: 16 mg/dL (ref 6–20)
CHLORIDE: 103 mmol/L (ref 101–111)
CO2: 22 mmol/L (ref 22–32)
Calcium: 9.2 mg/dL (ref 8.9–10.3)
Creatinine, Ser: 1.41 mg/dL — ABNORMAL HIGH (ref 0.61–1.24)
GFR calc non Af Amer: 46 mL/min — ABNORMAL LOW (ref >60)
GFR, EST AFRICAN AMERICAN: 53 mL/min — AB (ref >60)
Glucose, Bld: 104 mg/dL — ABNORMAL HIGH (ref 65–99)
Potassium: 3.6 mmol/L (ref 3.5–5.1)
Sodium: 137 mmol/L (ref 135–145)

## 2017-06-01 LAB — VON WILLEBRAND PANEL
Coagulation Factor VIII: 193 % — ABNORMAL HIGH (ref 57–163)
Ristocetin Co-factor, Plasma: 203 % — ABNORMAL HIGH (ref 50–200)
Von Willebrand Antigen, Plasma: 210 % — ABNORMAL HIGH (ref 50–200)

## 2017-06-01 LAB — CBC
HCT: 44 % (ref 39.0–52.0)
Hemoglobin: 14.9 g/dL (ref 13.0–17.0)
MCH: 30 pg (ref 26.0–34.0)
MCHC: 33.9 g/dL (ref 30.0–36.0)
MCV: 88.7 fL (ref 78.0–100.0)
PLATELETS: 86 10*3/uL — AB (ref 150–400)
RBC: 4.96 MIL/uL (ref 4.22–5.81)
RDW: 13.4 % (ref 11.5–15.5)
WBC: 7 10*3/uL (ref 4.0–10.5)

## 2017-06-01 LAB — ECHOCARDIOGRAM COMPLETE
Height: 67 in
Weight: 2499.2 oz

## 2017-06-01 LAB — COAG STUDIES INTERP REPORT

## 2017-06-01 LAB — PREPARE RBC (CROSSMATCH)

## 2017-06-01 LAB — HEPARIN LEVEL (UNFRACTIONATED): Heparin Unfractionated: 0.72 IU/mL — ABNORMAL HIGH (ref 0.30–0.70)

## 2017-06-01 MED ORDER — TRANEXAMIC ACID (OHS) PUMP PRIME SOLUTION
2.0000 mg/kg | INTRAVENOUS | Status: DC
  Filled 2017-06-01: qty 1.42

## 2017-06-01 MED ORDER — CHLORHEXIDINE GLUCONATE 4 % EX LIQD
60.0000 mL | Freq: Once | CUTANEOUS | Status: AC
  Filled 2017-06-01: qty 60

## 2017-06-01 MED ORDER — POTASSIUM CHLORIDE 2 MEQ/ML IV SOLN
80.0000 meq | INTRAVENOUS | Status: DC
  Filled 2017-06-01: qty 40

## 2017-06-01 MED ORDER — BISACODYL 5 MG PO TBEC
5.0000 mg | DELAYED_RELEASE_TABLET | Freq: Once | ORAL | Status: AC
  Administered 2017-06-01: 5 mg via ORAL
  Filled 2017-06-01: qty 1

## 2017-06-01 MED ORDER — SODIUM CHLORIDE 0.9 % IV SOLN
1250.0000 mg | INTRAVENOUS | Status: AC
  Administered 2017-06-02: 1250 mg via INTRAVENOUS
  Filled 2017-06-01: qty 1250

## 2017-06-01 MED ORDER — CHLORHEXIDINE GLUCONATE 0.12 % MT SOLN
15.0000 mL | Freq: Once | OROMUCOSAL | Status: AC
  Administered 2017-06-02: 15 mL via OROMUCOSAL
  Filled 2017-06-01: qty 15

## 2017-06-01 MED ORDER — METOPROLOL TARTRATE 12.5 MG HALF TABLET
12.5000 mg | ORAL_TABLET | Freq: Once | ORAL | Status: AC
  Administered 2017-06-02: 12.5 mg via ORAL

## 2017-06-01 MED ORDER — METOPROLOL TARTRATE 12.5 MG HALF TABLET
12.5000 mg | ORAL_TABLET | Freq: Once | ORAL | Status: AC
  Filled 2017-06-01: qty 1

## 2017-06-01 MED ORDER — CHLORHEXIDINE GLUCONATE 4 % EX LIQD
60.0000 mL | Freq: Once | CUTANEOUS | Status: AC
  Administered 2017-06-02: 4 via TOPICAL
  Filled 2017-06-01: qty 60

## 2017-06-01 MED ORDER — HEPARIN SODIUM (PORCINE) 1000 UNIT/ML IJ SOLN
INTRAMUSCULAR | Status: DC
  Filled 2017-06-01: qty 30

## 2017-06-01 MED ORDER — PERFLUTREN LIPID MICROSPHERE
1.0000 mL | INTRAVENOUS | Status: AC | PRN
  Administered 2017-06-01: 2 mL via INTRAVENOUS
  Filled 2017-06-01: qty 10

## 2017-06-01 MED ORDER — ALPRAZOLAM 0.25 MG PO TABS
0.2500 mg | ORAL_TABLET | ORAL | Status: DC | PRN
Start: 2017-06-01 — End: 2017-06-02

## 2017-06-01 MED ORDER — TEMAZEPAM 15 MG PO CAPS: 15.0000 mg | ORAL_CAPSULE | Freq: Once | ORAL | Status: DC | PRN

## 2017-06-01 MED ORDER — CHLORHEXIDINE GLUCONATE 4 % EX LIQD: 60.0000 mL | Freq: Once | CUTANEOUS | Status: AC

## 2017-06-01 MED ORDER — CHLORHEXIDINE GLUCONATE 0.12 % MT SOLN: 15.0000 mL | Freq: Once | OROMUCOSAL | Status: AC

## 2017-06-01 MED ORDER — TEMAZEPAM 15 MG PO CAPS
15.0000 mg | ORAL_CAPSULE | Freq: Once | ORAL | Status: DC | PRN
  Filled 2017-06-01: qty 1

## 2017-06-01 MED ORDER — SODIUM CHLORIDE 0.9 % IV SOLN
INTRAVENOUS | Status: AC
  Administered 2017-06-02: 1.2 [IU]/h via INTRAVENOUS
  Filled 2017-06-01: qty 1

## 2017-06-01 MED ORDER — MAGNESIUM SULFATE 50 % IJ SOLN
40.0000 meq | INTRAMUSCULAR | Status: DC
  Filled 2017-06-01: qty 9.85

## 2017-06-01 MED ORDER — PLASMA-LYTE 148 IV SOLN
INTRAVENOUS | Status: DC
  Filled 2017-06-01: qty 2.5

## 2017-06-01 MED ORDER — SODIUM CHLORIDE 0.9 % IV SOLN
30.0000 ug/min | INTRAVENOUS | Status: AC
  Administered 2017-06-02: 25 ug/min via INTRAVENOUS
  Filled 2017-06-01: qty 2

## 2017-06-01 MED ORDER — DEXTROSE 5 % IV SOLN
0.0000 ug/min | INTRAVENOUS | Status: DC
  Filled 2017-06-01: qty 4

## 2017-06-01 MED ORDER — DEXMEDETOMIDINE HCL IN NACL 400 MCG/100ML IV SOLN
0.1000 ug/kg/h | INTRAVENOUS | Status: AC
  Administered 2017-06-02: 0.7 ug/kg/h via INTRAVENOUS
  Filled 2017-06-01: qty 100

## 2017-06-01 MED ORDER — SODIUM CHLORIDE 0.9 % IV SOLN
1.5000 g | INTRAVENOUS | Status: AC
  Administered 2017-06-02: 1.5 g via INTRAVENOUS
  Filled 2017-06-01: qty 1.5

## 2017-06-01 MED ORDER — PERFLUTREN LIPID MICROSPHERE
INTRAVENOUS | Status: AC
  Administered 2017-06-01: 2 mL
  Filled 2017-06-01: qty 10

## 2017-06-01 MED ORDER — SODIUM CHLORIDE 0.9 % IV SOLN
INTRAVENOUS | Status: DC
  Administered 2017-06-01: 14:00:00 via INTRAVENOUS

## 2017-06-01 MED ORDER — TRANEXAMIC ACID 1000 MG/10ML IV SOLN
1.5000 mg/kg/h | INTRAVENOUS | Status: AC
  Administered 2017-06-02: 1.5 mg/kg/h via INTRAVENOUS
  Filled 2017-06-01: qty 25

## 2017-06-01 MED ORDER — SODIUM CHLORIDE 0.9 % IV SOLN
750.0000 mg | INTRAVENOUS | Status: AC
  Administered 2017-06-02: 750 mg via INTRAVENOUS
  Filled 2017-06-01: qty 750

## 2017-06-01 MED ORDER — TRANEXAMIC ACID (OHS) BOLUS VIA INFUSION
15.0000 mg/kg | INTRAVENOUS | Status: AC
  Administered 2017-06-02: 1063.5 mg via INTRAVENOUS
  Filled 2017-06-01: qty 1064

## 2017-06-01 MED ORDER — NITROGLYCERIN IN D5W 200-5 MCG/ML-% IV SOLN
2.0000 ug/min | INTRAVENOUS | Status: AC
  Administered 2017-06-02: 5 ug/min via INTRAVENOUS
  Filled 2017-06-01: qty 250

## 2017-06-01 MED ORDER — CHLORHEXIDINE GLUCONATE 4 % EX LIQD
60.0000 mL | Freq: Once | CUTANEOUS | Status: AC
  Administered 2017-06-01: 4 via TOPICAL

## 2017-06-01 MED ORDER — DOPAMINE-DEXTROSE 3.2-5 MG/ML-% IV SOLN
0.0000 ug/kg/min | INTRAVENOUS | Status: DC
  Filled 2017-06-01: qty 250

## 2017-06-01 NOTE — Progress Notes (Addendum)
Progress Note  Patient Name: Wayne Mitchell Date of Encounter: 06/01/2017  Primary Cardiologist: Dr Katrinka Blazing  Subjective   No complaints  Inpatient Medications    Scheduled Meds: . amLODipine  10 mg Oral QHS  . aspirin EC  81 mg Oral Daily  . atorvastatin  20 mg Oral Daily  . chlorhexidine  60 mL Topical Once   And  . [START ON 06/02/2017] chlorhexidine  60 mL Topical Once  . [START ON 06/02/2017] chlorhexidine  15 mL Mouth/Throat Once  . hydrochlorothiazide  12.5 mg Oral Daily  . irbesartan  300 mg Oral Daily  . levothyroxine  112 mcg Oral QAC breakfast  . [START ON 06/02/2017] metoprolol tartrate  12.5 mg Oral Once  . pantoprazole  20 mg Oral BID  . sodium chloride flush  3 mL Intravenous Q12H   Continuous Infusions: . sodium chloride    . heparin 950 Units/hr (06/01/17 0909)   PRN Meds: sodium chloride, acetaminophen, ALPRAZolam, nitroGLYCERIN, ondansetron (ZOFRAN) IV, oxyCODONE, sodium chloride flush, temazepam   Vital Signs    Vitals:   05/31/17 1200 05/31/17 1957 06/01/17 0558 06/01/17 0917  BP: 128/60 (!) 145/85 129/76 (!) 135/53  Pulse: 62 82 75 68  Resp:  18    Temp: 97.8 F (36.6 C) 97.7 F (36.5 C) (!) 97.4 F (36.3 C)   TempSrc: Oral Oral Oral   SpO2: 98% 99% 96%   Weight:   156 lb 3.2 oz (70.9 kg)   Height:        Intake/Output Summary (Last 24 hours) at 06/01/2017 1014 Last data filed at 06/01/2017 0836 Gross per 24 hour  Intake 685.72 ml  Output 925 ml  Net -239.28 ml   Filed Weights   05/30/17 1642 05/31/17 0601 06/01/17 0558  Weight: 158 lb 6 oz (71.8 kg) 156 lb 9.6 oz (71 kg) 156 lb 3.2 oz (70.9 kg)    Telemetry    SR - Personally Reviewed  ECG    na  Physical Exam   GEN: No acute distress.   Neck: No JVD Cardiac: RRR, no murmurs, rubs, or gallops.  Respiratory: Clear to auscultation bilaterally. GI: Soft, nontender, non-distended  MS: No edema; No deformity. Neuro:  Nonfocal  Psych: Normal affect   Labs     Chemistry Recent Labs  Lab 05/28/17 1253 05/31/17 0517  NA 139 137  K 3.9 3.5  CL 100 103  CO2 23 24  GLUCOSE 89 99  BUN 14 11  CREATININE 1.21 1.07  CALCIUM 9.6 9.0  PROT  --  7.0  ALBUMIN  --  3.8  AST  --  16  ALT  --  14*  ALKPHOS  --  71  BILITOT  --  1.0  GFRNONAA 57* >60  GFRAA 65 >60  ANIONGAP  --  10     Hematology Recent Labs  Lab 05/28/17 1253 05/31/17 0517  WBC 8.2 6.6  RBC 4.70 4.60  HGB 13.8 14.1  HCT 42.1 40.6  MCV 90 88.3  MCH 29.4 30.7  MCHC 32.8 34.7  RDW 14.2 13.6  PLT 126* 85*    Cardiac EnzymesNo results for input(s): TROPONINI in the last 168 hours. No results for input(s): TROPIPOC in the last 168 hours.   BNPNo results for input(s): BNP, PROBNP in the last 168 hours.   DDimer No results for input(s): DDIMER in the last 168 hours.   Radiology    Dg Chest 2 View  Result Date: 05/31/2017 CLINICAL DATA:  82 year old male with chest pain. EXAM: CHEST - 2 VIEW COMPARISON:  Prior chest x-ray 08/05/2015 FINDINGS: Cardiac and mediastinal contours remain within normal limits. Atherosclerotic calcifications are present in the transverse aorta. Prominent nipple shadows noted bilaterally. These are symmetric. No additional nodular opacities identified. The lungs are clear save for mild chronic bronchitic changes. No pneumothorax or pleural effusion. No acute osseous abnormality. Bilateral acromioclavicular joint osteoarthritis. IMPRESSION: 1. No acute cardiopulmonary process. 2.  Aortic Atherosclerosis (ICD10-170.0) Electronically Signed   By: Malachy Moan M.D.   On: 05/31/2017 09:41    Cardiac Studies    Patient Profile     82 y.o. male admitted with chest pain.     Assessment & Plan    1. CAD - cath this admit with critical ostial LAD, severe stenosis of OM1 - seen by CT surgery, plan for CABG Monday - medical therapy with ASA, atorva 20, hep gtt, irbesartan. Would not start beta blocker newly this close to surgery, also some  occasional sinus brady primarily early AM hours.  - echo pending  - f/u echo results today. No additional plans for today, for surgery tomorrow.    2. Low platelets - mild downtrend, stable from yesterday. Continue to monitor at this time  3. AKI Uptrend in Cr, gentle IVFs today.    For questions or updates, please contact [facility] Please consult [email or web address] for contact info under Cardiology/STEMI.      Joanie Coddington, MD  06/01/2017, 10:14 AM

## 2017-06-01 NOTE — Progress Notes (Signed)
Consents signed for CABG, in chart.

## 2017-06-01 NOTE — Anesthesia Preprocedure Evaluation (Addendum)
Anesthesia Evaluation  Patient identified by MRN, date of birth, ID band Patient awake    Reviewed: Allergy & Precautions, NPO status , Patient's Chart, lab work & pertinent test results  History of Anesthesia Complications Negative for: history of anesthetic complications  Airway Mallampati: II  TM Distance: >3 FB Neck ROM: Full    Dental  (+) Caps, Dental Advisory Given   Pulmonary neg pulmonary ROS,    breath sounds clear to auscultation       Cardiovascular hypertension, Pt. on medications + angina + CAD and + Peripheral Vascular Disease   Rhythm:Regular Rate:Normal  06/01/17 ECHO: EF 45-50%, inferior hypokinesis, mild AI, Mild MR   Neuro/Psych Pt with probable vaso-vagal dizziness with lab draw this am    GI/Hepatic Neg liver ROS, GERD  Medicated and Controlled,  Endo/Other  Hypothyroidism   Renal/GU Renal InsufficiencyRenal disease (creat 1.41)     Musculoskeletal   Abdominal   Peds  Hematology plt 116k   Anesthesia Other Findings   Reproductive/Obstetrics                            Anesthesia Physical Anesthesia Plan  ASA: III  Anesthesia Plan: General   Post-op Pain Management:    Induction: Intravenous  PONV Risk Score and Plan: Treatment may vary due to age or medical condition  Airway Management Planned: Oral ETT  Additional Equipment: Arterial line, PA Cath and Ultrasound Guidance Line Placement  Intra-op Plan:   Post-operative Plan: Post-operative intubation/ventilation  Informed Consent: I have reviewed the patients History and Physical, chart, labs and discussed the procedure including the risks, benefits and alternatives for the proposed anesthesia with the patient or authorized representative who has indicated his/her understanding and acceptance.   Dental advisory given  Plan Discussed with: CRNA and Surgeon  Anesthesia Plan Comments:          Anesthesia Quick Evaluation

## 2017-06-01 NOTE — Progress Notes (Addendum)
ANTICOAGULATION CONSULT NOTE   Pharmacy Consult for Heparin Indication: 3v disease for CABG  Allergies  Allergen Reactions  . Lisinopril Cough    Cough     Patient Measurements: Height: 5\' 7"  (170.2 cm) Weight: 156 lb 3.2 oz (70.9 kg) IBW/kg (Calculated) : 66.1 Heparin Dosing Weight: 78.9 kg  Vital Signs: Temp: 97.4 F (36.3 C) (04/21 0558) Temp Source: Oral (04/21 0558) BP: 129/76 (04/21 0558) Pulse Rate: 75 (04/21 0558)  Labs: Recent Labs    05/30/17 2309 05/31/17 0517 05/31/17 0816 06/01/17 0727  HGB  --  14.1  --   --   HCT  --  40.6  --   --   PLT  --  85*  --   --   APTT  --  77*  --   --   LABPROT  --  13.3  --   --   INR  --  1.02  --   --   HEPARINUNFRC 0.17*  --  0.77* 0.72*  CREATININE  --  1.07  --   --     Estimated Creatinine Clearance: 52.3 mL/min (by C-G formula based on SCr of 1.07 mg/dL).   Assessment: 39 YOM s/p LHC to resume IV heparin 4 hours after sheath pull. Pt found to have multivessel disease.   Awaiting CABG 4/22, H/H wnl, plts chronically low and no bleeding reported. Heparin level remains slightly elevated at 0.72 s/p rate decrease.   Goal of Therapy:  Heparin level 0.3-0.7 units/ml Monitor platelets by anticoagulation protocol: Yes   Plan:  Decrease heparin gtt to 950 units/hr Monitor daily heparin level, CBC, s/s bleeding  Daylene Posey, PharmD Pharmacy Resident Pager #: [phone] 06/01/2017 8:34 AM

## 2017-06-01 NOTE — Progress Notes (Signed)
Patient resting comfortably during shift report. Denies complaints.  

## 2017-06-01 NOTE — Progress Notes (Signed)
  Echocardiogram 2D Echocardiogram has been performed.  Wayne Mitchell 06/01/2017, 4:18 PM

## 2017-06-02 ENCOUNTER — Encounter (HOSPITAL_COMMUNITY): Payer: Self-pay | Admitting: Critical Care Medicine

## 2017-06-02 ENCOUNTER — Inpatient Hospital Stay (HOSPITAL_COMMUNITY): Payer: Medicare HMO

## 2017-06-02 ENCOUNTER — Inpatient Hospital Stay (HOSPITAL_COMMUNITY): Payer: Medicare HMO | Admitting: Anesthesiology

## 2017-06-02 ENCOUNTER — Encounter (HOSPITAL_COMMUNITY)
Admission: AD | Disposition: A | Discharge status: Home Health | Payer: Self-pay | Source: Ambulatory Visit | Attending: Cardiothoracic Surgery

## 2017-06-02 DIAGNOSIS — I2510 Atherosclerotic heart disease of native coronary artery without angina pectoris: Secondary | ICD-10-CM | POA: Diagnosis present

## 2017-06-02 DIAGNOSIS — Z951 Presence of aortocoronary bypass graft: Secondary | ICD-10-CM

## 2017-06-02 HISTORY — PX: TEE WITHOUT CARDIOVERSION: SHX5443

## 2017-06-02 HISTORY — PX: CORONARY ARTERY BYPASS GRAFT: SHX141

## 2017-06-02 LAB — CBC
HCT: 47.3 % (ref 39.0–52.0)
HCT: 29.3 % — ABNORMAL LOW (ref 39.0–52.0)
HCT: 28.8 % — ABNORMAL LOW (ref 39.0–52.0)
Hemoglobin: 16.7 g/dL (ref 13.0–17.0)
Hemoglobin: 10 g/dL — ABNORMAL LOW (ref 13.0–17.0)
Hemoglobin: 9.7 g/dL — ABNORMAL LOW (ref 13.0–17.0)
MCH: 31.3 pg (ref 26.0–34.0)
MCH: 30.2 pg (ref 26.0–34.0)
MCH: 30 pg (ref 26.0–34.0)
MCHC: 35.3 g/dL (ref 30.0–36.0)
MCHC: 34.1 g/dL (ref 30.0–36.0)
MCHC: 33.7 g/dL (ref 30.0–36.0)
MCV: 88.6 fL (ref 78.0–100.0)
MCV: 88.5 fL (ref 78.0–100.0)
MCV: 89.2 fL (ref 78.0–100.0)
PLATELETS: 80 10*3/uL — AB (ref 150–400)
Platelets: 116 10*3/uL — ABNORMAL LOW (ref 150–400)
Platelets: 98 10*3/uL — ABNORMAL LOW (ref 150–400)
RBC: 5.34 MIL/uL (ref 4.22–5.81)
RBC: 3.31 MIL/uL — ABNORMAL LOW (ref 4.22–5.81)
RBC: 3.23 MIL/uL — ABNORMAL LOW (ref 4.22–5.81)
RDW: 13.7 % (ref 11.5–15.5)
RDW: 13.3 % (ref 11.5–15.5)
RDW: 13.7 % (ref 11.5–15.5)
WBC: 8 10*3/uL (ref 4.0–10.5)
WBC: 3.5 10*3/uL — ABNORMAL LOW (ref 4.0–10.5)
WBC: 10.2 10*3/uL (ref 4.0–10.5)

## 2017-06-02 LAB — POCT I-STAT 3, ART BLOOD GAS (G3+)
ACID-BASE DEFICIT: 7 mmol/L — AB (ref 0.0–2.0)
ACID-BASE DEFICIT: 4 mmol/L — AB (ref 0.0–2.0)
Acid-base deficit: 4 mmol/L — ABNORMAL HIGH (ref 0.0–2.0)
BICARBONATE: 18.7 mmol/L — AB (ref 20.0–28.0)
BICARBONATE: 21.1 mmol/L (ref 20.0–28.0)
Bicarbonate: 20.1 mmol/L (ref 20.0–28.0)
O2 SAT: 98 %
O2 SAT: 96 %
O2 SAT: 95 %
PH ART: 7.417 (ref 7.350–7.450)
PH ART: 7.317 — AB (ref 7.350–7.450)
PH ART: 7.336 — AB (ref 7.350–7.450)
PO2 ART: 79 mmHg — AB (ref 83.0–108.0)
Patient temperature: 36.9
TCO2: 21 mmol/L — AB (ref 22–32)
TCO2: 20 mmol/L — AB (ref 22–32)
TCO2: 22 mmol/L (ref 22–32)
pCO2 arterial: 30.8 mmHg — ABNORMAL LOW (ref 32.0–48.0)
pCO2 arterial: 36.5 mmHg (ref 32.0–48.0)
pCO2 arterial: 39.5 mmHg (ref 32.0–48.0)
pO2, Arterial: 94 mmHg (ref 83.0–108.0)
pO2, Arterial: 85 mmHg (ref 83.0–108.0)

## 2017-06-02 LAB — GLUCOSE, CAPILLARY
GLUCOSE-CAPILLARY: 112 mg/dL — AB (ref 65–99)
Glucose-Capillary: 98 mg/dL (ref 65–99)
Glucose-Capillary: 98 mg/dL (ref 65–99)
Glucose-Capillary: 59 mg/dL — ABNORMAL LOW (ref 65–99)
Glucose-Capillary: 95 mg/dL (ref 65–99)
Glucose-Capillary: 108 mg/dL — ABNORMAL HIGH (ref 65–99)

## 2017-06-02 LAB — POCT I-STAT, CHEM 8
BUN: 14 mg/dL (ref 6–20)
CALCIUM ION: 1.1 mmol/L — AB (ref 1.15–1.40)
CREATININE: 1.1 mg/dL (ref 0.61–1.24)
Chloride: 106 mmol/L (ref 101–111)
GLUCOSE: 137 mg/dL — AB (ref 65–99)
HCT: 28 % — ABNORMAL LOW (ref 39.0–52.0)
Hemoglobin: 9.5 g/dL — ABNORMAL LOW (ref 13.0–17.0)
Potassium: 3.7 mmol/L (ref 3.5–5.1)
Sodium: 142 mmol/L (ref 135–145)
TCO2: 22 mmol/L (ref 22–32)

## 2017-06-02 LAB — POCT I-STAT 4, (NA,K, GLUC, HGB,HCT)
Glucose, Bld: 106 mg/dL — ABNORMAL HIGH (ref 65–99)
HEMATOCRIT: 25 % — AB (ref 39.0–52.0)
Hemoglobin: 8.5 g/dL — ABNORMAL LOW (ref 13.0–17.0)
POTASSIUM: 3.6 mmol/L (ref 3.5–5.1)
SODIUM: 142 mmol/L (ref 135–145)

## 2017-06-02 LAB — CREATININE, SERUM
Creatinine, Ser: 1.29 mg/dL — ABNORMAL HIGH (ref 0.61–1.24)
GFR calc Af Amer: 59 mL/min — ABNORMAL LOW (ref >60)
GFR calc non Af Amer: 51 mL/min — ABNORMAL LOW (ref >60)

## 2017-06-02 LAB — PLATELET COUNT: Platelets: 67 10*3/uL — ABNORMAL LOW (ref 150–400)

## 2017-06-02 LAB — MAGNESIUM: Magnesium: 2.6 mg/dL — ABNORMAL HIGH (ref 1.7–2.4)

## 2017-06-02 LAB — PROTIME-INR
INR: 1.42
PROTHROMBIN TIME: 17.2 s — AB (ref 11.4–15.2)

## 2017-06-02 LAB — APTT: aPTT: 32 seconds (ref 24–36)

## 2017-06-02 LAB — HEMOGLOBIN AND HEMATOCRIT, BLOOD
HCT: 30.2 % — ABNORMAL LOW (ref 39.0–52.0)
Hemoglobin: 10.5 g/dL — ABNORMAL LOW (ref 13.0–17.0)

## 2017-06-02 SURGERY — CORONARY ARTERY BYPASS GRAFTING (CABG)
Anesthesia: General | Site: Chest

## 2017-06-02 MED ORDER — CHLORHEXIDINE GLUCONATE CLOTH 2 % EX PADS
6.0000 | MEDICATED_PAD | Freq: Every day | CUTANEOUS | Status: DC
  Administered 2017-06-02 – 2017-06-05 (×4): 6 via TOPICAL

## 2017-06-02 MED ORDER — ACETAMINOPHEN 160 MG/5ML PO SOLN
650.0000 mg | Freq: Once | ORAL | Status: AC
  Administered 2017-06-02: 650 mg

## 2017-06-02 MED ORDER — NITROGLYCERIN IN D5W 200-5 MCG/ML-% IV SOLN: 0.0000 ug/min | INTRAVENOUS | Status: DC

## 2017-06-02 MED ORDER — FENTANYL CITRATE (PF) 250 MCG/5ML IJ SOLN
INTRAMUSCULAR | Status: AC
  Filled 2017-06-02: qty 25

## 2017-06-02 MED ORDER — ACETAMINOPHEN 500 MG PO TABS
1000.0000 mg | ORAL_TABLET | Freq: Four times a day (QID) | ORAL | Status: DC
  Administered 2017-06-02 – 2017-06-07 (×17): 1000 mg via ORAL
  Filled 2017-06-02 (×17): qty 2

## 2017-06-02 MED ORDER — SODIUM BICARBONATE 8.4 % IV SOLN
50.0000 meq | Freq: Once | INTRAVENOUS | Status: AC
  Administered 2017-06-02: 50 meq via INTRAVENOUS

## 2017-06-02 MED ORDER — CHLORHEXIDINE GLUCONATE 0.12 % MT SOLN
15.0000 mL | OROMUCOSAL | Status: AC
  Administered 2017-06-02: 15 mL via OROMUCOSAL

## 2017-06-02 MED ORDER — EPHEDRINE 5 MG/ML INJ
INTRAVENOUS | Status: AC
  Filled 2017-06-02: qty 10

## 2017-06-02 MED ORDER — PANTOPRAZOLE SODIUM 40 MG PO TBEC
40.0000 mg | DELAYED_RELEASE_TABLET | Freq: Every day | ORAL | Status: DC
  Administered 2017-06-04 – 2017-06-07 (×4): 40 mg via ORAL
  Filled 2017-06-02 (×4): qty 1

## 2017-06-02 MED ORDER — FENTANYL CITRATE (PF) 250 MCG/5ML IJ SOLN
INTRAMUSCULAR | Status: DC | PRN
  Administered 2017-06-02: 500 ug via INTRAVENOUS
  Administered 2017-06-02: 100 ug via INTRAVENOUS
  Administered 2017-06-02: 50 ug via INTRAVENOUS
  Administered 2017-06-02 (×2): 250 ug via INTRAVENOUS
  Administered 2017-06-02: 100 ug via INTRAVENOUS

## 2017-06-02 MED ORDER — PHENYLEPHRINE 40 MCG/ML (10ML) SYRINGE FOR IV PUSH (FOR BLOOD PRESSURE SUPPORT)
PREFILLED_SYRINGE | INTRAVENOUS | Status: AC
  Filled 2017-06-02: qty 10

## 2017-06-02 MED ORDER — LACTATED RINGERS IV SOLN: INTRAVENOUS | Status: DC

## 2017-06-02 MED ORDER — POTASSIUM CHLORIDE 10 MEQ/50ML IV SOLN
10.0000 meq | INTRAVENOUS | Status: AC
  Administered 2017-06-02 (×3): 10 meq via INTRAVENOUS

## 2017-06-02 MED ORDER — MORPHINE SULFATE (PF) 2 MG/ML IV SOLN
2.0000 mg | INTRAVENOUS | Status: DC | PRN
  Administered 2017-06-02 – 2017-06-04 (×2): 2 mg via INTRAVENOUS
  Filled 2017-06-02 (×2): qty 1
  Filled 2017-06-02: qty 3

## 2017-06-02 MED ORDER — DEXMEDETOMIDINE HCL IN NACL 200 MCG/50ML IV SOLN: 0.0000 ug/kg/h | INTRAVENOUS | Status: DC

## 2017-06-02 MED ORDER — PROTAMINE SULFATE 10 MG/ML IV SOLN
INTRAVENOUS | Status: DC | PRN
  Administered 2017-06-02: 240 mg via INTRAVENOUS

## 2017-06-02 MED ORDER — METOPROLOL TARTRATE 12.5 MG HALF TABLET
12.5000 mg | ORAL_TABLET | Freq: Two times a day (BID) | ORAL | Status: DC
  Administered 2017-06-03 – 2017-06-04 (×3): 12.5 mg via ORAL
  Filled 2017-06-02 (×3): qty 1

## 2017-06-02 MED ORDER — BISACODYL 5 MG PO TBEC
10.0000 mg | DELAYED_RELEASE_TABLET | Freq: Every day | ORAL | Status: DC
  Administered 2017-06-03 – 2017-06-07 (×4): 10 mg via ORAL
  Filled 2017-06-02 (×4): qty 2

## 2017-06-02 MED ORDER — 0.9 % SODIUM CHLORIDE (POUR BTL) OPTIME
TOPICAL | Status: DC | PRN
  Administered 2017-06-02: 6000 mL

## 2017-06-02 MED ORDER — LACTATED RINGERS IV SOLN
INTRAVENOUS | Status: DC | PRN
  Administered 2017-06-02 (×2): via INTRAVENOUS

## 2017-06-02 MED ORDER — TRAMADOL HCL 50 MG PO TABS
50.0000 mg | ORAL_TABLET | ORAL | Status: DC | PRN
  Administered 2017-06-04 (×2): 50 mg via ORAL
  Filled 2017-06-02 (×2): qty 1

## 2017-06-02 MED ORDER — DOCUSATE SODIUM 100 MG PO CAPS
200.0000 mg | ORAL_CAPSULE | Freq: Every day | ORAL | Status: DC
  Administered 2017-06-03 – 2017-06-07 (×5): 200 mg via ORAL
  Filled 2017-06-02 (×5): qty 2

## 2017-06-02 MED ORDER — ALBUMIN HUMAN 5 % IV SOLN
INTRAVENOUS | Status: DC | PRN
  Administered 2017-06-02: 12:00:00 via INTRAVENOUS

## 2017-06-02 MED ORDER — HEMOSTATIC AGENTS (NO CHARGE) OPTIME
TOPICAL | Status: DC | PRN
  Administered 2017-06-02 (×2): 1 via TOPICAL

## 2017-06-02 MED ORDER — ONDANSETRON HCL 4 MG/2ML IJ SOLN: 4.0000 mg | Freq: Four times a day (QID) | INTRAMUSCULAR | Status: DC | PRN

## 2017-06-02 MED ORDER — SODIUM CHLORIDE 0.9 % IV SOLN: 250.0000 mL | INTRAVENOUS | Status: DC

## 2017-06-02 MED ORDER — METOPROLOL TARTRATE 25 MG/10 ML ORAL SUSPENSION: 12.5000 mg | Freq: Two times a day (BID) | ORAL | Status: DC

## 2017-06-02 MED ORDER — SODIUM CHLORIDE 0.9 % IV SOLN
0.0000 ug/min | INTRAVENOUS | Status: DC
  Administered 2017-06-02: 40 ug/min via INTRAVENOUS
  Administered 2017-06-03: 30 ug/min via INTRAVENOUS
  Filled 2017-06-02: qty 20
  Filled 2017-06-02: qty 2
  Filled 2017-06-02: qty 20

## 2017-06-02 MED ORDER — INSULIN REGULAR BOLUS VIA INFUSION
0.0000 [IU] | Freq: Three times a day (TID) | INTRAVENOUS | Status: DC
  Filled 2017-06-02: qty 10

## 2017-06-02 MED ORDER — EPHEDRINE SULFATE-NACL 50-0.9 MG/10ML-% IV SOSY
PREFILLED_SYRINGE | INTRAVENOUS | Status: DC | PRN
  Administered 2017-06-02 (×2): 5 mg via INTRAVENOUS

## 2017-06-02 MED ORDER — SODIUM CHLORIDE 0.9 % IV SOLN
INTRAVENOUS | Status: DC | PRN
  Administered 2017-06-02: 13:00:00 via INTRAVENOUS

## 2017-06-02 MED ORDER — SODIUM CHLORIDE 0.9% FLUSH: 3.0000 mL | INTRAVENOUS | Status: DC | PRN

## 2017-06-02 MED ORDER — SODIUM CHLORIDE 0.9 % IJ SOLN
OROMUCOSAL | Status: DC | PRN
  Administered 2017-06-02 (×3): 4 mL via TOPICAL

## 2017-06-02 MED ORDER — HEPARIN SODIUM (PORCINE) 1000 UNIT/ML IJ SOLN
INTRAMUSCULAR | Status: AC
  Filled 2017-06-02: qty 1

## 2017-06-02 MED ORDER — SODIUM CHLORIDE 0.9% FLUSH: 10.0000 mL | INTRAVENOUS | Status: DC | PRN

## 2017-06-02 MED ORDER — ARTIFICIAL TEARS OPHTHALMIC OINT
TOPICAL_OINTMENT | OPHTHALMIC | Status: AC
  Filled 2017-06-02: qty 7

## 2017-06-02 MED ORDER — ALBUMIN HUMAN 5 % IV SOLN
250.0000 mL | INTRAVENOUS | Status: AC | PRN
  Administered 2017-06-02 (×3): 250 mL via INTRAVENOUS
  Filled 2017-06-02 (×2): qty 250

## 2017-06-02 MED ORDER — ACETAMINOPHEN 160 MG/5ML PO SOLN: 1000.0000 mg | Freq: Four times a day (QID) | ORAL | Status: DC

## 2017-06-02 MED ORDER — MIDAZOLAM HCL 10 MG/2ML IJ SOLN
INTRAMUSCULAR | Status: AC
  Filled 2017-06-02: qty 2

## 2017-06-02 MED ORDER — ASPIRIN 81 MG PO CHEW: 324.0000 mg | CHEWABLE_TABLET | Freq: Every day | ORAL | Status: DC

## 2017-06-02 MED ORDER — MORPHINE SULFATE (PF) 2 MG/ML IV SOLN
1.0000 mg | INTRAVENOUS | Status: DC | PRN
  Administered 2017-06-02 (×2): 2 mg via INTRAVENOUS
  Filled 2017-06-02: qty 1

## 2017-06-02 MED ORDER — SODIUM CHLORIDE 0.9% FLUSH
3.0000 mL | Freq: Two times a day (BID) | INTRAVENOUS | Status: DC
  Administered 2017-06-03 – 2017-06-06 (×5): 3 mL via INTRAVENOUS

## 2017-06-02 MED ORDER — SODIUM CHLORIDE 0.9% FLUSH
10.0000 mL | Freq: Two times a day (BID) | INTRAVENOUS | Status: DC
  Administered 2017-06-02 – 2017-06-04 (×3): 10 mL
  Administered 2017-06-05: 20 mL

## 2017-06-02 MED ORDER — CALCIUM CHLORIDE 10 % IV SOLN
INTRAVENOUS | Status: AC
  Filled 2017-06-02: qty 10

## 2017-06-02 MED ORDER — LACTATED RINGERS IV SOLN: 500.0000 mL | Freq: Once | INTRAVENOUS | Status: DC | PRN

## 2017-06-02 MED ORDER — ROCURONIUM BROMIDE 10 MG/ML (PF) SYRINGE
PREFILLED_SYRINGE | INTRAVENOUS | Status: AC
  Filled 2017-06-02: qty 10

## 2017-06-02 MED ORDER — FAMOTIDINE IN NACL 20-0.9 MG/50ML-% IV SOLN
20.0000 mg | Freq: Two times a day (BID) | INTRAVENOUS | Status: AC
  Administered 2017-06-02 (×2): 20 mg via INTRAVENOUS
  Filled 2017-06-02: qty 50

## 2017-06-02 MED ORDER — VANCOMYCIN HCL IN DEXTROSE 1-5 GM/200ML-% IV SOLN
1000.0000 mg | Freq: Once | INTRAVENOUS | Status: AC
  Administered 2017-06-02: 1000 mg via INTRAVENOUS
  Filled 2017-06-02: qty 200

## 2017-06-02 MED ORDER — OXYCODONE HCL 5 MG PO TABS
5.0000 mg | ORAL_TABLET | ORAL | Status: DC | PRN
  Administered 2017-06-03 (×2): 5 mg via ORAL
  Filled 2017-06-02 (×2): qty 1

## 2017-06-02 MED ORDER — MILRINONE LACTATE IN DEXTROSE 20-5 MG/100ML-% IV SOLN
0.1250 ug/kg/min | INTRAVENOUS | Status: AC
  Administered 2017-06-02: 0.25 ug/kg/min via INTRAVENOUS
  Filled 2017-06-02: qty 100

## 2017-06-02 MED ORDER — SODIUM CHLORIDE 0.9 % IV SOLN
1.5000 g | Freq: Two times a day (BID) | INTRAVENOUS | Status: AC
  Administered 2017-06-02 – 2017-06-04 (×4): 1.5 g via INTRAVENOUS
  Filled 2017-06-02 (×4): qty 1.5

## 2017-06-02 MED ORDER — SODIUM CHLORIDE 0.45 % IV SOLN
INTRAVENOUS | Status: DC | PRN
  Administered 2017-06-04: 16:00:00 via INTRAVENOUS

## 2017-06-02 MED ORDER — BISACODYL 10 MG RE SUPP: 10.0000 mg | Freq: Every day | RECTAL | Status: DC

## 2017-06-02 MED ORDER — ASPIRIN EC 325 MG PO TBEC
325.0000 mg | DELAYED_RELEASE_TABLET | Freq: Every day | ORAL | Status: DC
  Administered 2017-06-03 – 2017-06-04 (×2): 325 mg via ORAL
  Filled 2017-06-02 (×2): qty 1

## 2017-06-02 MED ORDER — CALCIUM CHLORIDE 10 % IV SOLN
INTRAVENOUS | Status: DC | PRN
  Administered 2017-06-02: 100 mg via INTRAVENOUS

## 2017-06-02 MED ORDER — PROPOFOL 10 MG/ML IV BOLUS
INTRAVENOUS | Status: AC
  Filled 2017-06-02: qty 20

## 2017-06-02 MED ORDER — POTASSIUM CHLORIDE 10 MEQ/50ML IV SOLN
10.0000 meq | INTRAVENOUS | Status: AC
  Administered 2017-06-02 (×3): 10 meq via INTRAVENOUS
  Filled 2017-06-02 (×3): qty 50

## 2017-06-02 MED ORDER — PROPOFOL 10 MG/ML IV BOLUS
INTRAVENOUS | Status: DC | PRN
  Administered 2017-06-02: 20 mg via INTRAVENOUS

## 2017-06-02 MED ORDER — METOCLOPRAMIDE HCL 5 MG/ML IJ SOLN
10.0000 mg | Freq: Four times a day (QID) | INTRAMUSCULAR | Status: DC
  Administered 2017-06-02 – 2017-06-07 (×19): 10 mg via INTRAVENOUS
  Filled 2017-06-02 (×18): qty 2

## 2017-06-02 MED ORDER — LACTATED RINGERS IV SOLN
INTRAVENOUS | Status: DC | PRN
  Administered 2017-06-02: 07:00:00 via INTRAVENOUS

## 2017-06-02 MED ORDER — PAPAVERINE HCL 30 MG/ML IJ SOLN
INTRAMUSCULAR | Status: DC | PRN
  Administered 2017-06-02: 500 mL via INTRAVASCULAR

## 2017-06-02 MED ORDER — MIDAZOLAM HCL 2 MG/2ML IJ SOLN
2.0000 mg | INTRAMUSCULAR | Status: DC | PRN
  Administered 2017-06-02: 2 mg via INTRAVENOUS
  Filled 2017-06-02 (×2): qty 2

## 2017-06-02 MED ORDER — MAGNESIUM SULFATE 4 GM/100ML IV SOLN
4.0000 g | Freq: Once | INTRAVENOUS | Status: AC
  Administered 2017-06-02: 4 g via INTRAVENOUS
  Filled 2017-06-02: qty 100

## 2017-06-02 MED ORDER — MILRINONE LACTATE IN DEXTROSE 20-5 MG/100ML-% IV SOLN
0.2500 ug/kg/min | INTRAVENOUS | Status: DC
  Administered 2017-06-03: 0.25 ug/kg/min via INTRAVENOUS
  Filled 2017-06-02: qty 100

## 2017-06-02 MED ORDER — ROCURONIUM BROMIDE 10 MG/ML (PF) SYRINGE
PREFILLED_SYRINGE | INTRAVENOUS | Status: DC | PRN
  Administered 2017-06-02 (×4): 50 mg via INTRAVENOUS

## 2017-06-02 MED ORDER — MIDAZOLAM HCL 5 MG/5ML IJ SOLN
INTRAMUSCULAR | Status: DC | PRN
  Administered 2017-06-02: 2 mg via INTRAVENOUS
  Administered 2017-06-02 (×2): 1 mg via INTRAVENOUS
  Administered 2017-06-02 (×2): 2 mg via INTRAVENOUS
  Administered 2017-06-02: 1 mg via INTRAVENOUS

## 2017-06-02 MED ORDER — HEPARIN SODIUM (PORCINE) 1000 UNIT/ML IJ SOLN
INTRAMUSCULAR | Status: DC | PRN
  Administered 2017-06-02: 2000 [IU] via INTRAVENOUS
  Administered 2017-06-02: 20000 [IU] via INTRAVENOUS
  Administered 2017-06-02: 2000 [IU] via INTRAVENOUS

## 2017-06-02 MED ORDER — METOPROLOL TARTRATE 5 MG/5ML IV SOLN: 2.5000 mg | INTRAVENOUS | Status: DC | PRN

## 2017-06-02 MED ORDER — PROTAMINE SULFATE 10 MG/ML IV SOLN
INTRAVENOUS | Status: AC
  Filled 2017-06-02: qty 25

## 2017-06-02 MED ORDER — PHENYLEPHRINE 40 MCG/ML (10ML) SYRINGE FOR IV PUSH (FOR BLOOD PRESSURE SUPPORT)
PREFILLED_SYRINGE | INTRAVENOUS | Status: DC | PRN
  Administered 2017-06-02 (×3): 40 ug via INTRAVENOUS

## 2017-06-02 MED ORDER — ACETAMINOPHEN 650 MG RE SUPP: 650.0000 mg | Freq: Once | RECTAL | Status: AC

## 2017-06-02 MED ORDER — ARTIFICIAL TEARS OPHTHALMIC OINT
TOPICAL_OINTMENT | OPHTHALMIC | Status: DC | PRN
  Administered 2017-06-02: 1 via OPHTHALMIC

## 2017-06-02 MED ORDER — SODIUM CHLORIDE 0.9 % IV SOLN
INTRAVENOUS | Status: DC
  Filled 2017-06-02: qty 1

## 2017-06-02 MED ORDER — SODIUM CHLORIDE 0.9 % IV SOLN: INTRAVENOUS | Status: DC

## 2017-06-02 MED FILL — Heparin Sod (Porcine)-NaCl IV Soln 1000 Unit/500ML-0.9%: INTRAVENOUS | Qty: 1000 | Status: AC

## 2017-06-02 SURGICAL SUPPLY — 97 items
ADAPTER CARDIO PERF ANTE/RETRO (ADAPTER) ×4 IMPLANT
ADH SKN CLS APL DERMABOND .7 (GAUZE/BANDAGES/DRESSINGS) ×2
ADPR PRFSN 84XANTGRD RTRGD (ADAPTER) ×2
BAG DECANTER FOR FLEXI CONT (MISCELLANEOUS) ×4 IMPLANT
BANDAGE ACE 4X5 VEL STRL LF (GAUZE/BANDAGES/DRESSINGS) ×4 IMPLANT
BANDAGE ACE 6X5 VEL STRL LF (GAUZE/BANDAGES/DRESSINGS) ×4 IMPLANT
BASKET HEART  (ORDER IN 25'S) (MISCELLANEOUS) ×1
BASKET HEART (ORDER IN 25'S) (MISCELLANEOUS) ×1
BASKET HEART (ORDER IN 25S) (MISCELLANEOUS) ×2 IMPLANT
BLADE CLIPPER SURG (BLADE) IMPLANT
BLADE STERNUM SYSTEM 6 (BLADE) ×4 IMPLANT
BLADE SURG 12 STRL SS (BLADE) ×8 IMPLANT
BNDG GAUZE ELAST 4 BULKY (GAUZE/BANDAGES/DRESSINGS) ×4 IMPLANT
CANISTER SUCT 3000ML PPV (MISCELLANEOUS) ×4 IMPLANT
CANNULA GUNDRY RCSP 15FR (MISCELLANEOUS) ×4 IMPLANT
CATH CPB KIT VANTRIGT (MISCELLANEOUS) ×4 IMPLANT
CATH ROBINSON RED A/P 18FR (CATHETERS) ×12 IMPLANT
CATH THORACIC 36FR RT ANG (CATHETERS) ×4 IMPLANT
CLIP VESOCCLUDE SM WIDE 24/CT (CLIP) ×4 IMPLANT
CRADLE DONUT ADULT HEAD (MISCELLANEOUS) ×4 IMPLANT
DERMABOND ADVANCED (GAUZE/BANDAGES/DRESSINGS) ×2
DERMABOND ADVANCED .7 DNX12 (GAUZE/BANDAGES/DRESSINGS) IMPLANT
DRAIN CHANNEL 32F RND 10.7 FF (WOUND CARE) ×4 IMPLANT
DRAPE CARDIOVASCULAR INCISE (DRAPES) ×4
DRAPE SLUSH/WARMER DISC (DRAPES) ×4 IMPLANT
DRAPE SRG 135X102X78XABS (DRAPES) ×2 IMPLANT
DRSG AQUACEL AG ADV 3.5X14 (GAUZE/BANDAGES/DRESSINGS) ×4 IMPLANT
ELECT BLADE 4.0 EZ CLEAN MEGAD (MISCELLANEOUS) ×4
ELECT BLADE 6.5 EXT (BLADE) ×4 IMPLANT
ELECT CAUTERY BLADE 6.4 (BLADE) ×4 IMPLANT
ELECT REM PT RETURN 9FT ADLT (ELECTROSURGICAL) ×8
ELECTRODE BLDE 4.0 EZ CLN MEGD (MISCELLANEOUS) ×2 IMPLANT
ELECTRODE REM PT RTRN 9FT ADLT (ELECTROSURGICAL) ×4 IMPLANT
FELT TEFLON 1X6 (MISCELLANEOUS) ×8 IMPLANT
GAUZE SPONGE 4X4 12PLY STRL (GAUZE/BANDAGES/DRESSINGS) ×8 IMPLANT
GLOVE BIO SURGEON STRL SZ 6.5 (GLOVE) ×1 IMPLANT
GLOVE BIO SURGEON STRL SZ7.5 (GLOVE) ×12 IMPLANT
GLOVE BIO SURGEONS STRL SZ 6.5 (GLOVE) ×1
GOWN STRL REUS W/ TWL LRG LVL3 (GOWN DISPOSABLE) ×8 IMPLANT
GOWN STRL REUS W/TWL LRG LVL3 (GOWN DISPOSABLE) ×16
HEMOSTAT POWDER SURGIFOAM 1G (HEMOSTASIS) ×12 IMPLANT
HEMOSTAT SURGICEL 2X14 (HEMOSTASIS) ×4 IMPLANT
INSERT FOGARTY XLG (MISCELLANEOUS) IMPLANT
KIT BASIN OR (CUSTOM PROCEDURE TRAY) ×4 IMPLANT
KIT SUCTION CATH 14FR (SUCTIONS) ×4 IMPLANT
KIT TURNOVER KIT B (KITS) ×4 IMPLANT
KIT VASOVIEW HEMOPRO VH 3000 (KITS) ×4 IMPLANT
LEAD PACING MYOCARDI (MISCELLANEOUS) ×4 IMPLANT
MARKER GRAFT CORONARY BYPASS (MISCELLANEOUS) ×12 IMPLANT
NS IRRIG 1000ML POUR BTL (IV SOLUTION) ×20 IMPLANT
PACK E OPEN HEART (SUTURE) ×4 IMPLANT
PACK OPEN HEART (CUSTOM PROCEDURE TRAY) ×4 IMPLANT
PAD ARMBOARD 7.5X6 YLW CONV (MISCELLANEOUS) ×8 IMPLANT
PAD ELECT DEFIB RADIOL ZOLL (MISCELLANEOUS) ×4 IMPLANT
PENCIL BUTTON HOLSTER BLD 10FT (ELECTRODE) ×4 IMPLANT
PUNCH AORTIC ROTATE 4.0MM (MISCELLANEOUS) IMPLANT
PUNCH AORTIC ROTATE 4.5MM 8IN (MISCELLANEOUS) ×2 IMPLANT
PUNCH AORTIC ROTATE 5MM 8IN (MISCELLANEOUS) IMPLANT
SET CARDIOPLEGIA MPS 5001102 (MISCELLANEOUS) ×2 IMPLANT
SPONGE LAP 18X18 X RAY DECT (DISPOSABLE) ×4 IMPLANT
SPONGE LAP 4X18 X RAY DECT (DISPOSABLE) ×2 IMPLANT
SUT BONE WAX W31G (SUTURE) ×4 IMPLANT
SUT MNCRL AB 4-0 PS2 18 (SUTURE) ×4 IMPLANT
SUT PROLENE 3 0 SH DA (SUTURE) IMPLANT
SUT PROLENE 3 0 SH1 36 (SUTURE) IMPLANT
SUT PROLENE 4 0 RB 1 (SUTURE) ×16
SUT PROLENE 4 0 SH DA (SUTURE) ×6 IMPLANT
SUT PROLENE 4-0 RB1 .5 CRCL 36 (SUTURE) ×2 IMPLANT
SUT PROLENE 5 0 C 1 36 (SUTURE) IMPLANT
SUT PROLENE 6 0 C 1 30 (SUTURE) ×4 IMPLANT
SUT PROLENE 6 0 CC (SUTURE) ×12 IMPLANT
SUT PROLENE 8 0 BV175 6 (SUTURE) ×2 IMPLANT
SUT PROLENE BLUE 7 0 (SUTURE) ×4 IMPLANT
SUT PROLENE POLY MONO (SUTURE) ×2 IMPLANT
SUT SILK  1 MH (SUTURE)
SUT SILK 1 MH (SUTURE) IMPLANT
SUT SILK 2 0 SH CR/8 (SUTURE) IMPLANT
SUT SILK 3 0 SH CR/8 (SUTURE) IMPLANT
SUT STEEL 6MS V (SUTURE) ×8 IMPLANT
SUT STEEL SZ 6 DBL 3X14 BALL (SUTURE) ×4 IMPLANT
SUT VIC AB 1 CTX 36 (SUTURE) ×8
SUT VIC AB 1 CTX36XBRD ANBCTR (SUTURE) ×4 IMPLANT
SUT VIC AB 2-0 CT1 27 (SUTURE) ×4
SUT VIC AB 2-0 CT1 36 (SUTURE) ×2 IMPLANT
SUT VIC AB 2-0 CT1 TAPERPNT 27 (SUTURE) IMPLANT
SUT VIC AB 2-0 CTX 27 (SUTURE) IMPLANT
SUT VIC AB 3-0 X1 27 (SUTURE) IMPLANT
SYR 5ML LL (SYRINGE) ×2 IMPLANT
SYSTEM SAHARA CHEST DRAIN ATS (WOUND CARE) ×4 IMPLANT
TAPE CLOTH SURG 4X10 WHT LF (GAUZE/BANDAGES/DRESSINGS) ×2 IMPLANT
TAPE PAPER 2X10 WHT MICROPORE (GAUZE/BANDAGES/DRESSINGS) ×2 IMPLANT
TOWEL GREEN STERILE (TOWEL DISPOSABLE) ×4 IMPLANT
TOWEL GREEN STERILE FF (TOWEL DISPOSABLE) ×4 IMPLANT
TRAY FOLEY SILVER 16FR TEMP (SET/KITS/TRAYS/PACK) ×4 IMPLANT
TUBING INSUFFLATION (TUBING) ×4 IMPLANT
UNDERPAD 30X30 (UNDERPADS AND DIAPERS) ×4 IMPLANT
WATER STERILE IRR 1000ML POUR (IV SOLUTION) ×8 IMPLANT

## 2017-06-02 NOTE — Progress Notes (Signed)
  Echocardiogram Echocardiogram Transesophageal has been performed.  Roosvelt Maser F 06/02/2017, 9:52 AM

## 2017-06-02 NOTE — Anesthesia Procedure Notes (Signed)
Central Venous Catheter Insertion Performed by: Kipp Brood, MD, anesthesiologist Start/End4/22/2019 7:25 AM, 06/02/2017 7:35 AM Patient location: Pre-op. Preanesthetic checklist: patient identified, IV checked, site marked, risks and benefits discussed, surgical consent, monitors and equipment checked, pre-op evaluation, timeout performed and anesthesia consent Lidocaine 1% used for infiltration and patient sedated Hand hygiene performed  and maximum sterile barriers used  Catheter size: 9 Fr Sheath introducer Procedure performed using ultrasound guided technique. Ultrasound Notes:anatomy identified, needle tip was noted to be adjacent to the nerve/plexus identified, no ultrasound evidence of intravascular and/or intraneural injection and image(s) printed for medical record Attempts: 1 Following insertion, line sutured and dressing applied. Post procedure assessment: blood return through all ports, free fluid flow and no air  Patient tolerated the procedure well with no immediate complications.

## 2017-06-02 NOTE — Progress Notes (Signed)
Patient ID: Wayne Mitchell, male   DOB: [birth year], 82 y.o.   MRN: [id number] EVENING ROUNDS NOTE :     [address]       [name] [address]             [phone]                 Day of Surgery Procedure(s) (LRB): CORONARY ARTERY BYPASS GRAFTING (CABG) x3 Using Left Internal Mammary Artery and Right Leg Greater Saphenous Vein harvested endoscopically (N/A) TRANSESOPHAGEAL ECHOCARDIOGRAM (TEE) (N/A)  Total Length of Stay:  LOS: 3 days  BP (!) 105/51   Pulse 84   Temp 97.7 F (36.5 C)   Resp 17   Ht 5\' 7"  (1.702 m)   Wt 155 lb 6.4 oz (70.5 kg)   SpO2 100%   BMI 24.34 kg/m   .Intake/Output      04/21 0701 - 04/22 0700 04/22 0701 - 04/23 0700   P.O. 940    I.V. (mL/kg) 1125.4 (16) 2929.7 (41.6)   Blood  682   IV Piggyback 0 1300   Total Intake(mL/kg) 2065.4 (29.3) 4911.7 (69.7)   Urine (mL/kg/hr) 2125 (1.3) 1055 (1.3)   Stool 1    Blood  500   Chest Tube  250   Total Output 2126 1805   Net -60.6 +3106.7        Urine Occurrence 1 x      . sodium chloride 20 mL/hr at 06/02/17 1800  . [START ON 06/03/2017] sodium chloride    . sodium chloride 20 mL/hr at 06/02/17 1800  . albumin human    . cefUROXime (ZINACEF)  IV    . dexmedetomidine (PRECEDEX) IV infusion 0 mcg/kg/hr (06/02/17 1530)  . famotidine (PEPCID) IV Stopped (06/02/17 1353)  . insulin (NOVOLIN-R) infusion 1.6 Units/hr (06/02/17 1322)  . lactated ringers    . lactated ringers 20 mL/hr at 06/02/17 1800  . lactated ringers 20 mL/hr at 06/02/17 1800  . magnesium sulfate 4 g (06/02/17 1400)  . milrinone 0.25 mcg/kg/min (06/02/17 1800)  . nitroGLYCERIN 0 mcg/min (06/02/17 1322)  . phenylephrine (NEO-SYNEPHRINE) Adult infusion 50 mcg/min (06/02/17 1800)  . vancomycin       Lab Results  Component Value Date   WBC 3.5 (L) 06/02/2017   HGB 8.5 (L) 06/02/2017   HCT 25.0 (L) 06/02/2017   PLT 80 (L) 06/02/2017   GLUCOSE 106 (H) 06/02/2017   CHOL 130 05/31/2017   TRIG 63 05/31/2017   HDL  56 05/31/2017   LDLCALC 61 05/31/2017   ALT 14 (L) 05/31/2017   AST 16 05/31/2017   NA 142 06/02/2017   K 3.6 06/02/2017   CL 103 06/01/2017   CREATININE 1.41 (H) 06/01/2017   BUN 16 06/01/2017   CO2 22 06/01/2017   TSH 2.498 08/06/2015   INR 1.42 06/02/2017   HGBA1C 5.6 05/31/2017   On milrinone and neo, CI =2.3 Not bleeding, total 300 from chest tubes Extubated , neuro intact     [name] MD  Beeper [phone] Office [phone] 06/02/2017 6:55 PM

## 2017-06-02 NOTE — Anesthesia Procedure Notes (Signed)
Central Venous Catheter Insertion Performed by: Kipp Brood, MD, anesthesiologist Start/End4/22/2019 7:25 AM, 06/02/2017 7:35 AM Patient location: Pre-op. Preanesthetic checklist: patient identified, IV checked, site marked, risks and benefits discussed, surgical consent, monitors and equipment checked, pre-op evaluation, timeout performed and anesthesia consent Hand hygiene performed  and maximum sterile barriers used  PA cath was placed.Swan type:thermodilution Procedure performed without using ultrasound guided technique. Attempts: 1 Patient tolerated the procedure well with no immediate complications.

## 2017-06-02 NOTE — Anesthesia Procedure Notes (Signed)
Arterial Line Insertion Start/End4/22/2019 7:25 AM, 06/02/2017 7:35 AM Performed by: Rachel Moulds, CRNA, CRNA  Patient location: Pre-op. Preanesthetic checklist: patient identified, IV checked, site marked, risks and benefits discussed, surgical consent, monitors and equipment checked, pre-op evaluation and timeout performed Lidocaine 1% used for infiltration and patient sedated Left, radial was placed Catheter size: 20 G Hand hygiene performed  and maximum sterile barriers used  Allen's test indicative of satisfactory collateral circulation Attempts: 3 Procedure performed without using ultrasound guided technique. Following insertion, Biopatch and dressing applied. Post procedure assessment: normal  Patient tolerated the procedure well with no immediate complications.

## 2017-06-02 NOTE — Progress Notes (Signed)
This note also relates to the following rows which could not be included: SpO2 - Cannot attach notes to unvalidated device data  Rapid weaning protocol  

## 2017-06-02 NOTE — Op Note (Signed)
NAMEDALLYN, BERGLAND                ACCOUNT NO.:  [id number]  MEDICAL RECORD NO.:  [id number]  LOCATION:  [id number]                        FACILITY:  [facility]  PHYSICIAN:  Kerin Perna, M.D.  DATE OF BIRTH:  November 12, 1935  DATE OF PROCEDURE:  06/02/2017 DATE OF DISCHARGE:                              OPERATIVE REPORT   OPERATION: 1. Coronary artery bypass grafting x4 (left internal mammary artery to     left anterior descending, saphenous vein graft to diagonal,     saphenous vein graft to circumflex marginal). 2. Endoscopic harvest of right leg greater saphenous vein.  SURGEON:  Kerin Perna, M.D.  ASSISTANT:  Doree Fudge, PA-C.  PREOPERATIVE DIAGNOSES:  Unstable angina with severe multivessel coronary artery disease and critical 99% proximal LAD stenosis at the origin.  POSTOPERATIVE DIAGNOSES:  Unstable angina with severe multivessel coronary artery disease and critical 99% proximal LAD stenosis at the origin.  CLINICAL NOTE:  The patient is an 82 year old gentleman with symptoms of unstable angina over the previous several weeks.  He was evaluated by Cardiology and underwent cardiac catheterization which demonstrated an ostial 99% LAD stenosis.  There was also an ostial 80-90% stenosis of the second diagonal and a high-grade 90% stenosis of the large OM-1. The patient was felt to be candidate for surgical coronary revascularization.  The patient was initially seen by Dr. Cornelius Moras who agreed with the recommendation for multivessel CABG.  I was asked to see the patient subsequently and examined the patient after reviewing his coronary arteriograms.  I discussed the situation with the patient and his wife, agreeing that multivessel CABG was the best long-term therapy. We discussed the details of CABG including the use of general anesthesia and cardiopulmonary bypass, location of the surgical incisions, and the expected postoperative hospital recovery.  I discussed the risks  with the patient including the risks of stroke, MI, bleeding, blood transfusion requirement, postoperative infection, postoperative organ failure, postoperative pulmonary problems including pleural effusion, infection, and death.  After reviewing these issues, he demonstrated his understanding and agreed to proceed with surgery under what I felt was an informed consent.  OPERATIVE FINDINGS: 1. Adequate conduit. 2. Low platelet count, dropped to 65,000 after cardiopulmonary bypass,     which was treated with one unit of platelet transfusion. 3. Improved global LV function after separation from cardiopulmonary     bypass by transesophageal echo.  DESCRIPTION OF PROCEDURE:  The patient was brought to the operating room and placed supine on the operating table.  General anesthesia was induced under invasive hemodynamic monitoring.  The chest, abdomen, and legs were prepped with Betadine and draped as a sterile field.  A proper time-out was performed.  A sternal incision was made as the vein was harvested endoscopically from the right leg.  The left internal mammary artery was harvested as a pedicle graft from its origin at the subclavian vessels.  It was a 1.5-mm vessel with good flow.  The sternal retractor was placed, and the pericardium was opened and suspended. Pursestrings were placed in the ascending aorta and right atrium. Heparin was administered, and the patient was cannulated and placed on cardiopulmonary bypass.  The coronaries were identified  for grafting, and the mammary artery and vein grafts were prepared for the distal anastomoses.  Cardioplegia cannulas were placed both antegrade and retrograde cold blood cardioplegia.  The patient was cooled to 32 degrees and the aortic crossclamp was applied.  One liter of cold blood cardioplegia was delivered in split doses between the antegrade aortic and retrograde coronary sinus catheters.  There was good cardioplegic arrest.   Septal temperature dropped less than 15 degrees.  Cardioplegia was delivered every 20 minutes or less.  Topical cooling was achieved with ice slush.  The distal coronary anastomoses were performed.  The first distal anastomosis was to the OM branch of the circumflex.  This had a proximal 80-90% stenosis.  A reversed saphenous vein was sewn end-to-side with running 7-0 Prolene and there was good flow through the graft. Cardioplegia was then delivered.  The second distal anastomosis was to the second diagonal branch of the LAD.  This was a 1.4-mm vessel with proximal ostial 80% stenosis.  A reversed saphenous vein was sewn end-to-side with running 7-0 Prolene. There was good flow through the graft.  Cardioplegia was redosed.  The third distal anastomosis was to the distal third of the LAD.  This was a 1.5-mm vessel with proximal 99% stenosis.  The left IMA pedicle was brought through an opening, and the left lateral pericardium was brought down onto the LAD and sewn end-to-side with running 8-0 Prolene. There was good flow through the anastomosis after briefly releasing the vascular bulldog on the mammary pedicle.  The bulldog was reapplied and the pedicle was secured to the epicardium.  While the crossclamp was still in place, 2 proximal vein anastomoses were performed on the ascending aorta using a 4.5-mm punch running 6-0 Prolene.  Prior to removing the cross-clamp, air was vented from the coronaries with a dose of retrograde warm blood cardioplegia.  The cross-clamp was removed.  The vein grafts were de-aired and opened and each had good flow and hemostasis was documented at the proximal and distal anastomoses.  The cardioplegia cannulas were removed.  Temporary pacing wires were applied.  The patient was rewarmed and reperfused. The lungs were expanded and the ventilator was resumed.  After the patient was rewarmed and adequately reperfused, he was weaned from cardiopulmonary  bypass with low-dose milrinone.  Global LV function was improved from preop.  Protamine was administered to reverse the heparin. There was no adverse effects.  There was still diffuse coagulopathy, and because of his platelet count being 60,000, he was given 1 units of platelets.  The mediastinum was irrigated.  The superior pericardial fat was closed over the aorta.  Anterior mediastinal and left pleural chest tubes were placed and brought out through separate incisions.  The sternum was closed with wire.  The patient remained stable.  The pectoralis fascia was closed with a running #1 Vicryl and the subcutaneous and skin layers were closed running Vicryl.  Total cardiopulmonary bypass time was 95 minutes.     Kerin Perna, M.D.    PV/MEDQ  D:  06/02/2017  T:  06/02/2017  Job:  [id number]

## 2017-06-02 NOTE — Brief Op Note (Signed)
05/30/2017 - 06/02/2017  11:37 AM  PATIENT:  Wayne Mitchell  82 y.o. male  PRE-OPERATIVE DIAGNOSIS:  1. Unstable Angina 2. Coronary artery disease  POST-OPERATIVE DIAGNOSIS:  1. Unstable Angina 2. Coronary artery disease  PROCEDURE:  TRANSESOPHAGEAL ECHOCARDIOGRAM (TEE), MEDIAN STERNOTOMY for CORONARY ARTERY BYPASS GRAFTING (CABG) x 3 (LIMA to LAD, SVG to DIAGONAL, SVG to OM) with EVH from RIGHT GREATER SAPHENOUS VEIN and Left internal mammary artery harvest  SURGEON:  Surgeon(s) and Role:    Kerin Perna, MD - Primary  PHYSICIAN ASSISTANT: Doree Fudge PA-C  ASSISTANTS: Virgilio Frees RNFA   ANESTHESIA:   general  EBL:  450 cc   BLOOD ADMINISTERED:One FFP and one PLTS  DRAINS: Chest tubes placed in the mediastinal and pleural spaces   COUNTS CORRECT:  YES  DICTATION: .Dragon Dictation  PLAN OF CARE: Admit to inpatient   PATIENT DISPOSITION:  ICU - intubated and hemodynamically stable.   Delay start of Pharmacological VTE agent (>24hrs) due to surgical blood loss or risk of bleeding: yes  BASELINE WEIGHT: 70.5 kg

## 2017-06-02 NOTE — Progress Notes (Signed)
Pt wanted to try to get up, RN helped pt dangle on the side of the bed and pt felt very dizzy, like they were going to pass out again, RN helped pt to lie down in the bed and the dizziness stopped, pt stated they felt better when they were lying instead of sitting, will continue to monitor, Thanks Lavonda Jumbo RN

## 2017-06-02 NOTE — Procedures (Signed)
Extubation Procedure Note  Patient Details:   Name: Wayne Mitchell DOB: [birth year] MRN: [id number]   Airway Documentation:  Airway 8 mm (Active)  Secured at (cm) 24 cm 06/02/2017  1:21 PM  Measured From Lips 06/02/2017  1:21 PM  Secured Location Right 06/02/2017  1:21 PM  Secured By Pink Tape 06/02/2017  1:21 PM  Site Condition Dry 06/02/2017  1:21 PM   Vent end date: (not recorded) Vent end time: (not recorded)   Evaluation  O2 sats: stable throughout Complications: No apparent complications Patient did tolerate procedure well. Bilateral Breath Sounds: Clear, Diminished   Yes   Patient extubated per protocol to 4L [place] with no apparent complications. Positive cuff leak was noted prior to extubation. Patient achieved NIF of -30 and VC of .90 liters. Vitals are stable and sats are 100%. Patient is alert and oriented and is able to speak. RT will continue to monitor.   [name] 06/02/2017, 6:28 PM

## 2017-06-02 NOTE — Transfer of Care (Signed)
Immediate Anesthesia Transfer of Care Note  Patient: Wayne Mitchell  Procedure(s) Performed: CORONARY ARTERY BYPASS GRAFTING (CABG) x3 Using Left Internal Mammary Artery and Right Leg Greater Saphenous Vein harvested endoscopically (N/A Chest) TRANSESOPHAGEAL ECHOCARDIOGRAM (TEE) (N/A )  Patient Location: SICU  Anesthesia Type:General  Level of Consciousness: Patient remains intubated per anesthesia plan  Airway & Oxygen Therapy: Patient remains intubated per anesthesia plan and Patient placed on Ventilator (see vital sign flow sheet for setting)  Post-op Assessment: Report given to RN and Post -op Vital signs reviewed and stable  Post vital signs: Reviewed and stable  Last Vitals:  Vitals Value Taken Time  BP 120/47 06/02/2017  1:21 PM  Temp    Pulse 80 06/02/2017  1:28 PM  Resp 12 06/02/2017  1:28 PM  SpO2 100 % 06/02/2017  1:28 PM  Vitals shown include unvalidated device data.  Last Pain:  Vitals:   06/02/17 0516  TempSrc: Oral  PainSc:          Complications: No apparent anesthesia complications

## 2017-06-02 NOTE — Anesthesia Procedure Notes (Signed)
Procedure Name: Intubation Date/Time: 06/02/2017 8:06 AM Performed by: Wilburn Cornelia, CRNA Pre-anesthesia Checklist: Patient identified, Emergency Drugs available, Patient being monitored, Timeout performed and Suction available Patient Re-evaluated:Patient Re-evaluated prior to induction Oxygen Delivery Method: Circle system utilized Preoxygenation: Pre-oxygenation with 100% oxygen Induction Type: IV induction Ventilation: Mask ventilation without difficulty Grade View: Grade IV Tube type: Oral Tube size: 8.0 mm Number of attempts: 3 Airway Equipment and Method: Stylet and Bougie stylet Placement Confirmation: ETT inserted through vocal cords under direct vision,  positive ETCO2,  CO2 detector and breath sounds checked- equal and bilateral Secured at: 24 cm Tube secured with: Tape Dental Injury: Teeth and Oropharynx as per pre-operative assessment  Comments: DLx2 by CRNA - grade 4 view - attempted placement with bougie and MAC 4 , DLx1 by Dr. Glennon Mac - intubated atraumatically, placement confirmed.

## 2017-06-02 NOTE — Progress Notes (Signed)
Pt had a questionable seizure right after pt had a lab draw, pt started to feel faint, and sick on his stomach and then started to pass out and appeared to have a seizure, HR went to 38 during the incident, then went back up to 50's after incident VS were 114/102, HR 52, pt seemed to have seizure activity for 15-30sec, then came to.  MD notified, will continue to monitor, Thanks, Lavonda Jumbo RN

## 2017-06-02 NOTE — Progress Notes (Signed)
Pre Procedure note for inpatients:   Wayne Mitchell has been scheduled for Procedure(s): CORONARY ARTERY BYPASS GRAFTING (CABG) (N/A) TRANSESOPHAGEAL ECHOCARDIOGRAM (TEE) (N/A) today. The various methods of treatment have been discussed with the patient. After consideration of the risks, benefits and treatment options the patient has consented to the planned procedure.   The patient has been seen and labs reviewed. There are no changes in the patient's condition to prevent proceeding with the planned procedure today.  Recent labs:  Lab Results  Component Value Date   WBC 7.0 06/01/2017   HGB 14.9 06/01/2017   HCT 44.0 06/01/2017   PLT 86 (L) 06/01/2017   GLUCOSE 104 (H) 06/01/2017   CHOL 130 05/31/2017   TRIG 63 05/31/2017   HDL 56 05/31/2017   LDLCALC 61 05/31/2017   ALT 14 (L) 05/31/2017   AST 16 05/31/2017   NA 137 06/01/2017   K 3.6 06/01/2017   CL 103 06/01/2017   CREATININE 1.41 (H) 06/01/2017   BUN 16 06/01/2017   CO2 22 06/01/2017   TSH 2.498 08/06/2015   INR 1.02 05/31/2017   HGBA1C 5.6 05/31/2017    Wayne Bussing, MD 06/02/2017 7:25 AM  brief episode of altered mental status this am during blood draw Neuro exam in preop area completely normal with no complaints by patient Etiology probably cardiac or stress induced Proceed with CABG for critical CAD- preop carotid dopplers show mild-moderate disease

## 2017-06-03 ENCOUNTER — Inpatient Hospital Stay (HOSPITAL_COMMUNITY): Payer: Medicare HMO

## 2017-06-03 ENCOUNTER — Encounter (HOSPITAL_COMMUNITY): Payer: Self-pay | Admitting: Cardiothoracic Surgery

## 2017-06-03 LAB — BASIC METABOLIC PANEL
Anion gap: 7 (ref 5–15)
BUN: 16 mg/dL (ref 6–20)
CALCIUM: 8 mg/dL — AB (ref 8.9–10.3)
CO2: 23 mmol/L (ref 22–32)
Chloride: 111 mmol/L (ref 101–111)
Creatinine, Ser: 1.29 mg/dL — ABNORMAL HIGH (ref 0.61–1.24)
GFR calc non Af Amer: 51 mL/min — ABNORMAL LOW (ref >60)
GFR, EST AFRICAN AMERICAN: 59 mL/min — AB (ref >60)
Glucose, Bld: 153 mg/dL — ABNORMAL HIGH (ref 65–99)
Potassium: 4.6 mmol/L (ref 3.5–5.1)
SODIUM: 141 mmol/L (ref 135–145)

## 2017-06-03 LAB — CBC
HCT: 28.6 % — ABNORMAL LOW (ref 39.0–52.0)
HEMATOCRIT: 28.9 % — AB (ref 39.0–52.0)
HEMOGLOBIN: 9.4 g/dL — AB (ref 13.0–17.0)
Hemoglobin: 9.8 g/dL — ABNORMAL LOW (ref 13.0–17.0)
MCH: 30.6 pg (ref 26.0–34.0)
MCH: 29.8 pg (ref 26.0–34.0)
MCHC: 33.9 g/dL (ref 30.0–36.0)
MCHC: 32.9 g/dL (ref 30.0–36.0)
MCV: 90.3 fL (ref 78.0–100.0)
MCV: 90.8 fL (ref 78.0–100.0)
Platelets: 94 10*3/uL — ABNORMAL LOW (ref 150–400)
Platelets: 93 10*3/uL — ABNORMAL LOW (ref 150–400)
RBC: 3.2 MIL/uL — ABNORMAL LOW (ref 4.22–5.81)
RBC: 3.15 MIL/uL — AB (ref 4.22–5.81)
RDW: 13.9 % (ref 11.5–15.5)
RDW: 14.3 % (ref 11.5–15.5)
WBC: 9.3 10*3/uL (ref 4.0–10.5)
WBC: 12.5 10*3/uL — AB (ref 4.0–10.5)

## 2017-06-03 LAB — BPAM FFP
Blood Product Expiration Date: 201904272359
ISSUE DATE / TIME: 201904221154
Unit Type and Rh: 7300

## 2017-06-03 LAB — GLUCOSE, CAPILLARY
GLUCOSE-CAPILLARY: 137 mg/dL — AB (ref 65–99)
GLUCOSE-CAPILLARY: 162 mg/dL — AB (ref 65–99)
Glucose-Capillary: 109 mg/dL — ABNORMAL HIGH (ref 65–99)
Glucose-Capillary: 150 mg/dL — ABNORMAL HIGH (ref 65–99)
Glucose-Capillary: 154 mg/dL — ABNORMAL HIGH (ref 65–99)

## 2017-06-03 LAB — PREPARE FRESH FROZEN PLASMA: Unit division: 0

## 2017-06-03 LAB — PREPARE PLATELET PHERESIS: Unit division: 0

## 2017-06-03 LAB — MAGNESIUM
Magnesium: 2.5 mg/dL — ABNORMAL HIGH (ref 1.7–2.4)
Magnesium: 2.5 mg/dL — ABNORMAL HIGH (ref 1.7–2.4)

## 2017-06-03 LAB — POCT I-STAT, CHEM 8
BUN: 21 mg/dL — AB (ref 6–20)
CALCIUM ION: 1.12 mmol/L — AB (ref 1.15–1.40)
CHLORIDE: 105 mmol/L (ref 101–111)
Creatinine, Ser: 1.4 mg/dL — ABNORMAL HIGH (ref 0.61–1.24)
GLUCOSE: 147 mg/dL — AB (ref 65–99)
HCT: 29 % — ABNORMAL LOW (ref 39.0–52.0)
Hemoglobin: 9.9 g/dL — ABNORMAL LOW (ref 13.0–17.0)
Potassium: 4.2 mmol/L (ref 3.5–5.1)
Sodium: 139 mmol/L (ref 135–145)
TCO2: 21 mmol/L — ABNORMAL LOW (ref 22–32)

## 2017-06-03 LAB — BPAM PLATELET PHERESIS
Blood Product Expiration Date: 201904222359
ISSUE DATE / TIME: 201904221124
Unit Type and Rh: 7300

## 2017-06-03 LAB — CREATININE, SERUM
CREATININE: 1.55 mg/dL — AB (ref 0.61–1.24)
GFR calc non Af Amer: 41 mL/min — ABNORMAL LOW (ref >60)
GFR, EST AFRICAN AMERICAN: 47 mL/min — AB (ref >60)

## 2017-06-03 LAB — COOXEMETRY PANEL
Carboxyhemoglobin: 0.7 % (ref 0.5–1.5)
METHEMOGLOBIN: 1.7 % — AB (ref 0.0–1.5)
O2 Saturation: 60.4 %
TOTAL HEMOGLOBIN: 9.6 g/dL — AB (ref 12.0–16.0)

## 2017-06-03 MED ORDER — INSULIN DETEMIR 100 UNIT/ML ~~LOC~~ SOLN
8.0000 [IU] | Freq: Every day | SUBCUTANEOUS | Status: DC
  Filled 2017-06-03 (×2): qty 0.08

## 2017-06-03 MED ORDER — MILRINONE LACTATE IN DEXTROSE 20-5 MG/100ML-% IV SOLN
0.1250 ug/kg/min | INTRAVENOUS | Status: DC
  Filled 2017-06-03: qty 100

## 2017-06-03 MED ORDER — FUROSEMIDE 10 MG/ML IJ SOLN
40.0000 mg | Freq: Two times a day (BID) | INTRAMUSCULAR | Status: DC
  Administered 2017-06-03 (×2): 40 mg via INTRAVENOUS
  Filled 2017-06-03 (×2): qty 4

## 2017-06-03 MED ORDER — INSULIN DETEMIR 100 UNIT/ML ~~LOC~~ SOLN: 12.0000 [IU] | Freq: Two times a day (BID) | SUBCUTANEOUS | Status: DC

## 2017-06-03 MED ORDER — SIMETHICONE 80 MG PO CHEW
80.0000 mg | CHEWABLE_TABLET | Freq: Four times a day (QID) | ORAL | Status: DC
  Administered 2017-06-03 – 2017-06-09 (×24): 80 mg via ORAL
  Filled 2017-06-03 (×23): qty 1

## 2017-06-03 MED ORDER — ORAL CARE MOUTH RINSE
15.0000 mL | Freq: Two times a day (BID) | OROMUCOSAL | Status: DC
  Administered 2017-06-03 – 2017-06-09 (×5): 15 mL via OROMUCOSAL

## 2017-06-03 MED ORDER — INSULIN ASPART 100 UNIT/ML ~~LOC~~ SOLN
0.0000 [IU] | SUBCUTANEOUS | Status: DC
  Administered 2017-06-03: 4 [IU] via SUBCUTANEOUS

## 2017-06-03 MED FILL — Magnesium Sulfate Inj 50%: INTRAMUSCULAR | Qty: 10 | Status: AC

## 2017-06-03 MED FILL — Heparin Sodium (Porcine) Inj 1000 Unit/ML: INTRAMUSCULAR | Qty: 30 | Status: AC

## 2017-06-03 MED FILL — Potassium Chloride Inj 2 mEq/ML: INTRAVENOUS | Qty: 40 | Status: AC

## 2017-06-03 NOTE — Care Management Note (Signed)
Case Management Note Donn Pierini RN, BSN Unit 4E-Case Manager- 2H coverage [phone]  Patient Details  Name: SADIEL PANAMENO MRN: [id number] Date of Birth: 1936/01/18  Subjective/Objective:  Pt admitted with [place], per cath severe CAD- s/p CABGx3 on 06/02/17                   Action/Plan: PTA Pt lived at home with spouse- CM to follow for transition of care needs- PCP- Georgann Housekeeper  Expected Discharge Date:                  Expected Discharge Plan:  Home/Self Care  In-House Referral:     Discharge planning Services  CM Consult  Post Acute Care Choice:    Choice offered to:     DME Arranged:    DME Agency:     HH Arranged:    HH Agency:     Status of Service:  In process, will continue to follow  If discussed at Long Length of Stay Meetings, dates discussed:    Additional Comments:  Darrold Span, RN 06/03/2017, 12:20 PM

## 2017-06-03 NOTE — Discharge Summary (Addendum)
Physician Discharge Summary       [address]       [name] [address]             [phone]    Patient ID: Wayne Mitchell MRN: [id number] DOB/AGE: 80-May-1937 80 y.o.  Admit date: 05/30/2017 Discharge date: 06/09/2017  Admission Diagnoses: 1. Unstable angina (HCC) 2. Coronary artery disease involving native coronary artery of native heart with unstable angina pectoris [facility])  Discharge Diagnoses:  1.  S/P CABG x 3 2. ABL anemia 3. History of essential (primary) hypertension 4. History of hypothyroidism 5. History of carotid artery disease (HCC)-left ICA 60-79% 6. History of thrombocytopenia 7. History of colon polyp   Procedure (s):  [name], MD (Primary)    Procedures   LEFT HEART CATH AND CORONARY ANGIOGRAPHY on 05/30/2017:  Conclusion   1. Critical stenosis at the ostium of the LAD, large vessel, appears to be a suitable target for grafting 2. Severe stenosis of the first OM branch of the circumflex 3. Patent RCA, unable to selectively engage, suspected codominant vessel 4. Mild segmental LV contraction abnormality with LVEF estimated at 50-55%  Plan: IV heparin, TCTS consultation for consideration of CABG   Left Anterior Descending  Ost LAD to Prox LAD lesion 99% stenosed  Ost LAD to Prox LAD lesion is 99% stenosed. The lesion is calcified. Critical stenosis at the LAD origin, extending back to the left main with no landing zone for stenting  Left Circumflex  Prox Cx lesion 50% stenosed  Prox Cx lesion is 50% stenosed.  First Obtuse Marginal Branch  Ost 1st Mrg lesion 90% stenosed  Ost 1st Mrg lesion is 90% stenosed. severe stenosis at the origin of a large first OM branch  Right Coronary Artery  The vessel is moderately calcified. The RCA is patent, but could not be selectively injected. Multiple diagnostic and guide catheters are attempted. Suspect nondominant or codominant RCA. No severe stenosis identified.  Intervention      1. Coronary artery bypass grafting x4 (left internal mammary artery to  left anterior descending, saphenous vein graft to diagonal, saphenous vein graft to circumflex marginal). 2. Endoscopic harvest of right leg greater saphenous vein by Dr. Donata Clay on 06/02/2017.   History of Presenting Illness: Patient is a 82 year old male with history of hypertension, syncope, thrombocytopenia, and hypothyroidism who has been referred for surgical consultation to discuss treatment options for management of severe multivessel coronary artery disease with unstable angina.  Patient's cardiac history dates back to 2007 when he suffered a syncopal episode.  He apparently had a workup that was felt to be negative.  In 2015 he had a second syncopal episode and was evaluated at [facility].  His third syncopal episode occurred in 2017.  He was evaluated by Dr. Katrinka Blazing at that time.  Echocardiogram revealed normal left ventricular function with mild to moderate aortic insufficiency.  At the time the patient was noted to have severe bradycardia with heart rate less than 50 bpm on beta-blocker therapy.  His beta-blocker was discontinued.  He later underwent a Holter monitor off beta-blocker therapy which revealed sinus rhythm with occasional episodes of junctional tachycardia.  There were occasional brief 3-second pauses during sleep but no evidence of heart block and no severe bradycardia.  In July 2018 the patient began to experience occasional episodes of burning substernal chest pain.  He discussed this with his primary care physician who prescribed sublingual nitroglycerin.  Nitroglycerin always relieved symptoms.  Over the past 3 months the patient has had rapid acceleration of episodes of burning substernal chest pain.  Symptoms typically occur at night and frequently waking the patient from his sleep.  They usually resolve within 5-10 minutes are always relieved by nitroglycerin.  He ran out of nitroglycerin  tablets and was seen in the office May 28, 2017 by Nada Boozer who renewed the patient's prescription for nitroglycerin and scheduled the patient for elective diagnostic cardiac catheterization.  Catheterization performed today by Dr. Excell Seltzer revealed severe multivessel coronary artery disease with critical ostial stenosis of the left anterior descending coronary artery.  Left ventricular function was mildly reduced with hypokinesis of the distal anterior wall and apex.  Ejection fraction was estimated 50-55%.  Coronary anatomy was felt to be unfavorable for percutaneous coronary intervention and cardiothoracic surgical consultation was requested.  Patient is married and lives locally in [place].  He has been relatively healthy all of his life with exception of history as documented.  He admits that he is not very active physically but overall he reports no significant physical limitations.  He does not have arthritis and he ambulates without any need for mechanical assistance.  He drives an automobile and tends to ordinary chores around the house.  He enjoys playing golf.  He reports accelerating symptoms of burning substernal chest pain beginning more than 6 months ago and accelerating considerably over the past 2-3 months.  Symptoms occur most commonly at night when he is in bed trying to sleep and have awoken him from sleep at night.  He occasionally has similar episodes of pain with activity.  With particularly severe episodes of pain the intensity gets worse and he has burning sensation radiating down both arms.  Symptoms are occasionally associated with shortness of breath.  He denies any history of prolonged episodes of chest pain and he reports that all symptoms are promptly relieved by administration of nitroglycerin.  He denies any problems with exertional shortness of breath, PND, orthopnea, or lower extremity edema.  He has not had any recent dizzy spells or syncope.  Dr. Cornelius Moras has reviewed the  indications, risks, and potential benefits of coronary artery bypass grafting with the patient in his hospital room this evening.  Alternative treatment strategies have been discussed, including the relative risks, benefits and long term prognosis associated with medical therapy, percutaneous coronary intervention, and surgical revascularization.  Expectations for his postoperative convalescence following uncomplicated coronary artery bypass grafting have been discussed.  The patient understands and accepts all potential associated risks of surgery. Because of surgeon availability, Dr. Donata Clay saw the patient and ultimately consented for his coronary artery bypass grafting surgery. Pre operative carotid duplex US showed no significant right internal carotid artery stenosis and 60-79% left ICA stenosis.    Brief Hospital Course:  The patient was extubated the evening of surgery without difficulty. He remained afebrile and hemodynamically stable. Theone Murdoch, a line, chest tubes, and foley were removed early in the post operative course. Lopressor was started and titrated accordingly. He was volume over loaded and diuresed. He had ABL anemia. He did not require a post op transfusion. He was started on Trinsicon. Last H and H was 9.3/28.0.  He was found to have a right pneumothrax on 04/24. Dr. Donata Clay placed a right chest tube. He was weaned off the insulin drip. The patient's HGA1C pre op was 5.6. The patient was felt surgically stable for transfer from the ICU to PCTU however no bed became available.  He continues to progress with cardiac rehab.  He developed orthostatic hypotension and his Lopressor was changed to low dose Coreg.  He was ambulating on room air. He has been tolerating a diet and has had a bowel movement.  Home health arrangements have been made. Epicardial pacing wires were removed on 06/09/2017. Chest tube sutures will be removed the day of discharge. The patient is felt surgically stable for  discharge today.  Latest Vital Signs: Blood pressure 120/89, pulse 83, temperature 97.8 F (36.6 C), temperature source Oral, resp. rate 17, height 5\' 7"  (1.702 m), weight 158 lb 15.2 oz (72.1 kg), SpO2 95 %.  Physical Exam:    General- alert and comfortable    Neck- no JVD, no cervical adenopathy palpable, no carotid bruit   Lungs- clear without rales, wheezes   Cor- regular rate and rhythm, no murmur , gallop   Abdomen- soft, non-tender   Extremities - warm, non-tender, minimal edema   Neuro- oriented, appropriate, no focal weakness   Discharge Condition: Stable and discharged to home  Recent laboratory studies:  Lab Results  Component Value Date   WBC 9.3 06/09/2017   HGB 9.3 (L) 06/09/2017   HCT 28.0 (L) 06/09/2017   MCV 88.9 06/09/2017   PLT 127 (L) 06/09/2017   Lab Results  Component Value Date   NA 137 06/09/2017   K 2.7 (LL) 06/09/2017   CL 96 (L) 06/09/2017   CO2 32 06/09/2017   CREATININE 1.25 (H) 06/09/2017   GLUCOSE 103 (H) 06/09/2017      Diagnostic Studies: Dg Chest 2 View  Result Date: 05/31/2017 CLINICAL DATA:  82 year old male with chest pain. EXAM: CHEST - 2 VIEW COMPARISON:  Prior chest x-ray 08/05/2015 FINDINGS: Cardiac and mediastinal contours remain within normal limits. Atherosclerotic calcifications are present in the transverse aorta. Prominent nipple shadows noted bilaterally. These are symmetric. No additional nodular opacities identified. The lungs are clear save for mild chronic bronchitic changes. No pneumothorax or pleural effusion. No acute osseous abnormality. Bilateral acromioclavicular joint osteoarthritis. IMPRESSION: 1. No acute cardiopulmonary process. 2.  Aortic Atherosclerosis (ICD10-170.0) Electronically Signed   By: Malachy Moan M.D.   On: 05/31/2017 09:41   Dg Chest Port 1 View  Result Date: 06/07/2017 CLINICAL DATA:  CABG. EXAM: PORTABLE CHEST 1 VIEW COMPARISON:  Chest x-ray from yesterday. FINDINGS: Unchanged right PICC  line. Interval removal of the right chest tube. Stable cardiomediastinal silhouette status post CABG. Normal pulmonary vascularity. Unchanged elevation of the left hemidiaphragm with left greater than right bibasilar atelectasis. No pleural effusion or pneumothorax. No acute osseous abnormality. IMPRESSION: 1. Interval removal of the right chest tube.  No pneumothorax. 2. Stable bibasilar atelectasis. Electronically Signed   By: Obie Dredge M.D.   On: 06/07/2017 08:53   Dg Chest Port 1 View  Result Date: 06/06/2017 CLINICAL DATA:  Chest tube removal EXAM: PORTABLE CHEST 1 VIEW COMPARISON:  June 05, 2017 FINDINGS: Chest tube has been removed from the left side. Chest tube remains on the right. No pneumothorax on either side. Cordis has been removed. There is now a peripherally inserted central catheter on the right with tip in superior vena cava. There is persistent elevation of the left hemidiaphragm. There is atelectatic change in the left mid lung and both bases. No consolidation evident. Heart is mildly enlarged with pulmonary vascularity within normal limits. No adenopathy. There is aortic atherosclerosis. Patient is status post coronary artery bypass grafting. There is left carotid artery calcification. There is degenerative change in  each shoulder. IMPRESSION: Tube and catheter positions as described without evident pneumothorax. Persistent elevation of the left hemidiaphragm. Bibasilar and left mid lung atelectasis. No consolidation. Stable cardiac silhouette. There is aortic atherosclerosis as well as left carotid artery calcification. Aortic Atherosclerosis (ICD10-I70.0). Electronically Signed   By: Bretta Bang III M.D.   On: 06/06/2017 09:17   Dg Chest Port 1 View  Result Date: 06/05/2017 CLINICAL DATA:  Pneumothorax.  Shortness of breath. EXAM: PORTABLE CHEST 1 VIEW COMPARISON:  06/04/2017.  06/03/2017. FINDINGS: Right IJ sheath in stable position. Bilateral chest tubes in stable  position. Prior CABG. Stable cardiomegaly. Bilateral multifocal atelectasis/infiltrates unchanged. No pleural effusion or pneumothorax. IMPRESSION: 1. Lines and tubes in stable position. Bilateral chest tubes in stable position. 2.  Bilateral multifocal atelectasis/infiltrates unchanged. Electronically Signed   By: Maisie Fus  Register   On: 06/05/2017 10:00   Dg Chest Port 1 View  Result Date: 06/04/2017 CLINICAL DATA:  Chest tube insertion for pneumothorax EXAM: PORTABLE CHEST 1 VIEW COMPARISON:  Study obtained earlier in the day FINDINGS: There is a new right-sided chest tube with the tip directed inferiorly on the right. There is a small residual pneumothorax in the right apex region without pneumothorax. The remainder of the pneumothorax has resolved. There is a persistent chest tube on the left without pneumothorax on the left. Temporary pacemaker wires are attached to the right heart. Cordis tip is in the superior vena cava. There is bilateral atelectasis in the lower lung zones. There is consolidation in the medial left base. Heart size and pulmonary vascularity are normal. No adenopathy. No evident bone lesions. IMPRESSION: Chest tube placed on the right with small residual right apical pneumothorax. No tension component. Tubes and catheters elsewhere are unchanged. Consolidation medial left base. Bilateral lower lung zone atelectasis present. Stable cardiac silhouette. Electronically Signed   By: Bretta Bang III M.D.   On: 06/04/2017 09:49   Dg Chest Port 1 View  Addendum Date: 06/04/2017   ADDENDUM REPORT: 06/04/2017 06:27 ADDENDUM: Acute findings discussed with and reconfirmed by Leonette Most RN on 06/04/2017 at 6:27 am. Electronically Signed   By: Awilda Metro M.D.   On: 06/04/2017 06:27   Result Date: 06/04/2017 CLINICAL DATA:  Hypoxia. EXAM: PORTABLE CHEST 1 VIEW COMPARISON:  Chest radiograph June 03, 2017 FINDINGS: New moderate RIGHT pneumothorax. Interval removal of Swan-Ganz catheter,  RIGHT internal jugular sheath remains in place. LEFT chest tube and mediastinal drain in place. Persistently elevated LEFT hemidiaphragm with bibasilar strandy densities. Mild cardiomegaly. Status post median sternotomy for CABG. Calcified aortic arch. Soft tissue planes and included osseous structures are unchanged. IMPRESSION: 1. New moderate RIGHT pneumothorax. Interval removal of Swan-Ganz catheter, sheath remains. 2. Stable chest tubes. 3. Stable cardiomegaly and bibasilar atelectasis/scarring. Electronically Signed: By: Awilda Metro M.D. On: 06/04/2017 06:20   Dg Chest Port 1 View  Result Date: 06/03/2017 CLINICAL DATA:  Post CABG, chest tube EXAM: PORTABLE CHEST 1 VIEW COMPARISON:  06/02/2017 FINDINGS: Interval extubation and removal of NG tube. Swan-Ganz catheter has been advanced into the central right lower lobe pulmonary artery. Left chest tube remains in place. No pneumothorax. Mild cardiomegaly, vascular congestion and bibasilar atelectasis. IMPRESSION: Vascular congestion, bibasilar atelectasis. No pneumothorax. Electronically Signed   By: Charlett Nose M.D.   On: 06/03/2017 08:54   Dg Chest Port 1 View  Result Date: 06/02/2017 CLINICAL DATA:  Status post CABG EXAM: PORTABLE CHEST 1 VIEW COMPARISON:  Preoperative chest x-ray of May 31, 2017 FINDINGS: The lungs are reasonably well  expanded. There is subsegmental atelectasis in the left perihilar region. There is no pneumothorax or pleural effusion. The heart is top-normal in size. The central pulmonary vascularity is mildly prominent. The endotracheal tube tip projects approximately 5 cm above the carina and 2 cm below the inferior margin of the clavicular heads. The esophagogastric tubes proximal port lies at or just above the GE junction. The Swan-Ganz catheter tip projects in projects in the main pulmonary artery. The mediastinal drain and left chest tube are in reasonable positions. The bowel gas pattern in the upper abdomen are is  normal. IMPRESSION: Minimal central pulmonary vascular congestion. Mild left perihilar subsegmental atelectasis. No pneumothorax or significant pleural effusion. Advancement of the esophagogastric tube by 5-10 cm is recommended to assure that the proximal port lies below the GE junction. Reasonable positioning of the other support tubes. Electronically Signed   By: David  [place] M.D.   On: 06/02/2017 14:25   [place] Ekg Site Rite  Result Date: 06/05/2017 If Site Rite image not attached, placement could not be confirmed due to current cardiac rhythm.  Discharge Medications: Allergies as of 06/09/2017      Reactions   Lisinopril Cough   Cough      Medication List    STOP taking these medications   amLODipine 10 MG tablet Commonly known as:  NORVASC   hydrochlorothiazide 12.5 MG tablet Commonly known as:  HYDRODIURIL   irbesartan 300 MG tablet Commonly known as:  AVAPRO   nitroGLYCERIN 0.4 MG SL tablet Commonly known as:  NITROSTAT     TAKE these medications   acetaminophen 325 MG tablet Commonly known as:  TYLENOL Take 2 tablets (650 mg total) by mouth every 6 (six) hours as needed for mild pain.   aspirin EC 81 MG tablet Take 81 mg by mouth See admin instructions. Twice monthly   atorvastatin 20 MG tablet Commonly known as:  LIPITOR Take 20 mg by mouth daily.   carvedilol 3.125 MG tablet Commonly known as:  COREG Take 1 tablet (3.125 mg total) by mouth daily.   furosemide 40 MG tablet Commonly known as:  LASIX Take 1 tablet (40 mg total) by mouth daily for 7 days. Start taking on:  06/10/2017   levothyroxine 112 MCG tablet Commonly known as:  SYNTHROID, LEVOTHROID Take 112 mcg by mouth daily before breakfast.   pantoprazole 20 MG tablet Commonly known as:  PROTONIX Take 20 mg by mouth 2 (two) times daily.   traMADol 50 MG tablet Commonly known as:  ULTRAM Take 1 tablet (50 mg total) by mouth every 4 (four) hours as needed for moderate pain.     The patient has  been discharged on:   1.Beta Blocker:  Yes [ x  ]                              No   [   ]                              If No, reason:  2.Ace Inhibitor/ARB: Yes [   ]                                     No  [  x  ]  If No, reason: Orthostatic Hypotension  3.Statin:   Yes [ x  ]                  No  [   ]                  If No, reason:  4.Ecasa:  Yes  [  x ]                  No   [   ]                  If No, reason:  Follow Up Appointments: Follow-up Information    Berton Bon, NP. Go on 06/19/2017.   Specialty:  Nurse Practitioner Why:  Appointment time is at 8:00 am  Contact information: [address] [place] [address] [phone]        Kerin Perna, MD. Go on 07/14/2017.   Specialty:  Cardiothoracic Surgery Why:  Appointment is with the physician assistant. PA/LAT CXR to be taken (at [facility] which is in the same building as Dr. Zenaida Niece Trigt's office, on the ground floor) on 07/14/2017 at 12:30 pm;Appointment time is at 1:00 pm Contact information: [address] [place] [address] [phone]           Signed: Carl Best 06/09/2017,   patient examined and medical record reviewed,agree with above note. Kathlee Nations Trigt III 06/09/2017

## 2017-06-03 NOTE — Discharge Instructions (Signed)
Coronary Artery Bypass Grafting, Care After °These instructions give you information on caring for yourself after your procedure. Your doctor may also give you more specific instructions. Call your doctor if you have any problems or questions after your procedure. °Follow these instructions at home: °· Only take medicine as told by your doctor. Take medicines exactly as told. Do not stop taking medicines or start any new medicines without talking to your doctor first. °· Take your pulse as told by your doctor. °· Do deep breathing as told by your doctor. Use your breathing device (incentive spirometer), if given, to practice deep breathing several times a day. Support your chest with a pillow or your arms when you take deep breaths or cough. °· Keep the area clean, dry, and protected where the surgery cuts (incisions) were made. Remove bandages (dressings) only as told by your doctor. If strips were applied to surgical area, do not take them off. They fall off on their own. °· Check the surgery area daily for puffiness (swelling), redness, or leaking fluid. °· If surgery cuts were made in your legs: °? Avoid crossing your legs. °? Avoid sitting for long periods of time. Change positions every 30 minutes. °? Raise your legs when you are sitting. Place them on pillows. °· Wear stockings that help keep blood clots from forming in your legs (compression stockings). °· Only take sponge baths until your doctor says it is okay to take showers. Pat the surgery area dry. Do not rub the surgery area with a washcloth or towel. Do not bathe, swim, or use a hot tub until your doctor says it is okay. °· Eat foods that are high in fiber. These include raw fruits and vegetables, whole grains, beans, and nuts. Choose lean meats. Avoid canned, processed, and fried foods. °· Drink enough fluids to keep your pee (urine) clear or pale yellow. °· Weigh yourself every day. °· Rest and limit activity as told by your doctor. You may be told  to: °? Stop any activity if you have chest pain, shortness of breath, changes in heartbeat, or dizziness. Get help right away if this happens. °? Move around often for short amounts of time or take short walks as told by your doctor. Gradually become more active. You may need help to strengthen your muscles and build endurance. °? Avoid lifting, pushing, or pulling anything heavier than 10 pounds (4.5 kg) for at least 6 weeks after surgery. °· Do not drive until your doctor says it is okay. °· Ask your doctor when you can go back to work. °· Ask your doctor when you can begin sexual activity again. °· Follow up with your doctor as told. °Contact a doctor if: °· You have puffiness, redness, more pain, or fluid draining from the incision site. °· You have a fever. °· You have puffiness in your ankles or legs. °· You have pain in your legs. °· You gain 2 or more pounds (0.9 kg) a day. °· You feel sick to your stomach (nauseous) or throw up (vomit). °· You have watery poop (diarrhea). °Get help right away if: °· You have chest pain that goes to your jaw or arms. °· You have shortness of breath. °· You have a fast or irregular heartbeat. °· You notice a "clicking" in your breastbone when you move. °· You have numbness or weakness in your arms or legs. °· You feel dizzy or light-headed. °This information is not intended to replace advice given to you by   your health care provider. Make sure you discuss any questions you have with your health care provider. °Document Released: 02/02/2013 Document Revised: 07/06/2015 Document Reviewed: 07/07/2012 °Elsevier Interactive Patient Education © 2017 [employer] ° °

## 2017-06-03 NOTE — Progress Notes (Addendum)
TCTS DAILY ICU PROGRESS NOTE                   [address]            [name] [address]          [phone]   1 Day Post-Op Procedure(s) (LRB): CORONARY ARTERY BYPASS GRAFTING (CABG) x3 Using Left Internal Mammary Artery and Right Leg Greater Saphenous Vein harvested endoscopically (N/A) TRANSESOPHAGEAL ECHOCARDIOGRAM (TEE) (N/A)  Total Length of Stay:  LOS: 4 days   Subjective: Patient awake and alert this am.  Objective: Vital signs in last 24 hours: Temp:  [96.3 F (35.7 C)-99.3 F (37.4 C)] 99 F (37.2 C) (04/23 0600) Pulse Rate:  [69-88] 80 (04/23 0600) Cardiac Rhythm: Atrial paced;Normal sinus rhythm (04/23 0400) Resp:  [11-27] 21 (04/23 0600) BP: (56-123)/(39-84) 123/62 (04/23 0400) SpO2:  [97 %-100 %] 98 % (04/23 0600) Arterial Line BP: (98-148)/(35-50) 148/44 (04/23 0600) FiO2 (%):  [36 %-50 %] 36 % (04/22 1821) Weight:  [173 lb 8 oz (78.7 kg)] 173 lb 8 oz (78.7 kg) (04/23 0600)  Filed Weights   06/01/17 0558 06/02/17 0516 06/03/17 0600  Weight: 156 lb 3.2 oz (70.9 kg) 155 lb 6.4 oz (70.5 kg) 173 lb 8 oz (78.7 kg)    Weight change: 18 lb 1.6 oz (8.211 kg)   Hemodynamic parameters for last 24 hours: PAP: (20-40)/(7-25) 23/16 CO:  [4 L/min-4.3 L/min] 4.3 L/min CI:  [2.2 L/min/m2-2.4 L/min/m2] 2.4 L/min/m2  Intake/Output from previous day: 04/22 0701 - 04/23 0700 In: 6605.3 [P.O.:220; I.V.:4403.3; Blood:682; IV Piggyback:1300] Out: 2635 [Urine:1505; Blood:500; Chest Tube:630]  Intake/Output this shift: No intake/output data recorded.  Current Meds: Scheduled Meds: . acetaminophen  1,000 mg Oral Q6H   Or  . acetaminophen (TYLENOL) oral liquid 160 mg/5 mL  1,000 mg Per Tube Q6H  . aspirin EC  325 mg Oral Daily   Or  . aspirin  324 mg Per Tube Daily  . atorvastatin  20 mg Oral Daily  . bisacodyl  10 mg Oral Daily   Or  . bisacodyl  10 mg Rectal Daily  . Chlorhexidine Gluconate Cloth  6 each Topical Daily  . docusate sodium  200  mg Oral Daily  . levothyroxine  112 mcg Oral QAC breakfast  . metoCLOPramide (REGLAN) injection  10 mg Intravenous Q6H  . metoprolol tartrate  12.5 mg Oral BID   Or  . metoprolol tartrate  12.5 mg Per Tube BID  . [START ON 06/04/2017] pantoprazole  40 mg Oral Daily  . sodium chloride flush  10-40 mL Intracatheter Q12H  . sodium chloride flush  3 mL Intravenous Q12H   Continuous Infusions: . sodium chloride 20 mL/hr at 06/03/17 0600  . sodium chloride    . sodium chloride 20 mL/hr at 06/03/17 0600  . albumin human    . cefUROXime (ZINACEF)  IV Stopped (06/02/17 2244)  . dexmedetomidine (PRECEDEX) IV infusion 0 mcg/kg/hr (06/02/17 1530)  . lactated ringers    . lactated ringers 20 mL/hr at 06/03/17 0600  . lactated ringers 20 mL/hr at 06/03/17 0600  . milrinone 0.25 mcg/kg/min (06/03/17 0600)  . nitroGLYCERIN 0 mcg/min (06/02/17 1322)  . phenylephrine (NEO-SYNEPHRINE) Adult infusion 50 mcg/min (06/03/17 0600)   PRN Meds:.sodium chloride, albumin human, lactated ringers, metoprolol tartrate, midazolam, morphine injection, ondansetron (ZOFRAN) IV, oxyCODONE, sodium chloride flush, sodium chloride flush, traMADol  General appearance: alert, cooperative and no distress Neurologic: intact Heart: RRR Lungs: Diminished at bases  Abdomen: Soft, non tender, sporadic bowel sounds Extremities: SCD on left lower extremity;RLE with mild LE edema Wound: Aquacel intact;RLE wounds are clean and dry  Lab Results: CBC: Recent Labs    06/02/17 2000 06/03/17 0615  WBC 10.2 9.3  HGB 9.7* 9.8*  HCT 28.8* 28.9*  PLT 98* 94*   BMET:  Recent Labs    06/01/17 0732  06/02/17 1935 06/02/17 2000 06/03/17 0615  NA 137   < > 142  --  141  K 3.6   < > 3.7  --  4.6  CL 103  --  106  --  111  CO2 22  --   --   --  23  GLUCOSE 104*   < > 137*  --  153*  BUN 16  --  14  --  16  CREATININE 1.41*  --  1.10 1.29* 1.29*  CALCIUM 9.2  --   --   --  8.0*   < > = values in this interval not displayed.      CMET: Lab Results  Component Value Date   WBC 9.3 06/03/2017   HGB 9.8 (L) 06/03/2017   HCT 28.9 (L) 06/03/2017   PLT 94 (L) 06/03/2017   GLUCOSE 153 (H) 06/03/2017   CHOL 130 05/31/2017   TRIG 63 05/31/2017   HDL 56 05/31/2017   LDLCALC 61 05/31/2017   ALT 14 (L) 05/31/2017   AST 16 05/31/2017   NA 141 06/03/2017   K 4.6 06/03/2017   CL 111 06/03/2017   CREATININE 1.29 (H) 06/03/2017   BUN 16 06/03/2017   CO2 23 06/03/2017   TSH 2.498 08/06/2015   INR 1.42 06/02/2017   HGBA1C 5.6 05/31/2017    PT/INR:  Recent Labs    06/02/17 1318  LABPROT 17.2*  INR 1.42   Radiology: Dg Chest Port 1 View  Result Date: 06/02/2017 CLINICAL DATA:  Status post CABG EXAM: PORTABLE CHEST 1 VIEW COMPARISON:  Preoperative chest x-ray of May 31, 2017 FINDINGS: The lungs are reasonably well expanded. There is subsegmental atelectasis in the left perihilar region. There is no pneumothorax or pleural effusion. The heart is top-normal in size. The central pulmonary vascularity is mildly prominent. The endotracheal tube tip projects approximately 5 cm above the carina and 2 cm below the inferior margin of the clavicular heads. The esophagogastric tubes proximal port lies at or just above the GE junction. The Swan-Ganz catheter tip projects in projects in the main pulmonary artery. The mediastinal drain and left chest tube are in reasonable positions. The bowel gas pattern in the upper abdomen are is normal. IMPRESSION: Minimal central pulmonary vascular congestion. Mild left perihilar subsegmental atelectasis. No pneumothorax or significant pleural effusion. Advancement of the esophagogastric tube by 5-10 cm is recommended to assure that the proximal port lies below the GE junction. Reasonable positioning of the other support tubes. Electronically Signed   By: David  [place] M.D.   On: 06/02/2017 14:25     Assessment/Plan: S/P Procedure(s) (LRB): CORONARY ARTERY BYPASS GRAFTING (CABG) x3 Using Left  Internal Mammary Artery and Right Leg Greater Saphenous Vein harvested endoscopically (N/A) TRANSESOPHAGEAL ECHOCARDIOGRAM (TEE) (N/A)  1. CV-CO/CI 3.6/2. On Milrinone and Neo Synephrine drips. Co ox 60.4-re check in am.  2. Pulmonary-On 4 liters of oxygen via [place]. CXR this am appears to show patient rotated to the right, bibasilar atelectasis and pulmonary vascular congestion. Encourage incentive spirometer. 3. Volume overload-will defer to Dr. Donata Clay as on Neo Synephrine. 4. ABL  anemia-H and H stable at 9.8 and 28.9 5. Creatinine remains 1.29, which is decreased from pre op 1.41. 6. CBGs 137/154/150. On Insulin drip.  7. Hypothyroidism-on Levothyroxine 112 mcg. Will check TSH in am 8. Thrombocytopenia-platelets 94,000 9. Please see progression orders  Donielle Margaretann Loveless PA-C 06/03/2017 7:58 AM   Mobilize to chait diueresis Leave chest tubes patient examined and medical record reviewed,agree with above note. Kathlee Nations Trigt III 06/03/2017

## 2017-06-04 ENCOUNTER — Inpatient Hospital Stay (HOSPITAL_COMMUNITY): Payer: Medicare HMO

## 2017-06-04 LAB — CBC
HCT: 26.9 % — ABNORMAL LOW (ref 39.0–52.0)
Hemoglobin: 8.9 g/dL — ABNORMAL LOW (ref 13.0–17.0)
MCH: 30.3 pg (ref 26.0–34.0)
MCHC: 33.1 g/dL (ref 30.0–36.0)
MCV: 91.5 fL (ref 78.0–100.0)
Platelets: 74 10*3/uL — ABNORMAL LOW (ref 150–400)
RBC: 2.94 MIL/uL — ABNORMAL LOW (ref 4.22–5.81)
RDW: 14.2 % (ref 11.5–15.5)
WBC: 11 10*3/uL — ABNORMAL HIGH (ref 4.0–10.5)

## 2017-06-04 LAB — GLUCOSE, CAPILLARY
GLUCOSE-CAPILLARY: 116 mg/dL — AB (ref 65–99)
Glucose-Capillary: 111 mg/dL — ABNORMAL HIGH (ref 65–99)
Glucose-Capillary: 143 mg/dL — ABNORMAL HIGH (ref 65–99)
Glucose-Capillary: 136 mg/dL — ABNORMAL HIGH (ref 65–99)
Glucose-Capillary: 118 mg/dL — ABNORMAL HIGH (ref 65–99)

## 2017-06-04 LAB — BASIC METABOLIC PANEL
Anion gap: 8 (ref 5–15)
BUN: 22 mg/dL — ABNORMAL HIGH (ref 6–20)
CO2: 24 mmol/L (ref 22–32)
Calcium: 8.1 mg/dL — ABNORMAL LOW (ref 8.9–10.3)
Chloride: 105 mmol/L (ref 101–111)
Creatinine, Ser: 1.55 mg/dL — ABNORMAL HIGH (ref 0.61–1.24)
GFR calc Af Amer: 47 mL/min — ABNORMAL LOW (ref >60)
GFR calc non Af Amer: 41 mL/min — ABNORMAL LOW (ref >60)
Glucose, Bld: 123 mg/dL — ABNORMAL HIGH (ref 65–99)
Potassium: 4.2 mmol/L (ref 3.5–5.1)
Sodium: 137 mmol/L (ref 135–145)

## 2017-06-04 LAB — BPAM RBC
BLOOD PRODUCT EXPIRATION DATE: 201905162359
Blood Product Expiration Date: 201905162359
UNIT TYPE AND RH: 7300
Unit Type and Rh: 7300

## 2017-06-04 LAB — TYPE AND SCREEN
ABO/RH(D): B POS
ANTIBODY SCREEN: NEGATIVE
UNIT DIVISION: 0
Unit division: 0

## 2017-06-04 LAB — COOXEMETRY PANEL
CARBOXYHEMOGLOBIN: 1 % (ref 0.5–1.5)
Methemoglobin: 1 % (ref 0.0–1.5)
O2 Saturation: 46.1 %
Total hemoglobin: 9 g/dL — ABNORMAL LOW (ref 12.0–16.0)

## 2017-06-04 LAB — TSH: TSH: 5.209 u[IU]/mL — ABNORMAL HIGH (ref 0.350–4.500)

## 2017-06-04 MED ORDER — FUROSEMIDE 10 MG/ML IJ SOLN
40.0000 mg | Freq: Every day | INTRAMUSCULAR | Status: DC
  Administered 2017-06-04: 40 mg via INTRAVENOUS
  Filled 2017-06-04 (×2): qty 4

## 2017-06-04 MED ORDER — INSULIN ASPART 100 UNIT/ML ~~LOC~~ SOLN: 0.0000 [IU] | Freq: Every day | SUBCUTANEOUS | Status: DC

## 2017-06-04 MED ORDER — LIDOCAINE HCL (PF) 1 % IJ SOLN
INTRAMUSCULAR | Status: AC
  Administered 2017-06-04: 09:00:00
  Filled 2017-06-04: qty 30

## 2017-06-04 MED ORDER — INSULIN ASPART 100 UNIT/ML ~~LOC~~ SOLN
0.0000 [IU] | Freq: Three times a day (TID) | SUBCUTANEOUS | Status: DC
  Administered 2017-06-04 – 2017-06-05 (×3): 2 [IU] via SUBCUTANEOUS

## 2017-06-04 MED ORDER — OXYCODONE HCL 5 MG PO TABS
5.0000 mg | ORAL_TABLET | ORAL | Status: DC | PRN
  Administered 2017-06-04 (×2): 5 mg via ORAL
  Filled 2017-06-04 (×4): qty 1

## 2017-06-04 MED ORDER — MORPHINE SULFATE (PF) 2 MG/ML IV SOLN
2.0000 mg | INTRAVENOUS | Status: DC | PRN
  Administered 2017-06-04: 2 mg via INTRAVENOUS
  Filled 2017-06-04: qty 1

## 2017-06-04 MED ORDER — FE FUMARATE-B12-VIT C-FA-IFC PO CAPS
1.0000 | ORAL_CAPSULE | Freq: Every day | ORAL | Status: DC
  Administered 2017-06-04 – 2017-06-09 (×5): 1 via ORAL
  Filled 2017-06-04 (×5): qty 1

## 2017-06-04 MED FILL — Mannitol IV Soln 20%: INTRAVENOUS | Qty: 500 | Status: AC

## 2017-06-04 MED FILL — Sodium Bicarbonate IV Soln 8.4%: INTRAVENOUS | Qty: 50 | Status: AC

## 2017-06-04 MED FILL — Sodium Chloride IV Soln 0.9%: INTRAVENOUS | Qty: 2000 | Status: AC

## 2017-06-04 MED FILL — Heparin Sodium (Porcine) Inj 1000 Unit/ML: INTRAMUSCULAR | Qty: 10 | Status: AC

## 2017-06-04 MED FILL — Lidocaine HCl(Cardiac) IV PF Soln Pref Syr 100 MG/5ML (2%): INTRAVENOUS | Qty: 5 | Status: AC

## 2017-06-04 MED FILL — Electrolyte-R (PH 7.4) Solution: INTRAVENOUS | Qty: 4000 | Status: AC

## 2017-06-04 NOTE — Progress Notes (Signed)
Urinary catheter discontinued at 1030. Patient unable to void spontaneously. Bladder scan conducted at 1800 that revealed 556 cc of urine. In and Out catherization performed and 425 cc of amber urine removed. Will continue to monitor. Will notify oncoming RN.

## 2017-06-04 NOTE — Progress Notes (Signed)
CT surgery team rounds  Patient with improved pulmonary status after placement of right chest tube for moderate right pneumothorax Ambulated in hallway 1 lab Saturation 95% on 6 L Maintaining sinus rhythm

## 2017-06-04 NOTE — Anesthesia Postprocedure Evaluation (Signed)
Anesthesia Post Note  Patient: Wayne Mitchell  Procedure(s) Performed: CORONARY ARTERY BYPASS GRAFTING (CABG) x3 Using Left Internal Mammary Artery and Right Leg Greater Saphenous Vein harvested endoscopically (N/A Chest) TRANSESOPHAGEAL ECHOCARDIOGRAM (TEE) (N/A )     Patient location during evaluation: SICU Anesthesia Type: General Level of consciousness: awake and alert, oriented and patient cooperative Pain management: pain level controlled Vital Signs Assessment: post-procedure vital signs reviewed and stable Respiratory status: spontaneous breathing and patient connected to nasal cannula oxygen (pt with new PTX, Dr. Maren Beach to re-insert chest tube) Cardiovascular status: stable (weaning inotropes and pressors) Postop Assessment: no apparent nausea or vomiting and adequate PO intake Anesthetic complications: no    Last Vitals:  Vitals:   06/04/17 0700 06/04/17 0718  BP:  126/67  Pulse: 73 74  Resp: (!) 22 18  Temp:    SpO2: 95% 94%    Last Pain:  Vitals:   06/04/17 0400  TempSrc: Oral  PainSc: Asleep                 Drayden Lukas,E. Murl Golladay

## 2017-06-04 NOTE — Progress Notes (Signed)
Pt began having low sats on monitor when getting up to chair, requiring more O2, placed on high flow nasal canula, sats now in 90s. Morning chest Xray obtained.  [facility] radiology called this RN to inform pt has new right sided pneumothorax. Paged Dr. Donata Clay, received verbal orders to prepare chest tube tray and obtain consent. Will carry out and continue to monitor.  Oncoming shift notified of these events.  Herma Ard, RN

## 2017-06-04 NOTE — Progress Notes (Addendum)
TCTS DAILY ICU PROGRESS NOTE                   [address]            [name] [address]          [phone]   2 Days Post-Op Procedure(s) (LRB): CORONARY ARTERY BYPASS GRAFTING (CABG) x3 Using Left Internal Mammary Artery and Right Leg Greater Saphenous Vein harvested endoscopically (N/A) TRANSESOPHAGEAL ECHOCARDIOGRAM (TEE) (N/A)  Total Length of Stay:  LOS: 5 days   Subjective: Patient awake and alert this am. He states it is a little hard to breathe this am.  Objective: Vital signs in last 24 hours: Temp:  [97.8 F (36.6 C)-98.8 F (37.1 C)] 98.7 F (37.1 C) (04/24 0400) Pulse Rate:  [60-81] 74 (04/24 0718) Cardiac Rhythm: Normal sinus rhythm (04/24 0400) Resp:  [11-30] 18 (04/24 0718) BP: (110-140)/(45-107) 126/67 (04/24 0718) SpO2:  [89 %-100 %] 94 % (04/24 0718) Arterial Line BP: (90-164)/(35-50) 164/45 (04/23 1700) Weight:  [175 lb 0.7 oz (79.4 kg)] 175 lb 0.7 oz (79.4 kg) (04/24 0500)  Filed Weights   06/02/17 0516 06/03/17 0600 06/04/17 0500  Weight: 155 lb 6.4 oz (70.5 kg) 173 lb 8 oz (78.7 kg) 175 lb 0.7 oz (79.4 kg)    Weight change: 1 lb 8.7 oz (0.7 kg)   Hemodynamic parameters for last 24 hours: PAP: (16-37)/(11-28) 24/16  Intake/Output from previous day: 04/23 0701 - 04/24 0700 In: 2280.9 [P.O.:1200; I.V.:780.9; IV Piggyback:300] Out: 1190 [Urine:850; Chest Tube:340]  Intake/Output this shift: No intake/output data recorded.  Current Meds: Scheduled Meds: . lidocaine (PF)      . acetaminophen  1,000 mg Oral Q6H   Or  . acetaminophen (TYLENOL) oral liquid 160 mg/5 mL  1,000 mg Per Tube Q6H  . aspirin EC  325 mg Oral Daily   Or  . aspirin  324 mg Per Tube Daily  . atorvastatin  20 mg Oral Daily  . bisacodyl  10 mg Oral Daily   Or  . bisacodyl  10 mg Rectal Daily  . Chlorhexidine Gluconate Cloth  6 each Topical Daily  . docusate sodium  200 mg Oral Daily  . furosemide  40 mg Intravenous BID  . insulin aspart  0-24 Units  Subcutaneous Q4H  . insulin detemir  8 Units Subcutaneous Daily  . levothyroxine  112 mcg Oral QAC breakfast  . mouth rinse  15 mL Mouth Rinse BID  . metoCLOPramide (REGLAN) injection  10 mg Intravenous Q6H  . metoprolol tartrate  12.5 mg Oral BID   Or  . metoprolol tartrate  12.5 mg Per Tube BID  . pantoprazole  40 mg Oral Daily  . simethicone  80 mg Oral QID  . sodium chloride flush  10-40 mL Intracatheter Q12H  . sodium chloride flush  3 mL Intravenous Q12H   Continuous Infusions: . sodium chloride 20 mL/hr at 06/03/17 0800  . sodium chloride    . sodium chloride 20 mL/hr at 06/03/17 1000  . cefUROXime (ZINACEF)  IV Stopped (06/03/17 2300)  . lactated ringers 20 mL/hr at 06/04/17 0700  . lactated ringers 20 mL/hr at 06/03/17 0600  . milrinone 0.125 mcg/kg/min (06/04/17 0700)  . nitroGLYCERIN 0 mcg/min (06/02/17 1322)  . phenylephrine (NEO-SYNEPHRINE) Adult infusion Stopped (06/03/17 1800)   PRN Meds:.sodium chloride, metoprolol tartrate, midazolam, morphine injection, ondansetron (ZOFRAN) IV, oxyCODONE, sodium chloride flush, sodium chloride flush, traMADol  Neurologic: intact Heart: RRR Lungs: Diminished on right and  mostly clear on the left Abdomen: Soft, non tender, sporadic bowel sounds Extremities: SCD on left lower extremity;RLE with mild LE edema Wound: Aquacel intact;RLE wounds are clean and dry  Lab Results: CBC: Recent Labs    06/03/17 1700 06/03/17 1712 06/04/17 0336  WBC 12.5*  --  11.0*  HGB 9.4* 9.9* 8.9*  HCT 28.6* 29.0* 26.9*  PLT 93*  --  74*   BMET:  Recent Labs    06/03/17 0615  06/03/17 1712 06/04/17 0336  NA 141  --  139 137  K 4.6  --  4.2 4.2  CL 111  --  105 105  CO2 23  --   --  24  GLUCOSE 153*  --  147* 123*  BUN 16  --  21* 22*  CREATININE 1.29*   < > 1.40* 1.55*  CALCIUM 8.0*  --   --  8.1*   < > = values in this interval not displayed.    CMET: Lab Results  Component Value Date   WBC 11.0 (H) 06/04/2017   HGB 8.9 (L)  06/04/2017   HCT 26.9 (L) 06/04/2017   PLT 74 (L) 06/04/2017   GLUCOSE 123 (H) 06/04/2017   CHOL 130 05/31/2017   TRIG 63 05/31/2017   HDL 56 05/31/2017   LDLCALC 61 05/31/2017   ALT 14 (L) 05/31/2017   AST 16 05/31/2017   NA 137 06/04/2017   K 4.2 06/04/2017   CL 105 06/04/2017   CREATININE 1.55 (H) 06/04/2017   BUN 22 (H) 06/04/2017   CO2 24 06/04/2017   TSH 5.209 (H) 06/04/2017   INR 1.42 06/02/2017   HGBA1C 5.6 05/31/2017    PT/INR:  Recent Labs    06/02/17 1318  LABPROT 17.2*  INR 1.42   Radiology: Dg Chest Port 1 View  Addendum Date: 06/04/2017   ADDENDUM REPORT: 06/04/2017 06:27 ADDENDUM: Acute findings discussed with and reconfirmed by Leonette Most RN on 06/04/2017 at 6:27 am. Electronically Signed   By: Awilda Metro M.D.   On: 06/04/2017 06:27   Result Date: 06/04/2017 CLINICAL DATA:  Hypoxia. EXAM: PORTABLE CHEST 1 VIEW COMPARISON:  Chest radiograph June 03, 2017 FINDINGS: New moderate RIGHT pneumothorax. Interval removal of Swan-Ganz catheter, RIGHT internal jugular sheath remains in place. LEFT chest tube and mediastinal drain in place. Persistently elevated LEFT hemidiaphragm with bibasilar strandy densities. Mild cardiomegaly. Status post median sternotomy for CABG. Calcified aortic arch. Soft tissue planes and included osseous structures are unchanged. IMPRESSION: 1. New moderate RIGHT pneumothorax. Interval removal of Swan-Ganz catheter, sheath remains. 2. Stable chest tubes. 3. Stable cardiomegaly and bibasilar atelectasis/scarring. Electronically Signed: By: Awilda Metro M.D. On: 06/04/2017 06:20     Assessment/Plan: S/P Procedure(s) (LRB): CORONARY ARTERY BYPASS GRAFTING (CABG) x3 Using Left Internal Mammary Artery and Right Leg Greater Saphenous Vein harvested endoscopically (N/A) TRANSESOPHAGEAL ECHOCARDIOGRAM (TEE) (N/A)  1. CV-On Milrinone  drips. Co ox decreased to 46.1 this am. May need to increase Milrinone drip. Re check co ox in am.  2.  Pulmonary-Chest tubes with 340 of output last 24 hours. On HFNC this am. CXR this am shows new right pneumothorax, bibasilar atelectasis, and cardiomegaly. Dr. Donata Clay to place right chest tube this am. Encourage incentive spirometer. 3. Volume overload-on Lasix 40 mg IV bid. Will give once today as creatinine increased 4. ABL anemia-H and H stable at 8.9 and 26.9. Will start Trinsicon. 5. Creatinine increased to slightly to 1.55 this am. 6. CBGs 162/116/111. On low dose scheduled Insulin. Pre  op HGA1C 5.6-no prior history of diabetes. Will stop SS and accu checks upon transfer to floor. 7. Hypothyroidism-on Levothyroxine 112 mcg. TSH slightly elevated 5.209 8. Thrombocytopenia-platelets decreased to 74,000. Not on Heparin or Lovenox.He does have a history of thrombocytopenia. Re check in am.   Ardelle Balls PA-C 06/04/2017 7:52 AM   Patient developed significant right pneumothorax and chest tube placed with exit of air from the right pleural space.  Chest x-ray pending.  Patient's mixed venous saturation decrease this a.m. probably related to pneumothorax.  We will proceed with weaning off milrinone.  Leave central line today and check coags in a.m. Blood pressure improved-stop neo-drip. Remove mediastinal tube today and keep in ICU for another day of careful monitoring with rising creatinine history of preoperative MI and postoperative pneumothorax.  patient examined and medical record reviewed,agree with above note. Kathlee Nations Trigt III 06/04/2017

## 2017-06-04 NOTE — Op Note (Signed)
Wayne Mitchell, Wayne Mitchell                ACCOUNT NO.:  [id number]  MEDICAL RECORD NO.:  [id number]  LOCATION:  3E19C                        FACILITY:  [facility]  PHYSICIAN:  Kerin Perna, M.D.  DATE OF BIRTH:  October 08, 1935  DATE OF PROCEDURE:  06/04/2017 DATE OF DISCHARGE:                              OPERATIVE REPORT   OPERATION:  Placement of  right chest tube.  PREOPERATIVE DIAGNOSIS:  Postoperative right pneumothorax following CABG.  POSTOPERATIVE DIAGNOSIS:  Postoperative right pneumothorax following CABG.  ANESTHESIA:  Local 1% lidocaine.  SURGEON:  Kerin Perna, MD.  DESCRIPTION OF PROCEDURE:  The patient is postop day #3.  Morning x-ray showed evidence of a right pneumothorax.  Clinically, the patient's oxygen requirements have increased, but he is hemodynamically stable. Informed consent for right chest tube was obtained after discussion of the benefits and risks of the procedure.  The right chest was prepped and draped as a sterile field.  A proper time-out was performed.  Lidocaine 1% was infiltrated in the 5th interspace at the anterior axillary line.  A small incision was made in the anesthetized area.  Further local 1% lidocaine was infiltrated down to the subcu and intercostal muscle.  A hemostat was used to enter the right pleural space.  Air under pressure immediately exited.  A 20- French chest tube was then placed over trocar and directed to the apex of the right pleural space.  It was connected to a water seal Pleur-evac drainage system and secured with a heavy silk suture.  A sterile dressing was applied.  Chest x-ray is pending.  The patient tolerated the procedure well.     Kerin Perna, M.D.     PV/MEDQ  D:  06/04/2017  T:  06/04/2017  Job:  [id number]

## 2017-06-05 ENCOUNTER — Inpatient Hospital Stay: Payer: Self-pay

## 2017-06-05 ENCOUNTER — Inpatient Hospital Stay (HOSPITAL_COMMUNITY): Payer: Medicare HMO

## 2017-06-05 DIAGNOSIS — I428 Other cardiomyopathies: Secondary | ICD-10-CM

## 2017-06-05 LAB — ECHOCARDIOGRAM COMPLETE
Height: 67 in
Weight: 2828.94 oz

## 2017-06-05 LAB — BASIC METABOLIC PANEL
Anion gap: 8 (ref 5–15)
Anion gap: 12 (ref 5–15)
BUN: 31 mg/dL — ABNORMAL HIGH (ref 6–20)
BUN: 29 mg/dL — ABNORMAL HIGH (ref 6–20)
CHLORIDE: 103 mmol/L (ref 101–111)
CO2: 23 mmol/L (ref 22–32)
CO2: 25 mmol/L (ref 22–32)
CREATININE: 1.43 mg/dL — AB (ref 0.61–1.24)
Calcium: 8.2 mg/dL — ABNORMAL LOW (ref 8.9–10.3)
Calcium: 8.1 mg/dL — ABNORMAL LOW (ref 8.9–10.3)
Chloride: 99 mmol/L — ABNORMAL LOW (ref 101–111)
Creatinine, Ser: 1.39 mg/dL — ABNORMAL HIGH (ref 0.61–1.24)
GFR calc Af Amer: 54 mL/min — ABNORMAL LOW (ref >60)
GFR calc non Af Amer: 47 mL/min — ABNORMAL LOW (ref >60)
GFR, EST AFRICAN AMERICAN: 52 mL/min — AB (ref >60)
GFR, EST NON AFRICAN AMERICAN: 45 mL/min — AB (ref >60)
Glucose, Bld: 105 mg/dL — ABNORMAL HIGH (ref 65–99)
Glucose, Bld: 177 mg/dL — ABNORMAL HIGH (ref 65–99)
POTASSIUM: 4.2 mmol/L (ref 3.5–5.1)
Potassium: 3.5 mmol/L (ref 3.5–5.1)
SODIUM: 134 mmol/L — AB (ref 135–145)
Sodium: 136 mmol/L (ref 135–145)

## 2017-06-05 LAB — POCT I-STAT, CHEM 8
BUN: 14 mg/dL (ref 6–20)
BUN: 11 mg/dL (ref 6–20)
BUN: 15 mg/dL (ref 6–20)
BUN: 15 mg/dL (ref 6–20)
BUN: 13 mg/dL (ref 6–20)
BUN: 30 mg/dL — AB (ref 6–20)
CHLORIDE: 105 mmol/L (ref 101–111)
CHLORIDE: 104 mmol/L (ref 101–111)
CREATININE: 1 mg/dL (ref 0.61–1.24)
CREATININE: 1 mg/dL (ref 0.61–1.24)
CREATININE: 1.4 mg/dL — AB (ref 0.61–1.24)
Calcium, Ion: 1.18 mmol/L (ref 1.15–1.40)
Calcium, Ion: 0.78 mmol/L — CL (ref 1.15–1.40)
Calcium, Ion: 1.13 mmol/L — ABNORMAL LOW (ref 1.15–1.40)
Calcium, Ion: 1.09 mmol/L — ABNORMAL LOW (ref 1.15–1.40)
Calcium, Ion: 0.96 mmol/L — ABNORMAL LOW (ref 1.15–1.40)
Calcium, Ion: 1.12 mmol/L — ABNORMAL LOW (ref 1.15–1.40)
Chloride: 103 mmol/L (ref 101–111)
Chloride: 105 mmol/L (ref 101–111)
Chloride: 104 mmol/L (ref 101–111)
Chloride: 98 mmol/L — ABNORMAL LOW (ref 101–111)
Creatinine, Ser: 0.6 mg/dL — ABNORMAL LOW (ref 0.61–1.24)
Creatinine, Ser: 1 mg/dL (ref 0.61–1.24)
Creatinine, Ser: 1 mg/dL (ref 0.61–1.24)
GLUCOSE: 127 mg/dL — AB (ref 65–99)
GLUCOSE: 124 mg/dL — AB (ref 65–99)
Glucose, Bld: 103 mg/dL — ABNORMAL HIGH (ref 65–99)
Glucose, Bld: 122 mg/dL — ABNORMAL HIGH (ref 65–99)
Glucose, Bld: 92 mg/dL (ref 65–99)
Glucose, Bld: 148 mg/dL — ABNORMAL HIGH (ref 65–99)
HCT: 25 % — ABNORMAL LOW (ref 39.0–52.0)
HEMATOCRIT: 36 % — AB (ref 39.0–52.0)
HEMATOCRIT: 24 % — AB (ref 39.0–52.0)
HEMATOCRIT: 36 % — AB (ref 39.0–52.0)
HEMATOCRIT: 29 % — AB (ref 39.0–52.0)
HEMATOCRIT: 25 % — AB (ref 39.0–52.0)
HEMOGLOBIN: 12.2 g/dL — AB (ref 13.0–17.0)
HEMOGLOBIN: 8.2 g/dL — AB (ref 13.0–17.0)
HEMOGLOBIN: 8.5 g/dL — AB (ref 13.0–17.0)
Hemoglobin: 12.2 g/dL — ABNORMAL LOW (ref 13.0–17.0)
Hemoglobin: 9.9 g/dL — ABNORMAL LOW (ref 13.0–17.0)
Hemoglobin: 8.5 g/dL — ABNORMAL LOW (ref 13.0–17.0)
POTASSIUM: 3.9 mmol/L (ref 3.5–5.1)
POTASSIUM: 4.9 mmol/L (ref 3.5–5.1)
POTASSIUM: 4.1 mmol/L (ref 3.5–5.1)
Potassium: 4 mmol/L (ref 3.5–5.1)
Potassium: 4.1 mmol/L (ref 3.5–5.1)
Potassium: 4 mmol/L (ref 3.5–5.1)
SODIUM: 140 mmol/L (ref 135–145)
Sodium: 140 mmol/L (ref 135–145)
Sodium: 143 mmol/L (ref 135–145)
Sodium: 139 mmol/L (ref 135–145)
Sodium: 141 mmol/L (ref 135–145)
Sodium: 137 mmol/L (ref 135–145)
TCO2: 25 mmol/L (ref 22–32)
TCO2: 26 mmol/L (ref 22–32)
TCO2: 26 mmol/L (ref 22–32)
TCO2: 24 mmol/L (ref 22–32)
TCO2: 23 mmol/L (ref 22–32)
TCO2: 26 mmol/L (ref 22–32)

## 2017-06-05 LAB — POCT I-STAT 3, ART BLOOD GAS (G3+)
Acid-Base Excess: 1 mmol/L (ref 0.0–2.0)
Acid-base deficit: 2 mmol/L (ref 0.0–2.0)
Acid-base deficit: 1 mmol/L (ref 0.0–2.0)
BICARBONATE: 23.5 mmol/L (ref 20.0–28.0)
BICARBONATE: 22.4 mmol/L (ref 20.0–28.0)
BICARBONATE: 23.5 mmol/L (ref 20.0–28.0)
Bicarbonate: 24.6 mmol/L (ref 20.0–28.0)
O2 SAT: 100 %
O2 Saturation: 100 %
O2 Saturation: 100 %
O2 Saturation: 97 %
PCO2 ART: 36.2 mmHg (ref 32.0–48.0)
PCO2 ART: 34.8 mmHg (ref 32.0–48.0)
PH ART: 7.513 — AB (ref 7.350–7.450)
PO2 ART: 476 mmHg — AB (ref 83.0–108.0)
PO2 ART: 319 mmHg — AB (ref 83.0–108.0)
PO2 ART: 88 mmHg (ref 83.0–108.0)
Patient temperature: 98
TCO2: 26 mmol/L (ref 22–32)
TCO2: 24 mmol/L (ref 22–32)
TCO2: 23 mmol/L (ref 22–32)
TCO2: 25 mmol/L (ref 22–32)
pCO2 arterial: 37.5 mmHg (ref 32.0–48.0)
pCO2 arterial: 29.3 mmHg — ABNORMAL LOW (ref 32.0–48.0)
pH, Arterial: 7.425 (ref 7.350–7.450)
pH, Arterial: 7.399 (ref 7.350–7.450)
pH, Arterial: 7.436 (ref 7.350–7.450)
pO2, Arterial: 472 mmHg — ABNORMAL HIGH (ref 83.0–108.0)

## 2017-06-05 LAB — CBC
HCT: 27.3 % — ABNORMAL LOW (ref 39.0–52.0)
HEMOGLOBIN: 9 g/dL — AB (ref 13.0–17.0)
MCH: 30.3 pg (ref 26.0–34.0)
MCHC: 33 g/dL (ref 30.0–36.0)
MCV: 91.9 fL (ref 78.0–100.0)
PLATELETS: 76 10*3/uL — AB (ref 150–400)
RBC: 2.97 MIL/uL — AB (ref 4.22–5.81)
RDW: 14.2 % (ref 11.5–15.5)
WBC: 9.9 10*3/uL (ref 4.0–10.5)

## 2017-06-05 LAB — GLUCOSE, CAPILLARY
GLUCOSE-CAPILLARY: 125 mg/dL — AB (ref 65–99)
GLUCOSE-CAPILLARY: 116 mg/dL — AB (ref 65–99)
GLUCOSE-CAPILLARY: 108 mg/dL — AB (ref 65–99)
Glucose-Capillary: 105 mg/dL — ABNORMAL HIGH (ref 65–99)
Glucose-Capillary: 173 mg/dL — ABNORMAL HIGH (ref 65–99)

## 2017-06-05 LAB — COOXEMETRY PANEL
Carboxyhemoglobin: 0.8 % (ref 0.5–1.5)
Methemoglobin: 1.9 % — ABNORMAL HIGH (ref 0.0–1.5)
O2 Saturation: 39.8 %
Total hemoglobin: 9.1 g/dL — ABNORMAL LOW (ref 12.0–16.0)

## 2017-06-05 LAB — MAGNESIUM: Magnesium: 2 mg/dL (ref 1.7–2.4)

## 2017-06-05 MED ORDER — SODIUM CHLORIDE 0.9% FLUSH
10.0000 mL | Freq: Two times a day (BID) | INTRAVENOUS | Status: DC
  Administered 2017-06-05 – 2017-06-08 (×4): 10 mL

## 2017-06-05 MED ORDER — BOOST PO LIQD
237.0000 mL | Freq: Three times a day (TID) | ORAL | Status: DC
  Administered 2017-06-05 – 2017-06-06 (×3): 237 mL via ORAL
  Filled 2017-06-05 (×10): qty 237

## 2017-06-05 MED ORDER — CARVEDILOL 12.5 MG PO TABS
12.5000 mg | ORAL_TABLET | Freq: Two times a day (BID) | ORAL | Status: DC
  Administered 2017-06-05: 12.5 mg via ORAL
  Filled 2017-06-05: qty 1

## 2017-06-05 MED ORDER — ASPIRIN EC 81 MG PO TBEC
81.0000 mg | DELAYED_RELEASE_TABLET | Freq: Every day | ORAL | Status: DC
  Administered 2017-06-05 – 2017-06-07 (×3): 81 mg via ORAL
  Filled 2017-06-05 (×3): qty 1

## 2017-06-05 MED ORDER — ASPIRIN 81 MG PO CHEW: 81.0000 mg | CHEWABLE_TABLET | Freq: Every day | ORAL | Status: DC

## 2017-06-05 MED ORDER — MILRINONE LACTATE IN DEXTROSE 20-5 MG/100ML-% IV SOLN
0.1250 ug/kg/min | INTRAVENOUS | Status: DC
  Administered 2017-06-05: 0.25 ug/kg/min via INTRAVENOUS
  Administered 2017-06-05: 0.125 ug/kg/min via INTRAVENOUS
  Filled 2017-06-05 (×2): qty 100

## 2017-06-05 MED ORDER — ENOXAPARIN SODIUM 40 MG/0.4ML ~~LOC~~ SOLN: 40.0000 mg | SUBCUTANEOUS | Status: DC

## 2017-06-05 MED ORDER — IPRATROPIUM-ALBUTEROL 0.5-2.5 (3) MG/3ML IN SOLN
RESPIRATORY_TRACT | Status: AC
  Administered 2017-06-05: 3 mL via RESPIRATORY_TRACT
  Filled 2017-06-05: qty 3

## 2017-06-05 MED ORDER — SODIUM CHLORIDE 0.9% FLUSH
10.0000 mL | INTRAVENOUS | Status: DC | PRN
  Administered 2017-06-06: 10 mL
  Filled 2017-06-05: qty 40

## 2017-06-05 MED ORDER — FUROSEMIDE 10 MG/ML IJ SOLN
40.0000 mg | Freq: Two times a day (BID) | INTRAMUSCULAR | Status: DC
  Administered 2017-06-05: 40 mg via INTRAVENOUS
  Filled 2017-06-05 (×2): qty 4

## 2017-06-05 MED ORDER — CHLORHEXIDINE GLUCONATE CLOTH 2 % EX PADS
6.0000 | MEDICATED_PAD | Freq: Every day | CUTANEOUS | Status: DC
  Administered 2017-06-06 – 2017-06-08 (×3): 6 via TOPICAL

## 2017-06-05 MED ORDER — CARVEDILOL 6.25 MG PO TABS
6.2500 mg | ORAL_TABLET | Freq: Two times a day (BID) | ORAL | Status: DC
  Administered 2017-06-06: 6.25 mg via ORAL
  Filled 2017-06-05: qty 1

## 2017-06-05 MED ORDER — IPRATROPIUM-ALBUTEROL 0.5-2.5 (3) MG/3ML IN SOLN
3.0000 mL | Freq: Four times a day (QID) | RESPIRATORY_TRACT | Status: DC
  Administered 2017-06-05 – 2017-06-06 (×4): 3 mL via RESPIRATORY_TRACT
  Filled 2017-06-05 (×3): qty 3

## 2017-06-05 MED ORDER — POTASSIUM CHLORIDE 10 MEQ/50ML IV SOLN
10.0000 meq | INTRAVENOUS | Status: AC | PRN
  Administered 2017-06-05 (×3): 10 meq via INTRAVENOUS
  Filled 2017-06-05 (×3): qty 50

## 2017-06-05 MED ORDER — DEXTROSE 5 % IV SOLN
40.0000 mg/h | INTRAVENOUS | Status: DC
  Administered 2017-06-05: 40 mg/h via INTRAVENOUS
  Filled 2017-06-05 (×2): qty 25

## 2017-06-05 NOTE — Progress Notes (Addendum)
3 Days Post-Op Procedure(s) (LRB): CORONARY ARTERY BYPASS GRAFTING (CABG) x3 Using Left Internal Mammary Artery and Right Leg Greater Saphenous Vein harvested endoscopically (N/A) TRANSESOPHAGEAL ECHOCARDIOGRAM (TEE) (N/A) Subjective: CABG for unstable angina with anterior wall hypokinesia, COPD and preoperative deconditioning  Patient with deterioration in pulmonary status early this a.m. with low O2 sats requiring 2 to 3 hours of BiPAP and Lasix diuresis with improvement.  Maintaining sinus rhythm and satisfactory blood pressure off pressors.  Echocardiogram performed today personally reviewed showing improved global LV function now EF greater than 60%, no pericardial effusion  Patient has fatigue with ambulation but is trying to walk daily.  Physical therapy ordered Patient has had low platelet count Before and after surgery and thrombocytopenia runs in his family.  Von Willebrand's panel preoperative normal Objective: Vital signs in last 24 hours: Temp:  [96.7 F (35.9 C)-98.2 F (36.8 C)] 97.8 F (36.6 C) (04/25 1241) Pulse Rate:  [31-95] 89 (04/25 1615) Cardiac Rhythm: Normal sinus rhythm (04/25 1600) Resp:  [10-21] 17 (04/25 1615) BP: (82-170)/(44-107) 128/107 (04/25 1615) SpO2:  [90 %-100 %] 93 % (04/25 1615) FiO2 (%):  [60 %] 60 % (04/25 0820) Weight:  [176 lb 12.9 oz (80.2 kg)] 176 lb 12.9 oz (80.2 kg) (04/25 0600)  Hemodynamic parameters for last 24 hours:   Stable Intake/Output from previous day: 04/24 0701 - 04/25 0700 In: 742.8 [P.O.:300; I.V.:342.8; IV Piggyback:100] Out: 1360 [Urine:730; Chest Tube:630] Intake/Output this shift: Total I/O In: 248.7 [P.O.:120; I.V.:78.7; IV Piggyback:50] Out: 2475 [Urine:2425; Chest Tube:50]       Exam    General- alert and comfortable, appears weak overall    Neck- no JVD, no cervical adenopathy palpable, no carotid bruit   Lungs- clear without rales, wheezes   Cor- regular rate and rhythm, no murmur , gallop   Abdomen-  soft, non-tender   Extremities - warm, non-tender, minimal edema   Neuro- oriented, appropriate, no focal weakness   Lab Results: Recent Labs    06/04/17 0336 06/05/17 0408  WBC 11.0* 9.9  HGB 8.9* 9.0*  HCT 26.9* 27.3*  PLT 74* 76*   BMET:  Recent Labs    06/05/17 0408 06/05/17 1238  NA 134* 136  K 4.2 3.5  CL 103 99*  CO2 23 25  GLUCOSE 105* 177*  BUN 31* 29*  CREATININE 1.43* 1.39*  CALCIUM 8.2* 8.1*    PT/INR: No results for input(s): LABPROT, INR in the last 72 hours. ABG    Component Value Date/Time   PHART 7.436 06/05/2017 0842   HCO3 23.5 06/05/2017 0842   TCO2 25 06/05/2017 0842   ACIDBASEDEF 1.0 06/05/2017 0842   O2SAT 97.0 06/05/2017 0842   CBG (last 3)  Recent Labs    06/05/17 0741 06/05/17 1239 06/05/17 1339  GLUCAP 105* 125* 173*    Assessment/Plan: S/P Procedure(s) (LRB): CORONARY ARTERY BYPASS GRAFTING (CABG) x3 Using Left Internal Mammary Artery and Right Leg Greater Saphenous Vein harvested endoscopically (N/A) TRANSESOPHAGEAL ECHOCARDIOGRAM (TEE) (N/A) Continue with encouraging diet and ambulation Lasix twice daily intravenously with Foley catheter to monitor output Physical therapy to help with ambulation and assess for post discharge needs Hold Lovenox for persistently low platelet count, aspirin 81 mg BiPAP as needed for O2 sats  LOS: 6 days    Kathlee Nations Trigt III 06/05/2017

## 2017-06-05 NOTE — Progress Notes (Signed)
Patients afternoon dose of Coreg and Furosemide held due to patients continued labile BP.

## 2017-06-05 NOTE — Progress Notes (Signed)
Notified Dr Donata Clay about patient complaining of shortness of breath and dizziness when getting up for morning walk. MD made aware of drop in co ox and current hemodynamic status. Orders received; will continue to monitor patient closely.

## 2017-06-05 NOTE — Progress Notes (Signed)
Called to place patient on bipap per Dr. Donata Clay.  Pt not liking the mask at first coached and advised patient to breathe with machine and let it work for him.  Patient seem to be adjusting better when I was leaving the room.  ABG is due in 1 hour.

## 2017-06-05 NOTE — Progress Notes (Signed)
Patient sitting up in chair, per wife- patient became unresponsive and eyes rolled back into head. When RN came to room to assess patient, pt slow to arouse, neuro intact without focal deficits,  pale, BP 82/49, HR dropped as low as 38. CBG 173. Patient assisted back to bed. Dr Donata Clay made aware. New orders per MD, Patient connected back to pacer AAI at 90.

## 2017-06-05 NOTE — Progress Notes (Signed)
Peripherally Inserted Central Catheter/Midline Placement  The IV Nurse has discussed with the patient and/or persons authorized to consent for the patient, the purpose of this procedure and the potential benefits and risks involved with this procedure.  The benefits include less needle sticks, lab draws from the catheter, and the patient may be discharged home with the catheter. Risks include, but not limited to, infection, bleeding, blood clot (thrombus formation), and puncture of an artery; nerve damage and irregular heartbeat and possibility to perform a PICC exchange if needed/ordered by physician.  Alternatives to this procedure were also discussed.  Bard Power PICC patient education guide, fact sheet on infection prevention and patient information card has been provided to patient /or left at bedside.    PICC/Midline Placement Documentation  PICC Double Lumen 06/05/17 PICC Right Basilic 42 cm 0 cm (Active)  Indication for Insertion or Continuance of Line Prolonged intravenous therapies 06/05/2017  3:00 PM  Exposed Catheter (cm) 0 cm 06/05/2017  3:00 PM  Site Assessment Clean;Dry;Intact 06/05/2017  3:00 PM  Lumen #1 Status Flushed;Blood return noted 06/05/2017  3:00 PM  Lumen #2 Status Flushed;Blood return noted 06/05/2017  3:00 PM  Dressing Type Transparent 06/05/2017  3:00 PM  Dressing Status Clean;Dry;Intact;Antimicrobial disc in place 06/05/2017  3:00 PM  Dressing Change Due 06/12/17 06/05/2017  3:00 PM       Stacie Glaze Horton 06/05/2017, 3:35 PM

## 2017-06-05 NOTE — Progress Notes (Signed)
  Echocardiogram 2D Echocardiogram has been performed.  Janalyn Harder 06/05/2017, 11:33 AM

## 2017-06-06 ENCOUNTER — Inpatient Hospital Stay (HOSPITAL_COMMUNITY): Payer: Medicare HMO

## 2017-06-06 LAB — CBC
HCT: 25.9 % — ABNORMAL LOW (ref 39.0–52.0)
Hemoglobin: 8.6 g/dL — ABNORMAL LOW (ref 13.0–17.0)
MCH: 29.9 pg (ref 26.0–34.0)
MCHC: 33.2 g/dL (ref 30.0–36.0)
MCV: 89.9 fL (ref 78.0–100.0)
Platelets: 74 10*3/uL — ABNORMAL LOW (ref 150–400)
RBC: 2.88 MIL/uL — ABNORMAL LOW (ref 4.22–5.81)
RDW: 13.5 % (ref 11.5–15.5)
WBC: 7.6 10*3/uL (ref 4.0–10.5)

## 2017-06-06 LAB — COMPREHENSIVE METABOLIC PANEL
ALT: 17 U/L (ref 17–63)
AST: 18 U/L (ref 15–41)
Albumin: 2.5 g/dL — ABNORMAL LOW (ref 3.5–5.0)
Alkaline Phosphatase: 40 U/L (ref 38–126)
Anion gap: 9 (ref 5–15)
BUN: 28 mg/dL — ABNORMAL HIGH (ref 6–20)
CO2: 24 mmol/L (ref 22–32)
Calcium: 7.5 mg/dL — ABNORMAL LOW (ref 8.9–10.3)
Chloride: 104 mmol/L (ref 101–111)
Creatinine, Ser: 1.22 mg/dL (ref 0.61–1.24)
GFR calc Af Amer: >60 mL/min (ref >60)
GFR calc non Af Amer: 55 mL/min — ABNORMAL LOW (ref >60)
Glucose, Bld: 94 mg/dL (ref 65–99)
Potassium: 3.3 mmol/L — ABNORMAL LOW (ref 3.5–5.1)
Sodium: 137 mmol/L (ref 135–145)
Total Bilirubin: 0.7 mg/dL (ref 0.3–1.2)
Total Protein: 4.9 g/dL — ABNORMAL LOW (ref 6.5–8.1)

## 2017-06-06 LAB — COOXEMETRY PANEL
Carboxyhemoglobin: 1 % (ref 0.5–1.5)
Methemoglobin: 1.7 % — ABNORMAL HIGH (ref 0.0–1.5)
O2 Saturation: 54.8 %
Total hemoglobin: 9.9 g/dL — ABNORMAL LOW (ref 12.0–16.0)

## 2017-06-06 LAB — GLUCOSE, CAPILLARY
GLUCOSE-CAPILLARY: 108 mg/dL — AB (ref 65–99)
GLUCOSE-CAPILLARY: 97 mg/dL (ref 65–99)
Glucose-Capillary: 120 mg/dL — ABNORMAL HIGH (ref 65–99)
Glucose-Capillary: 113 mg/dL — ABNORMAL HIGH (ref 65–99)

## 2017-06-06 MED ORDER — POTASSIUM CHLORIDE 10 MEQ/50ML IV SOLN
10.0000 meq | INTRAVENOUS | Status: AC
  Administered 2017-06-06 (×3): 10 meq via INTRAVENOUS
  Filled 2017-06-06 (×3): qty 50

## 2017-06-06 MED ORDER — SORBITOL 70 % PO SOLN
30.0000 mL | Freq: Once | ORAL | Status: DC
  Filled 2017-06-06: qty 30

## 2017-06-06 MED ORDER — MIDODRINE HCL 5 MG PO TABS
5.0000 mg | ORAL_TABLET | Freq: Three times a day (TID) | ORAL | Status: DC
  Administered 2017-06-07 – 2017-06-09 (×6): 5 mg via ORAL
  Filled 2017-06-06 (×6): qty 1

## 2017-06-06 MED ORDER — IPRATROPIUM-ALBUTEROL 0.5-2.5 (3) MG/3ML IN SOLN: 3.0000 mL | RESPIRATORY_TRACT | Status: DC | PRN

## 2017-06-06 MED ORDER — FUROSEMIDE 40 MG PO TABS
40.0000 mg | ORAL_TABLET | Freq: Every day | ORAL | Status: DC
  Administered 2017-06-07 – 2017-06-09 (×3): 40 mg via ORAL
  Filled 2017-06-06 (×3): qty 1

## 2017-06-06 MED ORDER — POTASSIUM CHLORIDE 10 MEQ/50ML IV SOLN
10.0000 meq | INTRAVENOUS | Status: AC
  Administered 2017-06-06 (×2): 10 meq via INTRAVENOUS
  Filled 2017-06-06 (×2): qty 50

## 2017-06-06 MED ORDER — FUROSEMIDE 10 MG/ML IJ SOLN
40.0000 mg | Freq: Every day | INTRAMUSCULAR | Status: DC
  Administered 2017-06-06: 40 mg via INTRAVENOUS
  Filled 2017-06-06: qty 4

## 2017-06-06 MED ORDER — FUROSEMIDE 10 MG/ML IJ SOLN: 40.0000 mg | Freq: Every day | INTRAMUSCULAR | Status: DC

## 2017-06-06 MED ORDER — METOLAZONE 5 MG PO TABS
5.0000 mg | ORAL_TABLET | Freq: Once | ORAL | Status: AC
  Administered 2017-06-06: 5 mg via ORAL
  Filled 2017-06-06: qty 1

## 2017-06-06 NOTE — Plan of Care (Signed)
  Problem: Education: Goal: Knowledge of General Education information will improve Outcome: Progressing   Problem: Health Behavior/Discharge Planning: Goal: Ability to manage health-related needs will improve Outcome: Progressing   Problem: Clinical Measurements: Goal: Ability to maintain clinical measurements within normal limits will improve Outcome: Progressing Goal: Will remain free from infection Outcome: Progressing Goal: Diagnostic test results will improve Outcome: Progressing Goal: Respiratory complications will improve Outcome: Progressing Goal: Cardiovascular complication will be avoided Outcome: Progressing   Problem: Activity: Goal: Risk for activity intolerance will decrease Outcome: Progressing   Problem: Nutrition: Goal: Adequate nutrition will be maintained Outcome: Progressing   Problem: Coping: Goal: Level of anxiety will decrease Outcome: Progressing   Problem: Elimination: Goal: Will not experience complications related to bowel motility Outcome: Progressing Goal: Will not experience complications related to urinary retention Outcome: Progressing   Problem: Pain Managment: Goal: General experience of comfort will improve Outcome: Progressing   Problem: Safety: Goal: Ability to remain free from injury will improve Outcome: Progressing   Problem: Skin Integrity: Goal: Risk for impaired skin integrity will decrease Outcome: Progressing   Problem: Education: Goal: Ability to demonstrate proper wound care will improve Outcome: Progressing Goal: Knowledge of disease or condition will improve Outcome: Progressing Goal: Knowledge of the prescribed therapeutic regimen will improve Outcome: Progressing   Problem: Activity: Goal: Risk for activity intolerance will decrease Outcome: Progressing   Problem: Cardiac: Goal: Hemodynamic stability will improve Outcome: Progressing   Problem: Clinical Measurements: Goal: Postoperative  complications will be avoided or minimized Outcome: Progressing   Problem: Respiratory: Goal: Respiratory status will improve Outcome: Progressing   Problem: Skin Integrity: Goal: Wound healing without signs and symptoms of infection Outcome: Progressing Goal: Risk for impaired skin integrity will decrease Outcome: Progressing   Problem: Urinary Elimination: Goal: Ability to achieve and maintain adequate renal perfusion and functioning will improve Outcome: Progressing   

## 2017-06-06 NOTE — Evaluation (Signed)
Physical Therapy Evaluation Patient Details Name: Wayne Mitchell MRN: [id number] DOB: 13-Apr-1935 Today's Date: 06/06/2017   History of Present Illness  40 admitted with [place] s/p CABGx 3. PMHx: [place], COPD, deconditioning, CAD, HTN  Clinical Impression  Pt pleasant and willing to mobilize. Pt educated for all sternal precautions, transfers, safety and gait. Pt with decreased activity tolerance, transfers, gait and functional mobility limited by orthostatic hypotension. Pt with sternal handout provided with pt very receptive. Encouraged mobility with nursing and to state anytime he is symptomatic for hypotension.   Initial BP 117/69 After 100' 78/48 (58), HR 86 BP after sitting 4 min 80/51 (62), HR 81 116/46 end of session in recliner (68), HR 83 SpO2 93% on RA    Follow Up Recommendations Home health PT;Supervision/Assistance - 24 hour    Equipment Recommendations  Rolling walker with 5" wheels;3in1 (PT)    Recommendations for Other Services       Precautions / Restrictions Precautions Precautions: Sternal;Fall Precaution Booklet Issued: Yes (comment) Precaution Comments: watch BP      Mobility  Bed Mobility Overal bed mobility: Needs Assistance Bed Mobility: Rolling;Sidelying to Sit Rolling: Min assist Sidelying to sit: Min assist       General bed mobility comments: cues for sequence, precautions and assist to rotate and elevate trunk  Transfers Overall transfer level: Needs assistance   Transfers: Sit to/from Stand Sit to Stand: Min guard         General transfer comment: cues for hand placement and safety, pivot from chair <>BSC without RW  Ambulation/Gait Ambulation/Gait assistance: Min guard Ambulation Distance (Feet): 100 Feet Assistive device: Rolling walker (2 wheeled) Gait Pattern/deviations: Step-through pattern;Decreased stride length;Trunk flexed   Gait velocity interpretation: <1.8 ft/sec, indicate of risk for recurrent falls General Gait  Details: cues for posture, looking up, breathing technique and self regulation of activity. At end of gait pt reported dizziness with need to sit. BP 78/48 (58) with seated rest BP elevated slightly to MAP of 62 with pt returned to room in chair  Stairs            Wheelchair Mobility    Modified Rankin (Stroke Patients Only)       Balance Overall balance assessment: No apparent balance deficits (not formally assessed)                                           Pertinent Vitals/Pain Pain Assessment: No/denies pain    Home Living Family/patient expects to be discharged to:: Private residence Living Arrangements: Spouse/significant other Available Help at Discharge: Available 24 hours/day Type of Home: House Home Access: Stairs to enter   Secretary/administrator of Steps: 1 Home Layout: One level Home Equipment: None      Prior Function Level of Independence: Independent               Hand Dominance        Extremity/Trunk Assessment   Upper Extremity Assessment Upper Extremity Assessment: Generalized weakness    Lower Extremity Assessment Lower Extremity Assessment: Generalized weakness    Cervical / Trunk Assessment Cervical / Trunk Assessment: Normal  Communication   Communication: No difficulties  Cognition Arousal/Alertness: Awake/alert Behavior During Therapy: WFL for tasks assessed/performed Overall Cognitive Status: Within Functional Limits for tasks assessed  General Comments      Exercises     Assessment/Plan    PT Assessment Patient needs continued PT services  PT Problem List Decreased mobility;Decreased activity tolerance;Decreased knowledge of use of DME;Cardiopulmonary status limiting activity;Decreased knowledge of precautions       PT Treatment Interventions Gait training;Therapeutic exercise;Patient/family education;Functional mobility training;DME  instruction;Therapeutic activities    PT Goals (Current goals can be found in the Care Plan section)  Acute Rehab PT Goals Patient Stated Goal: return home and to golf PT Goal Formulation: With patient Time For Goal Achievement: 06/20/17 Potential to Achieve Goals: Good    Frequency Min 3X/week   Barriers to discharge        Co-evaluation               AM-PAC PT "6 Clicks" Daily Activity  Outcome Measure Difficulty turning over in bed (including adjusting bedclothes, sheets and blankets)?: Unable   Difficulty sitting down on and standing up from a chair with arms (e.g., wheelchair, bedside commode, etc,.)?: A Little Help needed moving to and from a bed to chair (including a wheelchair)?: A Little Help needed walking in hospital room?: A Little Help needed climbing 3-5 steps with a railing? : A Little 6 Click Score: 13    End of Session Equipment Utilized During Treatment: Gait belt Activity Tolerance: Treatment limited secondary to medical complications (Comment) Patient left: in chair;with call bell/phone within reach Nurse Communication: Mobility status;Precautions PT Visit Diagnosis: Other abnormalities of gait and mobility (R26.89);Difficulty in walking, not elsewhere classified (R26.2)    Time: 1610-9604 PT Time Calculation (min) (ACUTE ONLY): 32 min   Charges:   PT Evaluation $PT Eval Moderate Complexity: 1 Mod PT Treatments $Gait Training: 8-22 mins   PT G Codes:        Delaney Meigs, PT [phone]   Calene Paradiso B Horacio Werth 06/06/2017, 2:25 PM

## 2017-06-06 NOTE — Progress Notes (Signed)
Patient ID: Wayne Mitchell, male   DOB: [birth year], 82 y.o.   MRN: [id number] EVENING ROUNDS NOTE :     [address]       [place] [address]             [phone]                 4 Days Post-Op Procedure(s) (LRB): CORONARY ARTERY BYPASS GRAFTING (CABG) x3 Using Left Internal Mammary Artery and Right Leg Greater Saphenous Vein harvested endoscopically (N/A) TRANSESOPHAGEAL ECHOCARDIOGRAM (TEE) (N/A)  Total Length of Stay:  LOS: 7 days  BP (!) 104/51   Pulse 88   Temp 98.9 F (37.2 C) (Oral)   Resp 20   Ht 5\' 7"  (1.702 m)   Wt 169 lb 15.6 oz (77.1 kg)   SpO2 95%   BMI 26.62 kg/m   .Intake/Output      04/25 0701 - 04/26 0700 04/26 0701 - 04/27 0700   P.O. 480 1080   I.V. (mL/kg) 128 (1.7) 25.2 (0.3)   IV Piggyback 200 200   Total Intake(mL/kg) 808 (10.5) 1305.2 (16.9)   Urine (mL/kg/hr) 2920 (1.6) 1925 (2.3)   Stool  1   Chest Tube 60 0   Total Output 2980 1926   Net -2172 -620.9        Stool Occurrence  1 x     . sodium chloride    . lactated ringers Stopped (06/04/17 1556)     Lab Results  Component Value Date   WBC 7.6 06/06/2017   HGB 8.6 (L) 06/06/2017   HCT 25.9 (L) 06/06/2017   PLT 74 (L) 06/06/2017   GLUCOSE 94 06/06/2017   CHOL 130 05/31/2017   TRIG 63 05/31/2017   HDL 56 05/31/2017   LDLCALC 61 05/31/2017   ALT 17 06/06/2017   AST 18 06/06/2017   NA 137 06/06/2017   K 3.3 (L) 06/06/2017   CL 104 06/06/2017   CREATININE 1.22 06/06/2017   BUN 28 (H) 06/06/2017   CO2 24 06/06/2017   TSH 5.209 (H) 06/04/2017   INR 1.42 06/02/2017   HGBA1C 5.6 05/31/2017   Stable day Walked in unit    [name] MD  Beeper [phone] Office [phone] 06/06/2017 5:57 PM

## 2017-06-06 NOTE — Progress Notes (Signed)
   06/06/17 1300 06/06/17 1356 06/06/17 1359  Vitals  BP (!) 120/49 (!) 78/48 (dizzy with ambulation in hall with PT) (!) 80/51 (sitting in chair)  MAP (mmHg) 70 (!) 58 (!) 62  Patient Position (if appropriate)  --   --   --   Pulse Rate 84 89 85  ECG Heart Rate 84 89 85  Resp 17 19 17   Oxygen Therapy  SpO2 95 % 94 % 95 %  O2 Device  --   --   --     06/06/17 1400 06/06/17 1416 06/06/17 1500  Vitals  BP (!) 89/49 (!) 116/46 (!) 120/59  MAP (mmHg) (!) 60  --  77  Patient Position (if appropriate)  --  Sitting  --   Pulse Rate 82 83 81  ECG Heart Rate 82  --  81  Resp 16  --  17  Oxygen Therapy  SpO2 95 % 93 % 95 %  O2 Device Room Air Room Air  --   Dr. Donata Clay notified of pt experiencing syncopal episode while walking with physical therapy. VS as noted above.  Order received to not give Coreg.  Will continue to monitor pt closely.

## 2017-06-06 NOTE — Progress Notes (Signed)
PT Cancellation Note  Patient Details Name: Wayne Mitchell MRN: [id number] DOB: [birth year]   Cancelled Treatment:    Reason Eval/Treat Not Completed: Other (comment)(pt just returned to bed with nursing with chest tube pulled and RN states hold)   [name] 06/06/2017, 11:11 AM  Delaney Meigs, PT [phone]

## 2017-06-06 NOTE — Progress Notes (Signed)
4 Days Post-Op Procedure(s) (LRB): CORONARY ARTERY BYPASS GRAFTING (CABG) x3 Using Left Internal Mammary Artery and Right Leg Greater Saphenous Vein harvested endoscopically (N/A) TRANSESOPHAGEAL ECHOCARDIOGRAM (TEE) (N/A) Subjective: CABG for post MI angina Postoperative right pneumothorax requiring chest tube  Making improvement in exercise tolerance ambulation and overall strength.  While ambulating patient had orthostatic dizziness today after receiving IV Lasix.  His fluid status is much improved and oxygenation significantly better so we willstop IV Lasix and start oral daily dose.  Low-dose midodrine and hold beta-blocker  Objective: Vital signs in last 24 hours: Temp:  [97.8 F (36.6 C)-99 F (37.2 C)] 98.9 F (37.2 C) (04/26 1620) Pulse Rate:  [75-90] 88 (04/26 1700) Cardiac Rhythm: Normal sinus rhythm (04/26 1600) Resp:  [11-25] 20 (04/26 1700) BP: (78-144)/(46-123) 104/51 (04/26 1700) SpO2:  [91 %-100 %] 95 % (04/26 1700) Weight:  [169 lb 15.6 oz (77.1 kg)] 169 lb 15.6 oz (77.1 kg) (04/26 0600)  Hemodynamic parameters for last 24 hours:  Sinus rhythm  Intake/Output from previous day: 04/25 0701 - 04/26 0700 In: 808 [P.O.:480; I.V.:128; IV Piggyback:200] Out: 2980 [Urine:2920; Chest Tube:60] Intake/Output this shift: Total I/O In: 1305.2 [P.O.:1080; I.V.:25.2; IV Piggyback:200] Out: 1926 [Urine:1925; Stool:1]   Lab Results: Recent Labs    06/05/17 0408 06/05/17 1948 06/06/17 0337  WBC 9.9  --  7.6  HGB 9.0* 8.5* 8.6*  HCT 27.3* 25.0* 25.9*  PLT 76*  --  74*   BMET:  Recent Labs    06/05/17 1238 06/05/17 1948 06/06/17 0337  NA 136 137 137  K 3.5 4.0 3.3*  CL 99* 98* 104  CO2 25  --  24  GLUCOSE 177* 124* 94  BUN 29* 30* 28*  CREATININE 1.39* 1.40* 1.22  CALCIUM 8.1*  --  7.5*    PT/INR: No results for input(s): LABPROT, INR in the last 72 hours. ABG    Component Value Date/Time   PHART 7.436 06/05/2017 0842   HCO3 23.5 06/05/2017 0842   TCO2  26 06/05/2017 1948   ACIDBASEDEF 1.0 06/05/2017 0842   O2SAT 54.8 06/06/2017 0355   CBG (last 3)  Recent Labs    06/06/17 0748 06/06/17 1133 06/06/17 1552  GLUCAP 108* 120* 113*    Assessment/Plan: S/P Procedure(s) (LRB): CORONARY ARTERY BYPASS GRAFTING (CABG) x3 Using Left Internal Mammary Artery and Right Leg Greater Saphenous Vein harvested endoscopically (N/A) TRANSESOPHAGEAL ECHOCARDIOGRAM (TEE) (N/A) Follow-up chest x-ray after removal of right chest tube Continue ambulation Hope to transfer to stepdown tomorrow   LOS: 7 days    Kathlee Nations Trigt III 06/06/2017

## 2017-06-07 ENCOUNTER — Inpatient Hospital Stay (HOSPITAL_COMMUNITY): Payer: Medicare HMO

## 2017-06-07 DIAGNOSIS — Z951 Presence of aortocoronary bypass graft: Secondary | ICD-10-CM

## 2017-06-07 LAB — COMPREHENSIVE METABOLIC PANEL
ALT: 22 U/L (ref 17–63)
AST: 21 U/L (ref 15–41)
Albumin: 2.5 g/dL — ABNORMAL LOW (ref 3.5–5.0)
Alkaline Phosphatase: 45 U/L (ref 38–126)
Anion gap: 9 (ref 5–15)
BUN: 23 mg/dL — ABNORMAL HIGH (ref 6–20)
CO2: 29 mmol/L (ref 22–32)
Calcium: 8 mg/dL — ABNORMAL LOW (ref 8.9–10.3)
Chloride: 98 mmol/L — ABNORMAL LOW (ref 101–111)
Creatinine, Ser: 1.19 mg/dL (ref 0.61–1.24)
GFR calc Af Amer: >60 mL/min (ref >60)
GFR calc non Af Amer: 56 mL/min — ABNORMAL LOW (ref >60)
Glucose, Bld: 96 mg/dL (ref 65–99)
Potassium: 3.2 mmol/L — ABNORMAL LOW (ref 3.5–5.1)
Sodium: 136 mmol/L (ref 135–145)
Total Bilirubin: 0.9 mg/dL (ref 0.3–1.2)
Total Protein: 5.2 g/dL — ABNORMAL LOW (ref 6.5–8.1)

## 2017-06-07 LAB — GLUCOSE, CAPILLARY
GLUCOSE-CAPILLARY: 57 mg/dL — AB (ref 65–99)
GLUCOSE-CAPILLARY: 121 mg/dL — AB (ref 65–99)
GLUCOSE-CAPILLARY: 113 mg/dL — AB (ref 65–99)
GLUCOSE-CAPILLARY: 126 mg/dL — AB (ref 65–99)
GLUCOSE-CAPILLARY: 106 mg/dL — AB (ref 65–99)
Glucose-Capillary: 160 mg/dL — ABNORMAL HIGH (ref 65–99)

## 2017-06-07 LAB — CBC
HCT: 26 % — ABNORMAL LOW (ref 39.0–52.0)
Hemoglobin: 8.8 g/dL — ABNORMAL LOW (ref 13.0–17.0)
MCH: 30.3 pg (ref 26.0–34.0)
MCHC: 33.8 g/dL (ref 30.0–36.0)
MCV: 89.7 fL (ref 78.0–100.0)
Platelets: 90 10*3/uL — ABNORMAL LOW (ref 150–400)
RBC: 2.9 MIL/uL — ABNORMAL LOW (ref 4.22–5.81)
RDW: 13.7 % (ref 11.5–15.5)
WBC: 8.5 10*3/uL (ref 4.0–10.5)

## 2017-06-07 MED ORDER — DOCUSATE SODIUM 100 MG PO CAPS
200.0000 mg | ORAL_CAPSULE | Freq: Every day | ORAL | Status: DC
  Administered 2017-06-09: 200 mg via ORAL
  Filled 2017-06-07: qty 2

## 2017-06-07 MED ORDER — METOPROLOL TARTRATE 12.5 MG HALF TABLET
12.5000 mg | ORAL_TABLET | Freq: Two times a day (BID) | ORAL | Status: DC
  Administered 2017-06-07 – 2017-06-09 (×5): 12.5 mg via ORAL
  Filled 2017-06-07 (×5): qty 1

## 2017-06-07 MED ORDER — DEXTROSE 50 % IV SOLN
INTRAVENOUS | Status: AC
  Filled 2017-06-07: qty 50

## 2017-06-07 MED ORDER — ONDANSETRON HCL 4 MG PO TABS: 4.0000 mg | ORAL_TABLET | Freq: Four times a day (QID) | ORAL | Status: DC | PRN

## 2017-06-07 MED ORDER — OXYCODONE HCL 5 MG PO TABS: 5.0000 mg | ORAL_TABLET | ORAL | Status: DC | PRN

## 2017-06-07 MED ORDER — ACETAMINOPHEN 325 MG PO TABS: 650.0000 mg | ORAL_TABLET | Freq: Four times a day (QID) | ORAL | Status: DC | PRN

## 2017-06-07 MED ORDER — MOVING RIGHT ALONG BOOK
Freq: Once | Status: AC
  Administered 2017-06-07: 13:00:00
  Filled 2017-06-07: qty 1

## 2017-06-07 MED ORDER — SODIUM CHLORIDE 0.9 % IV SOLN: 250.0000 mL | INTRAVENOUS | Status: DC | PRN

## 2017-06-07 MED ORDER — POTASSIUM CHLORIDE 10 MEQ/50ML IV SOLN
10.0000 meq | INTRAVENOUS | Status: AC
  Administered 2017-06-07 (×3): 10 meq via INTRAVENOUS
  Filled 2017-06-07 (×3): qty 50

## 2017-06-07 MED ORDER — BISACODYL 5 MG PO TBEC: 10.0000 mg | DELAYED_RELEASE_TABLET | Freq: Every day | ORAL | Status: DC | PRN

## 2017-06-07 MED ORDER — BISACODYL 10 MG RE SUPP: 10.0000 mg | Freq: Every day | RECTAL | Status: DC | PRN

## 2017-06-07 MED ORDER — SODIUM CHLORIDE 0.9% FLUSH: 3.0000 mL | INTRAVENOUS | Status: DC | PRN

## 2017-06-07 MED ORDER — PANTOPRAZOLE SODIUM 40 MG PO TBEC
40.0000 mg | DELAYED_RELEASE_TABLET | Freq: Every day | ORAL | Status: DC
  Administered 2017-06-08 – 2017-06-09 (×2): 40 mg via ORAL
  Filled 2017-06-07 (×2): qty 1

## 2017-06-07 MED ORDER — ONDANSETRON HCL 4 MG/2ML IJ SOLN: 4.0000 mg | Freq: Four times a day (QID) | INTRAMUSCULAR | Status: DC | PRN

## 2017-06-07 MED ORDER — TRAMADOL HCL 50 MG PO TABS: 50.0000 mg | ORAL_TABLET | ORAL | Status: DC | PRN

## 2017-06-07 MED ORDER — SODIUM CHLORIDE 0.9% FLUSH: 3.0000 mL | Freq: Two times a day (BID) | INTRAVENOUS | Status: DC

## 2017-06-07 MED ORDER — ASPIRIN EC 81 MG PO TBEC
81.0000 mg | DELAYED_RELEASE_TABLET | Freq: Every day | ORAL | Status: DC
  Administered 2017-06-08 – 2017-06-09 (×2): 81 mg via ORAL
  Filled 2017-06-07 (×2): qty 1

## 2017-06-07 NOTE — Plan of Care (Signed)
Progressing in ambulation, off o2. Nursing encouraging Is, Patient does state he does not do this as often as he should. Blood sugar low this am, ate only 25-30 of breakfast, bs staying up. Move along the continuum of care probable later today. Foley d/ced/

## 2017-06-07 NOTE — Progress Notes (Signed)
Patient ID: Wayne Mitchell, male   DOB: 04/12/1935, 82 y.o.   MRN: [id number] EVENING ROUNDS NOTE :     [address]       [place] [address]             [phone]                 5 Days Post-Op Procedure(s) (LRB): CORONARY ARTERY BYPASS GRAFTING (CABG) x3 Using Left Internal Mammary Artery and Right Leg Greater Saphenous Vein harvested endoscopically (N/A) TRANSESOPHAGEAL ECHOCARDIOGRAM (TEE) (N/A)  Total Length of Stay:  LOS: 8 days  BP (!) 144/66   Pulse 88   Temp 98.2 F (36.8 C) (Oral)   Resp 18   Ht 5\' 7"  (1.702 m)   Wt 164 lb 14.5 oz (74.8 kg)   SpO2 97%   BMI 25.83 kg/m   .Intake/Output      04/27 0701 - 04/28 0700   P.O. 360   I.V. (mL/kg)    IV Piggyback    Total Intake(mL/kg) 360 (4.8)   Urine (mL/kg/hr) 850 (0.9)   Stool    Chest Tube    Total Output 850   Net -490       Urine Occurrence 1 x     . sodium chloride       Lab Results  Component Value Date   WBC 8.5 06/07/2017   HGB 8.8 (L) 06/07/2017   HCT 26.0 (L) 06/07/2017   PLT 90 (L) 06/07/2017   GLUCOSE 96 06/07/2017   CHOL 130 05/31/2017   TRIG 63 05/31/2017   HDL 56 05/31/2017   LDLCALC 61 05/31/2017   ALT 22 06/07/2017   AST 21 06/07/2017   NA 136 06/07/2017   K 3.2 (L) 06/07/2017   CL 98 (L) 06/07/2017   CREATININE 1.19 06/07/2017   BUN 23 (H) 06/07/2017   CO2 29 06/07/2017   TSH 5.209 (H) 06/04/2017   INR 1.42 06/02/2017   HGBA1C 5.6 05/31/2017   stablr day,waiting for 4e bed   [name] MD  Beeper [phone] Office [phone] 06/07/2017 7:34 PM

## 2017-06-07 NOTE — Progress Notes (Signed)
BS 57, no symptomatology, breakfast here, eating and tolerating well. Will repeat BS per hypoglycemic protocol

## 2017-06-07 NOTE — Progress Notes (Signed)
Frequently tired, clustering care and frequent breaks in between activities.

## 2017-06-07 NOTE — Progress Notes (Signed)
Patient ID: Wayne Mitchell, male   DOB: 03/17/1935, 82 y.o.   MRN: [id number] TCTS DAILY ICU PROGRESS NOTE                   [address]            [place] [address]          [phone]   5 Days Post-Op Procedure(s) (LRB): CORONARY ARTERY BYPASS GRAFTING (CABG) x3 Using Left Internal Mammary Artery and Right Leg Greater Saphenous Vein harvested endoscopically (N/A) TRANSESOPHAGEAL ECHOCARDIOGRAM (TEE) (N/A)  Total Length of Stay:  LOS: 8 days   Subjective: Walked around the unit this morning, currently on room air O2 sats 96%, notes his breathing is much better than it has been  Objective: Vital signs in last 24 hours: Temp:  [98.4 F (36.9 C)-99 F (37.2 C)] 98.7 F (37.1 C) (04/27 0733) Pulse Rate:  [72-92] 92 (04/27 0800) Cardiac Rhythm: Normal sinus rhythm (04/27 0800) Resp:  [13-20] 18 (04/27 0800) BP: (78-139)/(46-85) 125/63 (04/27 0800) SpO2:  [91 %-97 %] 95 % (04/27 0800) Weight:  [164 lb 14.5 oz (74.8 kg)] 164 lb 14.5 oz (74.8 kg) (04/27 0500)  Filed Weights   06/05/17 0600 06/06/17 0600 06/07/17 0500  Weight: 176 lb 12.9 oz (80.2 kg) 169 lb 15.6 oz (77.1 kg) 164 lb 14.5 oz (74.8 kg)    Weight change: -5 lb 1.1 oz (-2.3 kg)   Hemodynamic parameters for last 24 hours:    Intake/Output from previous day: 04/26 0701 - 04/27 0700 In: 1655.2 [P.O.:1230; I.V.:25.2; IV Piggyback:400] Out: 2736 [Urine:2735; Stool:1]  Intake/Output this shift: Total I/O In: 120 [P.O.:120] Out: -   Current Meds: Scheduled Meds: . acetaminophen  1,000 mg Oral Q6H   Or  . acetaminophen (TYLENOL) oral liquid 160 mg/5 mL  1,000 mg Per Tube Q6H  . aspirin EC  81 mg Oral Daily   Or  . aspirin  81 mg Per Tube Daily  . atorvastatin  20 mg Oral Daily  . bisacodyl  10 mg Oral Daily   Or  . bisacodyl  10 mg Rectal Daily  . Chlorhexidine Gluconate Cloth  6 each Topical Daily  . dextrose      . docusate sodium  200 mg Oral Daily  . ferrous fumarate-b12-vitamic  C-folic acid  1 capsule Oral Q breakfast  . furosemide  40 mg Oral Daily  . insulin aspart  0-15 Units Subcutaneous TID WC  . insulin aspart  0-5 Units Subcutaneous QHS  . lactose free nutrition  237 mL Oral TID WC  . levothyroxine  112 mcg Oral QAC breakfast  . mouth rinse  15 mL Mouth Rinse BID  . metoCLOPramide (REGLAN) injection  10 mg Intravenous Q6H  . midodrine  5 mg Oral TID WC  . pantoprazole  40 mg Oral Daily  . simethicone  80 mg Oral QID  . sodium chloride flush  10-40 mL Intracatheter Q12H  . sodium chloride flush  3 mL Intravenous Q12H  . sorbitol  30 mL Oral Once   Continuous Infusions: . sodium chloride    . lactated ringers Stopped (06/04/17 1556)   PRN Meds:.ipratropium-albuterol, morphine injection, ondansetron (ZOFRAN) IV, oxyCODONE, sodium chloride flush, sodium chloride flush, traMADol  General appearance: alert and cooperative Neurologic: intact Heart: regular rate and rhythm, S1, S2 normal, no murmur, click, rub or gallop Lungs: diminished breath sounds bibasilar Abdomen: soft, non-tender; bowel sounds normal; no masses,  no organomegaly Extremities: extremities  normal, atraumatic, no cyanosis or edema and Homans sign is negative, no sign of DVT Wound: Sternum is stable  Lab Results: CBC: Recent Labs    06/06/17 0337 06/07/17 0248  WBC 7.6 8.5  HGB 8.6* 8.8*  HCT 25.9* 26.0*  PLT 74* 90*   BMET:  Recent Labs    06/06/17 0337 06/07/17 0248  NA 137 136  K 3.3* 3.2*  CL 104 98*  CO2 24 29  GLUCOSE 94 96  BUN 28* 23*  CREATININE 1.22 1.19  CALCIUM 7.5* 8.0*    CMET: Lab Results  Component Value Date   WBC 8.5 06/07/2017   HGB 8.8 (L) 06/07/2017   HCT 26.0 (L) 06/07/2017   PLT 90 (L) 06/07/2017   GLUCOSE 96 06/07/2017   CHOL 130 05/31/2017   TRIG 63 05/31/2017   HDL 56 05/31/2017   LDLCALC 61 05/31/2017   ALT 22 06/07/2017   AST 21 06/07/2017   NA 136 06/07/2017   K 3.2 (L) 06/07/2017   CL 98 (L) 06/07/2017   CREATININE 1.19  06/07/2017   BUN 23 (H) 06/07/2017   CO2 29 06/07/2017   TSH 5.209 (H) 06/04/2017   INR 1.42 06/02/2017   HGBA1C 5.6 05/31/2017      PT/INR: No results for input(s): LABPROT, INR in the last 72 hours. Radiology: Dg Chest Port 1 View  Result Date: 06/07/2017 CLINICAL DATA:  CABG. EXAM: PORTABLE CHEST 1 VIEW COMPARISON:  Chest x-ray from yesterday. FINDINGS: Unchanged right PICC line. Interval removal of the right chest tube. Stable cardiomediastinal silhouette status post CABG. Normal pulmonary vascularity. Unchanged elevation of the left hemidiaphragm with left greater than right bibasilar atelectasis. No pleural effusion or pneumothorax. No acute osseous abnormality. IMPRESSION: 1. Interval removal of the right chest tube.  No pneumothorax. 2. Stable bibasilar atelectasis. Electronically Signed   By: Obie Dredge M.D.   On: 06/07/2017 08:53     Assessment/Plan: S/P Procedure(s) (LRB): CORONARY ARTERY BYPASS GRAFTING (CABG) x3 Using Left Internal Mammary Artery and Right Leg Greater Saphenous Vein harvested endoscopically (N/A) TRANSESOPHAGEAL ECHOCARDIOGRAM (TEE) (N/A) Mobilize Plan for transfer to step-down: see transfer orders Hypokalemia, potassium being replaced DC Foley and monitor for urinary retention   [name] 06/07/2017 9:09 AM

## 2017-06-08 LAB — BASIC METABOLIC PANEL
Anion gap: 10 (ref 5–15)
BUN: 20 mg/dL (ref 6–20)
CO2: 32 mmol/L (ref 22–32)
Calcium: 8.5 mg/dL — ABNORMAL LOW (ref 8.9–10.3)
Chloride: 95 mmol/L — ABNORMAL LOW (ref 101–111)
Creatinine, Ser: 1.21 mg/dL (ref 0.61–1.24)
GFR calc Af Amer: >60 mL/min (ref >60)
GFR calc non Af Amer: 55 mL/min — ABNORMAL LOW (ref >60)
Glucose, Bld: 105 mg/dL — ABNORMAL HIGH (ref 65–99)
Potassium: 3.1 mmol/L — ABNORMAL LOW (ref 3.5–5.1)
Sodium: 137 mmol/L (ref 135–145)

## 2017-06-08 LAB — CBC
HCT: 28.4 % — ABNORMAL LOW (ref 39.0–52.0)
Hemoglobin: 9.4 g/dL — ABNORMAL LOW (ref 13.0–17.0)
MCH: 29.5 pg (ref 26.0–34.0)
MCHC: 33.1 g/dL (ref 30.0–36.0)
MCV: 89 fL (ref 78.0–100.0)
Platelets: 121 10*3/uL — ABNORMAL LOW (ref 150–400)
RBC: 3.19 MIL/uL — ABNORMAL LOW (ref 4.22–5.81)
RDW: 13.4 % (ref 11.5–15.5)
WBC: 8.2 10*3/uL (ref 4.0–10.5)

## 2017-06-08 LAB — GLUCOSE, CAPILLARY
Glucose-Capillary: 118 mg/dL — ABNORMAL HIGH (ref 65–99)
Glucose-Capillary: 125 mg/dL — ABNORMAL HIGH (ref 65–99)

## 2017-06-08 MED ORDER — POTASSIUM CHLORIDE 10 MEQ/100ML IV SOLN
10.0000 meq | INTRAVENOUS | Status: AC
  Administered 2017-06-08 (×3): 10 meq via INTRAVENOUS
  Filled 2017-06-08 (×3): qty 100

## 2017-06-08 NOTE — Plan of Care (Signed)
Patient offers no complaints of pain or discomfort at this time.  Ambulating to bathroom with and signs of distress.  Monitoring continues.

## 2017-06-08 NOTE — Plan of Care (Signed)
Patient sitting in chair, offers no complaints at this time, tolerating well.  Discussed the effects of elevated blood sugar and relationship to healing process.  Monitoring continues.

## 2017-06-08 NOTE — Progress Notes (Signed)
Patient ID: Wayne Mitchell, male   DOB: 1935-12-31, 82 y.o.   MRN: [id number] TCTS DAILY ICU PROGRESS NOTE                   [address]            [place] [address]          [phone]   6 Days Post-Op Procedure(s) (LRB): CORONARY ARTERY BYPASS GRAFTING (CABG) x3 Using Left Internal Mammary Artery and Right Leg Greater Saphenous Vein harvested endoscopically (N/A) TRANSESOPHAGEAL ECHOCARDIOGRAM (TEE) (N/A)  Total Length of Stay:  LOS: 9 days   Subjective: Patient comfortable during the night, on room air  Objective: Vital signs in last 24 hours: Temp:  [98.1 F (36.7 C)-99.1 F (37.3 C)] 99.1 F (37.3 C) (04/28 0700) Pulse Rate:  [76-99] 99 (04/28 0900) Cardiac Rhythm: Normal sinus rhythm (04/28 0900) Resp:  [13-21] 21 (04/28 0900) BP: (116-159)/(46-95) 143/59 (04/28 0900) SpO2:  [92 %-99 %] 98 % (04/28 0900) Weight:  [164 lb 14.5 oz (74.8 kg)] 164 lb 14.5 oz (74.8 kg) (04/28 0435)  Filed Weights   06/06/17 0600 06/07/17 0500 06/08/17 0435  Weight: 169 lb 15.6 oz (77.1 kg) 164 lb 14.5 oz (74.8 kg) 164 lb 14.5 oz (74.8 kg)    Weight change: 0 lb (0 kg)   Hemodynamic parameters for last 24 hours:    Intake/Output from previous day: 04/27 0701 - 04/28 0700 In: 360 [P.O.:360] Out: 1775 [Urine:1775]  Intake/Output this shift: Total I/O In: 200 [IV Piggyback:200] Out: 200 [Urine:200]  Current Meds: Scheduled Meds: . aspirin EC  81 mg Oral Daily  . atorvastatin  20 mg Oral Daily  . Chlorhexidine Gluconate Cloth  6 each Topical Daily  . docusate sodium  200 mg Oral Daily  . ferrous fumarate-b12-vitamic C-folic acid  1 capsule Oral Q breakfast  . furosemide  40 mg Oral Daily  . insulin aspart  0-15 Units Subcutaneous TID WC  . insulin aspart  0-5 Units Subcutaneous QHS  . levothyroxine  112 mcg Oral QAC breakfast  . mouth rinse  15 mL Mouth Rinse BID  . metoprolol tartrate  12.5 mg Oral BID  . midodrine  5 mg Oral TID WC  . pantoprazole  40  mg Oral QAC breakfast  . simethicone  80 mg Oral QID  . sodium chloride flush  10-40 mL Intracatheter Q12H  . sodium chloride flush  3 mL Intravenous Q12H   Continuous Infusions: . sodium chloride     PRN Meds:.sodium chloride, acetaminophen, bisacodyl **OR** bisacodyl, ipratropium-albuterol, ondansetron **OR** ondansetron (ZOFRAN) IV, oxyCODONE, sodium chloride flush, sodium chloride flush, traMADol  General appearance: alert and cooperative Neurologic: intact Heart: regular rate and rhythm, S1, S2 normal, no murmur, click, rub or gallop Lungs: clear to auscultation bilaterally Abdomen: soft, non-tender; bowel sounds normal; no masses,  no organomegaly Extremities: extremities normal, atraumatic, no cyanosis or edema and Homans sign is negative, no sign of DVT Wound: Sternum stable  Lab Results: CBC: Recent Labs    06/07/17 0248 06/08/17 0320  WBC 8.5 8.2  HGB 8.8* 9.4*  HCT 26.0* 28.4*  PLT 90* 121*   BMET:  Recent Labs    06/07/17 0248 06/08/17 0320  NA 136 137  K 3.2* 3.1*  CL 98* 95*  CO2 29 32  GLUCOSE 96 105*  BUN 23* 20  CREATININE 1.19 1.21  CALCIUM 8.0* 8.5*    CMET: Lab Results  Component Value Date  WBC 8.2 06/08/2017   HGB 9.4 (L) 06/08/2017   HCT 28.4 (L) 06/08/2017   PLT 121 (L) 06/08/2017   GLUCOSE 105 (H) 06/08/2017   CHOL 130 05/31/2017   TRIG 63 05/31/2017   HDL 56 05/31/2017   LDLCALC 61 05/31/2017   ALT 22 06/07/2017   AST 21 06/07/2017   NA 137 06/08/2017   K 3.1 (L) 06/08/2017   CL 95 (L) 06/08/2017   CREATININE 1.21 06/08/2017   BUN 20 06/08/2017   CO2 32 06/08/2017   TSH 5.209 (H) 06/04/2017   INR 1.42 06/02/2017   HGBA1C 5.6 05/31/2017      PT/INR: No results for input(s): LABPROT, INR in the last 72 hours. Radiology: No results found.   Assessment/Plan: S/P Procedure(s) (LRB): CORONARY ARTERY BYPASS GRAFTING (CABG) x3 Using Left Internal Mammary Artery and Right Leg Greater Saphenous Vein harvested endoscopically  (N/A) TRANSESOPHAGEAL ECHOCARDIOGRAM (TEE) (N/A) Plan for transfer to step-down: see transfer orders transfer yesterday still waiting for bed Hypokalemia, replace potassium Possible discharge 1 to 2 days   [name] 06/08/2017 9:52 AM

## 2017-06-08 NOTE — Progress Notes (Signed)
Patient ID: Wayne Mitchell, male   DOB: [birth year], 82 y.o.   MRN: [id number] EVENING ROUNDS NOTE :     [address]       [place] [address]             [phone]                 6 Days Post-Op Procedure(s) (LRB): CORONARY ARTERY BYPASS GRAFTING (CABG) x3 Using Left Internal Mammary Artery and Right Leg Greater Saphenous Vein harvested endoscopically (N/A) TRANSESOPHAGEAL ECHOCARDIOGRAM (TEE) (N/A)  Total Length of Stay:  LOS: 9 days  BP 118/60 (BP Location: Left Arm)   Pulse 83   Temp 98.7 F (37.1 C) (Oral)   Resp (!) 21   Ht 5\' 7"  (1.702 m)   Wt 164 lb 14.5 oz (74.8 kg)   SpO2 92%   BMI 25.83 kg/m   .Intake/Output      04/28 0701 - 04/29 0700   P.O. 360   IV Piggyback 300   Total Intake(mL/kg) 660 (8.8)   Urine (mL/kg/hr) 900 (1)   Total Output 900   Net -240            Lab Results  Component Value Date   WBC 8.2 06/08/2017   HGB 9.4 (L) 06/08/2017   HCT 28.4 (L) 06/08/2017   PLT 121 (L) 06/08/2017   GLUCOSE 105 (H) 06/08/2017   CHOL 130 05/31/2017   TRIG 63 05/31/2017   HDL 56 05/31/2017   LDLCALC 61 05/31/2017   ALT 22 06/07/2017   AST 21 06/07/2017   NA 137 06/08/2017   K 3.1 (L) 06/08/2017   CL 95 (L) 06/08/2017   CREATININE 1.21 06/08/2017   BUN 20 06/08/2017   CO2 32 06/08/2017   TSH 5.209 (H) 06/04/2017   INR 1.42 06/02/2017   HGBA1C 5.6 05/31/2017   Waiting for 4e bed    [name] MD  Beeper [phone] Office [phone] 06/08/2017 7:29 PM

## 2017-06-08 NOTE — Plan of Care (Signed)
Ambulating much better, still tired, but less tired than before. Continue to cluster care with rest breaks. Voiding post lasix, eating a little more than before, from 25% to 35% of meals. Read moving along book asked pertinent questions.  Awaiting a bed on 4 E. Plan to go home with family support, and do cardiac rehabilitation

## 2017-06-09 LAB — BASIC METABOLIC PANEL
Anion gap: 9 (ref 5–15)
BUN: 16 mg/dL (ref 6–20)
CO2: 32 mmol/L (ref 22–32)
Calcium: 8.3 mg/dL — ABNORMAL LOW (ref 8.9–10.3)
Chloride: 96 mmol/L — ABNORMAL LOW (ref 101–111)
Creatinine, Ser: 1.25 mg/dL — ABNORMAL HIGH (ref 0.61–1.24)
GFR calc Af Amer: >60 mL/min (ref >60)
GFR calc non Af Amer: 53 mL/min — ABNORMAL LOW (ref >60)
Glucose, Bld: 103 mg/dL — ABNORMAL HIGH (ref 65–99)
Potassium: 2.7 mmol/L — CL (ref 3.5–5.1)
Sodium: 137 mmol/L (ref 135–145)

## 2017-06-09 LAB — CBC
HCT: 28 % — ABNORMAL LOW (ref 39.0–52.0)
Hemoglobin: 9.3 g/dL — ABNORMAL LOW (ref 13.0–17.0)
MCH: 29.5 pg (ref 26.0–34.0)
MCHC: 33.2 g/dL (ref 30.0–36.0)
MCV: 88.9 fL (ref 78.0–100.0)
Platelets: 127 10*3/uL — ABNORMAL LOW (ref 150–400)
RBC: 3.15 MIL/uL — ABNORMAL LOW (ref 4.22–5.81)
RDW: 13.5 % (ref 11.5–15.5)
WBC: 9.3 10*3/uL (ref 4.0–10.5)

## 2017-06-09 LAB — MAGNESIUM: Magnesium: 1.7 mg/dL (ref 1.7–2.4)

## 2017-06-09 MED ORDER — MAGNESIUM SULFATE 2 GM/50ML IV SOLN
2.0000 g | Freq: Once | INTRAVENOUS | Status: AC
  Administered 2017-06-09: 2 g via INTRAVENOUS
  Filled 2017-06-09: qty 50

## 2017-06-09 MED ORDER — FUROSEMIDE 40 MG PO TABS: 40.0000 mg | ORAL_TABLET | Freq: Every day | ORAL | 0 refills | Status: DC

## 2017-06-09 MED ORDER — POTASSIUM CHLORIDE 10 MEQ/50ML IV SOLN
10.0000 meq | INTRAVENOUS | Status: AC
  Administered 2017-06-09 (×3): 10 meq via INTRAVENOUS
  Filled 2017-06-09 (×3): qty 50

## 2017-06-09 MED ORDER — POTASSIUM CHLORIDE 10 MEQ/50ML IV SOLN
10.0000 meq | Freq: Once | INTRAVENOUS | Status: AC
  Administered 2017-06-09: 10 meq via INTRAVENOUS
  Filled 2017-06-09: qty 50

## 2017-06-09 MED ORDER — ACETAMINOPHEN 325 MG PO TABS: 650.0000 mg | ORAL_TABLET | Freq: Four times a day (QID) | ORAL | Status: DC | PRN

## 2017-06-09 MED ORDER — CARVEDILOL 3.125 MG PO TABS
3.1250 mg | ORAL_TABLET | Freq: Every day | ORAL | Status: DC
Start: 2017-06-09 — End: 2017-06-09
  Administered 2017-06-09: 3.125 mg via ORAL
  Filled 2017-06-09: qty 1

## 2017-06-09 MED ORDER — CARVEDILOL 3.125 MG PO TABS: 3.1250 mg | ORAL_TABLET | Freq: Every day | ORAL | 3 refills | Status: DC

## 2017-06-09 MED ORDER — TRAMADOL HCL 50 MG PO TABS: 50.0000 mg | ORAL_TABLET | ORAL | 0 refills | Status: DC | PRN

## 2017-06-09 MED ORDER — POTASSIUM CHLORIDE 20 MEQ PO PACK: 20.0000 meq | PACK | Freq: Once | ORAL | Status: DC

## 2017-06-09 NOTE — Progress Notes (Signed)
Discharge teaching completed. Patient and family state no further questions. Pt reports no pain.  Discharged to home, with wife, in stable condition. Alphonzo Dublin, RN 06/09/2017 [phone]

## 2017-06-09 NOTE — Progress Notes (Signed)
7 Days Post-Op Procedure(s) (LRB): CORONARY ARTERY BYPASS GRAFTING (CABG) x3 Using Left Internal Mammary Artery and Right Leg Greater Saphenous Vein harvested endoscopically (N/A) TRANSESOPHAGEAL ECHOCARDIOGRAM (TEE) (N/A) Subjective: Much stronger 1 week after CABG Nsr, off O2, ambulating w/o dizziness CXR clear Incisions clean, EPWs out  Objective: Vital signs in last 24 hours: Temp:  [97.4 F (36.3 C)-98.8 F (37.1 C)] 97.8 F (36.6 C) (04/29 0700) Pulse Rate:  [77-90] 83 (04/28 1525) Cardiac Rhythm: Normal sinus rhythm (04/29 0800) Resp:  [13-21] 17 (04/29 0800) BP: (117-125)/(56-89) 120/89 (04/29 0800) SpO2:  [92 %-99 %] 95 % (04/29 0800) Weight:  [158 lb 15.2 oz (72.1 kg)] 158 lb 15.2 oz (72.1 kg) (04/29 0500)  Hemodynamic parameters for last 24 hours:  nsr  Intake/Output from previous day: 04/28 0701 - 04/29 0700 In: 720 [P.O.:360; IV Piggyback:300] Out: 1150 [Urine:1150] Intake/Output this shift: No intake/output data recorded.       Exam    General- alert and comfortable    Neck- no JVD, no cervical adenopathy palpable, no carotid bruit   Lungs- clear without rales, wheezes   Cor- regular rate and rhythm, no murmur , gallop   Abdomen- soft, non-tender   Extremities - warm, non-tender, minimal edema   Neuro- oriented, appropriate, no focal weakness   Lab Results: Recent Labs    06/08/17 0320 06/09/17 0308  WBC 8.2 9.3  HGB 9.4* 9.3*  HCT 28.4* 28.0*  PLT 121* 127*   BMET:  Recent Labs    06/08/17 0320 06/09/17 0308  NA 137 137  K 3.1* 2.7*  CL 95* 96*  CO2 32 32  GLUCOSE 105* 103*  BUN 20 16  CREATININE 1.21 1.25*  CALCIUM 8.5* 8.3*    PT/INR: No results for input(s): LABPROT, INR in the last 72 hours. ABG    Component Value Date/Time   PHART 7.436 06/05/2017 0842   HCO3 23.5 06/05/2017 0842   TCO2 26 06/05/2017 1948   ACIDBASEDEF 1.0 06/05/2017 0842   O2SAT 54.8 06/06/2017 0355   CBG (last 3)  Recent Labs    06/07/17 2121  06/08/17 0810 06/08/17 1217  GLUCAP 106* 118* 125*    Assessment/Plan: S/P Procedure(s) (LRB): CORONARY ARTERY BYPASS GRAFTING (CABG) x3 Using Left Internal Mammary Artery and Right Leg Greater Saphenous Vein harvested endoscopically (N/A) TRANSESOPHAGEAL ECHOCARDIOGRAM (TEE) (N/A) DC home Instructions reviewed with patient Low dose coreg fpr preop MI- has had orthostatic dizziness with regular dose beta blocker   LOS: 10 days    Wayne Mitchell 06/09/2017

## 2017-06-09 NOTE — Care Management Note (Signed)
Case Management Note Donn Pierini RN, BSN Unit 4E-Case Manager- 2H coverage [phone]  Patient Details  Name: Wayne Mitchell MRN: [id number] Date of Birth: [birth year]  Subjective/Objective:  Pt admitted with [place], per cath severe CAD- s/p CABGx3 on 06/02/17                   Action/Plan: PTA Pt lived at home with spouse- CM to follow for transition of care needs- PCP- Georgann Housekeeper  Expected Discharge Date:  06/09/17               Expected Discharge Plan:  Home/Self Care  In-House Referral:     Discharge planning Services  CM Consult  Post Acute Care Choice:  Durable Medical Equipment, Home Health Choice offered to:  Patient  DME Arranged:  Walker rolling DME Agency:  [facility]  HH Arranged:  RN, Disease Management HH Agency:  [facility]  Status of Service:  Completed, signed off  If discussed at Long Length of Stay Meetings, dates discussed:    Discharge Disposition: home/home health   Additional Comments:  06/09/17- 1130- Donn Pierini RN, CM- pt for d/c home today- [facility] and DME- RW- spoke with pt at bedside- pt agreeable to [facility]- choice offered for [facility] agency- per pt he does not have a preference as long as in network with insurance- discussed using [facility] which pt is ok with- referral called to Alvino Chapel with [facility]- referral has been accepted for [facility]- also notified James with [facility] for RW- which will be delivered to room prior to discharge. Pt to return home with wife.   Darrold Span, RN 06/09/2017, 12:08 PM

## 2017-06-09 NOTE — Progress Notes (Signed)
CARDIAC REHAB PHASE I   PRE:  Rate/Rhythm: 81 SR with PACs/PVCs    BP: sitting 102/69    SaO2: 95 RA  MODE:  Ambulation: 370 ft   POST:  Rate/Rhythm: 102 ST  Pt moving well. Walked with RW, steady, no c/o. Ed completed with pt and wife with good reception. Will refer to [place] [facility]. Pt needs to do more IS/flutter. Pt will need RW for home, I notified CM. Set up d/c video and gave them [facility] booklet.  1000-1101  Wayne Mitchell CES, ACSM 06/09/2017 10:59 AM

## 2017-06-09 NOTE — Progress Notes (Signed)
Spoke with primary nurse regarding PICC removal order. RN requests removal around 2pm. Consult updated

## 2017-06-10 ENCOUNTER — Telehealth: Payer: Self-pay | Admitting: Cardiology

## 2017-06-10 NOTE — Telephone Encounter (Signed)
Called and left detailed message, ok per DPR.  Advised pt to keep appt with our office on 5/9.  Advised pt's usually come into cardiology after bypass and then see surgeon back.  Advised to call if any questions.

## 2017-06-10 NOTE — Telephone Encounter (Signed)
New Message    Patient is calling to see if whether or not he needs to keep the appointment on May 9. He recently had bypass surgery on just came home on yesterday.  His concern is whether or not he should wait until after he see Dr. Luan Pulling.

## 2017-06-13 DIAGNOSIS — Z48812 Encounter for surgical aftercare following surgery on the circulatory system: Secondary | ICD-10-CM | POA: Diagnosis not present

## 2017-06-13 DIAGNOSIS — I10 Essential (primary) hypertension: Secondary | ICD-10-CM | POA: Diagnosis not present

## 2017-06-13 DIAGNOSIS — I214 Non-ST elevation (NSTEMI) myocardial infarction: Secondary | ICD-10-CM | POA: Diagnosis not present

## 2017-06-13 DIAGNOSIS — D696 Thrombocytopenia, unspecified: Secondary | ICD-10-CM | POA: Diagnosis not present

## 2017-06-13 DIAGNOSIS — I471 Supraventricular tachycardia: Secondary | ICD-10-CM | POA: Diagnosis not present

## 2017-06-13 DIAGNOSIS — D649 Anemia, unspecified: Secondary | ICD-10-CM | POA: Diagnosis not present

## 2017-06-13 DIAGNOSIS — I2510 Atherosclerotic heart disease of native coronary artery without angina pectoris: Secondary | ICD-10-CM | POA: Diagnosis not present

## 2017-06-13 DIAGNOSIS — J449 Chronic obstructive pulmonary disease, unspecified: Secondary | ICD-10-CM | POA: Diagnosis not present

## 2017-06-13 DIAGNOSIS — I6521 Occlusion and stenosis of right carotid artery: Secondary | ICD-10-CM | POA: Diagnosis not present

## 2017-06-17 ENCOUNTER — Telehealth (HOSPITAL_COMMUNITY): Payer: Self-pay

## 2017-06-17 DIAGNOSIS — I2510 Atherosclerotic heart disease of native coronary artery without angina pectoris: Secondary | ICD-10-CM | POA: Diagnosis not present

## 2017-06-17 DIAGNOSIS — I10 Essential (primary) hypertension: Secondary | ICD-10-CM | POA: Diagnosis not present

## 2017-06-17 DIAGNOSIS — I6521 Occlusion and stenosis of right carotid artery: Secondary | ICD-10-CM | POA: Diagnosis not present

## 2017-06-17 DIAGNOSIS — I471 Supraventricular tachycardia: Secondary | ICD-10-CM | POA: Diagnosis not present

## 2017-06-17 DIAGNOSIS — D649 Anemia, unspecified: Secondary | ICD-10-CM | POA: Diagnosis not present

## 2017-06-17 DIAGNOSIS — J449 Chronic obstructive pulmonary disease, unspecified: Secondary | ICD-10-CM | POA: Diagnosis not present

## 2017-06-17 DIAGNOSIS — D696 Thrombocytopenia, unspecified: Secondary | ICD-10-CM | POA: Diagnosis not present

## 2017-06-17 DIAGNOSIS — Z48812 Encounter for surgical aftercare following surgery on the circulatory system: Secondary | ICD-10-CM | POA: Diagnosis not present

## 2017-06-17 DIAGNOSIS — I214 Non-ST elevation (NSTEMI) myocardial infarction: Secondary | ICD-10-CM | POA: Diagnosis not present

## 2017-06-17 NOTE — Telephone Encounter (Signed)
Called patient to see if he is interested in the Cardiac Rehab Program. Patient is interested. Explained scheduling process and went over insurance with patient, verbalized understanding. Will contact patient for scheduling once follow up appt has been completed.

## 2017-06-17 NOTE — Telephone Encounter (Signed)
Patients insurance is active and benefits verified through [facility] - $10.00 co-pay, no deductible, out of pocket amount of $3,400/$70.00 has been met, no co-insurance, and no pre-authorization is required. Passport/reference [phone]  Will contact patient to see if he is interested in the Cardiac Rehab Program. If interested, patient will need to complete follow up appt. Once completed, patient will be contacted for scheduling upon review by the RN Navigator.

## 2017-06-18 NOTE — Progress Notes (Signed)
Cardiology Office Note:    Date:  06/19/2017   ID:  Wayne Mitchell, DOB Jul 03, 1935, MRN [id number]  PCP:  Georgann Housekeeper, MD  Cardiologist:  Lesleigh Noe, MD  Referring MD: Georgann Housekeeper, MD   Chief Complaint  Patient presents with  . Follow-up    Post cabg    History of Present Illness:    Wayne Mitchell is a 82 y.o. male with a past medical history significant for hypertension, syncope (2007, 2015, 2017),carotid artery disease ( left ICA 60-79 percent), thrombocytopenia and recently diagnosed CAD s/p CABG 06/02/17.  He has a history of syncope in 2007, 2015, 2017.  Echocardiogram revealed normal LV function with mild-to-moderate aortic insufficiency.  Felt to be likely related to dehydration or vasovagal episode. He was noted to be bradycardic and his beta blocker was discontinued. A Holter monitor off the beta blocker revealed sinus rhythm with occasional episodes of junctional tachycardia and occasional brief 3 second pauses during sleep but no evidence of heart block and no severe bradycardia.  The patient was planned for an EGD due to increased chest burning over the last year but it was noted that his discomfort was radiating to both arms. He was noted to have exertional chest discomfort. He had no associated shortness of breath, nausea or diaphoresis. He had no prior CAD. He had been started on pantoprazole with no improvement. It was decided to arrange for cardiac cath which was done on 05/30/17 and revealed critical stenosis at the ostium of the LAD, severe stenosis of the first OM branch of the circumflex, patent RCA, mild segmental LV contraction abnormality with LVEF 50-55 percent. On 06/02/2017 he subsequently underwent three-vessel coronary artery bypass grafting using the LIMA to the LAD, SVG to diagonal and SVG to circumflex artery. He was found to have a right pneumothorax on 4/24 and a chest tube was placed. Echocardiogram on 06/05/17 postoperatively showed EF 60-65%,  moderate LVH, oncoordinate septal motion, grade 1 DD, trivial AI and MR.  Lopressor was started, but switched to low dose Coreg due to orthostatic hypotension. His amlodipine, HCTZ, irbesartan were discontinued due to orthostatic hypotenison. He was discharged home on 06/09/17 on aspirin 81 mg, atorvastatin 20 mg, carvedilol 3.125 mg, lasix 40 mg for 7 days. Wt was 158 at discharge.   He has a follow up with the CT surgeon on 07/14/17.  Wayne Mitchell is here today for post-op cardiology follow up with his wife.  His wife has noted an irregular pulse. Wayne Mitchell denies palpitations or any exertional chest tightness or shortness of breath. He has not had much surgical pain. He has had no orthopnea, PND or edema. He had some mild lightheadedness last week. He went and laid down and it resolved. He has been walking in the house but he started getting outside to walk yesterday to the end of his driveway and to the neighbor's driveway and he felt good.  He still has a poor appetite and is taking 2 naps per day. He plans on attending cardiac rehab.   An EKG done today showed NSR with PVC's, no afib. Hospital EKGs also showed PVC's. While in the office sitting on the table the patient felt lightheaded. He laid back on the table and felt better quickly. I checked orthostatic VS and they were normal.    Past Medical History:  Diagnosis Date  . Carotid artery disease (HCC)   . Colon polyp    small  . Coronary artery disease   .  Coronary artery disease involving native coronary artery of native heart with unstable angina pectoris (HCC) 05/30/2017   3V CABG 06/02/17  . HTN (hypertension)   . Hypothyroidism   . Syncope   . Syncope   . Thrombocytopenia (HCC)     Past Surgical History:  Procedure Laterality Date  . CATARACT EXTRACTION Left   . CORONARY ARTERY BYPASS GRAFT N/A 06/02/2017   Procedure: CORONARY ARTERY BYPASS GRAFTING (CABG) x3 Using Left Internal Mammary Artery and Right Leg Greater Saphenous Vein  harvested endoscopically;  Surgeon: Kerin Perna, MD;  Location: [facility] OR;  Service: Open Heart Surgery;  Laterality: N/A;  . ESOPHAGOGASTRODUODENOSCOPY N/A 10/23/2014   Procedure: ESOPHAGOGASTRODUODENOSCOPY (EGD);  Surgeon: Bernette Redbird, MD;  Location: [facility] ENDOSCOPY;  Service: Endoscopy;  Laterality: N/A;  . HERNIA REPAIR    . LEFT HEART CATH AND CORONARY ANGIOGRAPHY N/A 05/30/2017   Procedure: LEFT HEART CATH AND CORONARY ANGIOGRAPHY;  Surgeon: [name], MD;  Location: [facility] INVASIVE CV LAB;  Service: Cardiovascular;  Laterality: N/A;  . TEE WITHOUT CARDIOVERSION N/A 06/02/2017   Procedure: TRANSESOPHAGEAL ECHOCARDIOGRAM (TEE);  Surgeon: Donata Clay, Theron Arista, MD;  Location: [facility] OR;  Service: Open Heart Surgery;  Laterality: N/A;    Current Medications: Current Meds  Medication Sig  . acetaminophen (TYLENOL) 325 MG tablet Take 2 tablets (650 mg total) by mouth every 6 (six) hours as needed for mild pain.  Marland Kitchen aspirin EC 81 MG tablet Take 81 mg by mouth See admin instructions. Twice monthly  . atorvastatin (LIPITOR) 20 MG tablet Take 20 mg by mouth daily.  . carvedilol (COREG) 3.125 MG tablet Take 1 tablet (3.125 mg total) by mouth daily.  Marland Kitchen levothyroxine (SYNTHROID, LEVOTHROID) 112 MCG tablet Take 112 mcg by mouth daily before breakfast.  . pantoprazole (PROTONIX) 20 MG tablet Take 20 mg by mouth 2 (two) times daily.  . traMADol (ULTRAM) 50 MG tablet Take 1 tablet (50 mg total) by mouth every 4 (four) hours as needed for moderate pain.     Allergies:   Lisinopril   Social History   Socioeconomic History  . Marital status: Married    Spouse name: Not on file  . Number of children: Not on file  . Years of education: Not on file  . Highest education level: Not on file  Occupational History  . Occupation: retired  Engineer, production  . Financial resource strain: Not on file  . Food insecurity:    Worry: Not on file    Inability: Not on file  . Transportation needs:    Medical: Not on file      Non-medical: Not on file  Tobacco Use  . Smoking status: Never Smoker  . Smokeless tobacco: Never Used  Substance and Sexual Activity  . Alcohol use: Yes    Alcohol/week: 2.4 oz    Types: 4 Cans of beer per week  . Drug use: No  . Sexual activity: Not on file  Lifestyle  . Physical activity:    Days per week: Not on file    Minutes per session: Not on file  . Stress: Not on file  Relationships  . Social connections:    Talks on phone: Not on file    Gets together: Not on file    Attends religious service: Not on file    Active member of club or organization: Not on file    Attends meetings of clubs or organizations: Not on file    Relationship status: Not on file  Other  Topics Concern  . Not on file  Social History Narrative  . Not on file     Family History: The patient's family history includes Healthy in his brother; Pancreatic cancer in his mother. ROS:   Please see the history of present illness.     All other systems reviewed and are negative.  EKGs/Labs/Other Studies Reviewed:    The following studies were reviewed today:  Echocardiogram 06/05/17 Study Conclusions - Procedure narrative: Transthoracic echocardiography. Image   quality was poor. The study was technically difficult, as a   result of poor acoustic windows and poor sound wave transmission. - Left ventricle: The cavity size was normal. Wall thickness was   increased in a pattern of moderate LVH. Systolic function was   normal. The estimated ejection fraction was in the range of 60%   to 65%. Incoordinate septal motion. Doppler parameters are   consistent with abnormal left ventricular relaxation (grade 1   diastolic dysfunction). The E/e&' ratio is >15, suggesting   elevated LV filling pressure. - Aortic valve: Poorly visualized. There was trivial regurgitation. - Mitral valve: Mildly thickened leaflets . There was trivial   regurgitation. - Left atrium: The atrium was normal in  size.  Impressions: - Technically difficult study. LVEF 60-65%, moderate LVH,   incoordinate septal motion, grade 1 DD, elevated LV filling   pressure, trivial AI, trivial MR, normal LA size.  LEFT HEART CATH AND CORONARY ANGIOGRAPHY  05/30/17  Conclusion   1. Critical stenosis at the ostium of the LAD, large vessel, appears to be a suitable target for grafting 2. Severe stenosis of the first OM branch of the circumflex 3. Patent RCA, unable to selectively engage, suspected codominant vessel 4. Mild segmental LV contraction abnormality with LVEF estimated at 50-55%  Plan: IV heparin, TCTS consultation for consideration of CABG   Carotid dopplers 05/31/17 Right Carotid: Velocities in the right ICA are consistent with a 1-39% stenosis.        The ECA appears >50% stenosed. Left Carotid: Velocities in the left ICA are consistent with a 60-79% stenosis. Vertebrals: Bilateral vertebral arteries demonstrate antegrade flow.   EKG:  EKG is ordered today.  The ekg ordered today demonstrates NSR with frequent PVC's and non-specific intraventricular block. Similar to previous.   Recent Labs: 06/04/2017: TSH 5.209 06/07/2017: ALT 22 06/09/2017: BUN 16; Creatinine, Ser 1.25; Hemoglobin 9.3; Magnesium 1.7; Platelets 127; Potassium 2.7; Sodium 137   Recent Lipid Panel    Component Value Date/Time   CHOL 130 05/31/2017 0517   TRIG 63 05/31/2017 0517   HDL 56 05/31/2017 0517   CHOLHDL 2.3 05/31/2017 0517   VLDL 13 05/31/2017 0517   LDLCALC 61 05/31/2017 0517    Physical Exam:    VS:  BP 112/72   Pulse (!) 101   Ht 5\' 7"  (1.702 m)   Wt 156 lb (70.8 kg)   SpO2 95%   BMI 24.43 kg/m     Wt Readings from Last 3 Encounters:  06/19/17 156 lb (70.8 kg)  06/09/17 158 lb 15.2 oz (72.1 kg)  05/28/17 164 lb (74.4 kg)    BP supine: 110/64 BP sitting: 110/50 BP standing: 120/50   Physical Exam  Constitutional: He is oriented to person, place, and time. He appears well-developed  and well-nourished. No distress.  HENT:  Head: Normocephalic and atraumatic.  Neck: Normal range of motion. Neck supple. No JVD present.  Cardiovascular: Normal rate, normal heart sounds and intact distal pulses. Exam reveals no gallop and  no friction rub.  No murmur heard. Occ irregular beats  Pulmonary/Chest: Effort normal and breath sounds normal. No respiratory distress. He has no wheezes. He has no rales.  Abdominal: Soft. Bowel sounds are normal. He exhibits no distension. There is no tenderness.  Musculoskeletal: Normal range of motion. He exhibits no edema or deformity.  Neurological: He is alert and oriented to person, place, and time.  Skin: Skin is warm and dry.  Chest and leg incisions healing, no redness or drainage  Psychiatric: He has a normal mood and affect. His behavior is normal. Thought content normal.    ASSESSMENT:    1. Coronary artery disease involving native coronary artery of native heart without angina pectoris   2. PVC's (premature ventricular contractions)   3. Essential (primary) hypertension   4. Hyperlipidemia, unspecified hyperlipidemia type   5. Hypokalemia   6. Bilateral carotid artery stenosis    PLAN:    In order of problems listed above:  CAD S/P 3V CABG 06/02/17:   Incisions healing well. He is increasing his activity and feeling well. Wt is down from discharge wt 158 to 156 lbs today. On aspirin, statin, BB. Plans to do cardiac rehab phase 2.   PVC's: Asymptomatic. Will check BMet. I considered increasing his BB but he has had some lightheadedness incuding in hte office.   Hypertension:  (stopped amlodipine, HCTZ, irbesartan due to orthostatic hypotension in hospital) BP well controlled. Low dose carvedilol started with once daily dosing. He has had some occ lightheadedness including while in the office today. Improves with laying down. Orthostatics negative today. Will continue with current therapy, not restart any antihypertensives or increase  BB for now. Encouraged adequate oral intake. Encouraged safety with position changes and walking.   Hyperlipidemia: On atorvastatin 20 mg. LDL 56 on 05/31/17, at goal of <70. Continue current therapy.   Hypokalemia in hospital:  Check BMet  Carotid artery disease: left ICA 60-79% stenosis. Continue aspririn and statin.    Medication Adjustments/Labs and Tests Ordered: Current medicines are reviewed at length with the patient today.  Concerns regarding medicines are outlined above. Labs and tests ordered and medication changes are outlined in the patient instructions below:  Patient Instructions  Medication Instructions: Your physician recommends that you continue on your current medications as directed. Please refer to the Current Medication list given to you today.   Labwork: TODAY: BMET  Procedures/Testing: None Ordered  Follow-Up: Your physician recommends that you schedule a follow-up appointment on 08/12/17 at 8.00 AM with Berton Bon NP   Any Additional Special Instructions Will Be Listed Below (If Applicable).     If you need a refill on your cardiac medications before your next appointment, please call your pharmacy. '    Signed, Berton Bon, NP  06/19/2017 12:41 PM    [facility]

## 2017-06-19 ENCOUNTER — Ambulatory Visit: Payer: Medicare HMO | Admitting: Cardiology

## 2017-06-19 ENCOUNTER — Encounter: Payer: Self-pay | Admitting: Cardiology

## 2017-06-19 VITALS — BP 112/72 | HR 101 | Ht 67.0 in | Wt 156.0 lb

## 2017-06-19 DIAGNOSIS — I6523 Occlusion and stenosis of bilateral carotid arteries: Secondary | ICD-10-CM

## 2017-06-19 DIAGNOSIS — I493 Ventricular premature depolarization: Secondary | ICD-10-CM

## 2017-06-19 DIAGNOSIS — E876 Hypokalemia: Secondary | ICD-10-CM

## 2017-06-19 DIAGNOSIS — E785 Hyperlipidemia, unspecified: Secondary | ICD-10-CM

## 2017-06-19 DIAGNOSIS — I10 Essential (primary) hypertension: Secondary | ICD-10-CM

## 2017-06-19 DIAGNOSIS — I2510 Atherosclerotic heart disease of native coronary artery without angina pectoris: Secondary | ICD-10-CM

## 2017-06-19 LAB — BASIC METABOLIC PANEL
BUN/Creatinine Ratio: 12 (ref 10–24)
BUN: 14 mg/dL (ref 8–27)
CALCIUM: 9.1 mg/dL (ref 8.6–10.2)
CO2: 26 mmol/L (ref 20–29)
Chloride: 97 mmol/L (ref 96–106)
Creatinine, Ser: 1.14 mg/dL (ref 0.76–1.27)
GFR calc Af Amer: 70 mL/min/{1.73_m2} (ref >59)
GFR, EST NON AFRICAN AMERICAN: 61 mL/min/{1.73_m2} (ref >59)
GLUCOSE: 107 mg/dL — AB (ref 65–99)
POTASSIUM: 3.9 mmol/L (ref 3.5–5.2)
Sodium: 140 mmol/L (ref 134–144)

## 2017-06-19 NOTE — Patient Instructions (Addendum)
Medication Instructions: Your physician recommends that you continue on your current medications as directed. Please refer to the Current Medication list given to you today.   Labwork: TODAY: BMET  Procedures/Testing: None Ordered  Follow-Up: Your physician recommends that you schedule a follow-up appointment on 08/12/17 at 8.00 AM with Berton Bon NP   Any Additional Special Instructions Will Be Listed Below (If Applicable).     If you need a refill on your cardiac medications before your next appointment, please call your pharmacy. '

## 2017-06-24 ENCOUNTER — Telehealth (HOSPITAL_COMMUNITY): Payer: Self-pay

## 2017-06-24 NOTE — Telephone Encounter (Signed)
Called patient in regards to Cardiac Rehab - Scheduled orientation on 08/26/17 at 8:00am. Patient will attend the 8:15am exc class. Mailed packet.

## 2017-06-26 DIAGNOSIS — I471 Supraventricular tachycardia: Secondary | ICD-10-CM | POA: Diagnosis not present

## 2017-06-26 DIAGNOSIS — D649 Anemia, unspecified: Secondary | ICD-10-CM | POA: Diagnosis not present

## 2017-06-26 DIAGNOSIS — I2510 Atherosclerotic heart disease of native coronary artery without angina pectoris: Secondary | ICD-10-CM | POA: Diagnosis not present

## 2017-06-26 DIAGNOSIS — I6521 Occlusion and stenosis of right carotid artery: Secondary | ICD-10-CM | POA: Diagnosis not present

## 2017-06-26 DIAGNOSIS — J449 Chronic obstructive pulmonary disease, unspecified: Secondary | ICD-10-CM | POA: Diagnosis not present

## 2017-06-26 DIAGNOSIS — I214 Non-ST elevation (NSTEMI) myocardial infarction: Secondary | ICD-10-CM | POA: Diagnosis not present

## 2017-06-26 DIAGNOSIS — Z48812 Encounter for surgical aftercare following surgery on the circulatory system: Secondary | ICD-10-CM | POA: Diagnosis not present

## 2017-06-26 DIAGNOSIS — D696 Thrombocytopenia, unspecified: Secondary | ICD-10-CM | POA: Diagnosis not present

## 2017-06-26 DIAGNOSIS — I10 Essential (primary) hypertension: Secondary | ICD-10-CM | POA: Diagnosis not present

## 2017-07-03 DIAGNOSIS — I471 Supraventricular tachycardia: Secondary | ICD-10-CM | POA: Diagnosis not present

## 2017-07-03 DIAGNOSIS — J449 Chronic obstructive pulmonary disease, unspecified: Secondary | ICD-10-CM | POA: Diagnosis not present

## 2017-07-03 DIAGNOSIS — D696 Thrombocytopenia, unspecified: Secondary | ICD-10-CM | POA: Diagnosis not present

## 2017-07-03 DIAGNOSIS — I6521 Occlusion and stenosis of right carotid artery: Secondary | ICD-10-CM | POA: Diagnosis not present

## 2017-07-03 DIAGNOSIS — Z48812 Encounter for surgical aftercare following surgery on the circulatory system: Secondary | ICD-10-CM | POA: Diagnosis not present

## 2017-07-03 DIAGNOSIS — I2510 Atherosclerotic heart disease of native coronary artery without angina pectoris: Secondary | ICD-10-CM | POA: Diagnosis not present

## 2017-07-03 DIAGNOSIS — I214 Non-ST elevation (NSTEMI) myocardial infarction: Secondary | ICD-10-CM | POA: Diagnosis not present

## 2017-07-03 DIAGNOSIS — D649 Anemia, unspecified: Secondary | ICD-10-CM | POA: Diagnosis not present

## 2017-07-03 DIAGNOSIS — I10 Essential (primary) hypertension: Secondary | ICD-10-CM | POA: Diagnosis not present

## 2017-07-11 ENCOUNTER — Other Ambulatory Visit: Payer: Self-pay | Admitting: Cardiothoracic Surgery

## 2017-07-11 DIAGNOSIS — I25119 Atherosclerotic heart disease of native coronary artery with unspecified angina pectoris: Secondary | ICD-10-CM

## 2017-07-14 ENCOUNTER — Other Ambulatory Visit: Payer: Self-pay

## 2017-07-14 ENCOUNTER — Ambulatory Visit
Admission: RE | Admit: 2017-07-14 | Discharge: 2017-07-14 | Disposition: A | Discharge status: Home / Self Care | Payer: Medicare HMO | Source: Ambulatory Visit | Attending: Cardiothoracic Surgery | Admitting: Cardiothoracic Surgery

## 2017-07-14 ENCOUNTER — Ambulatory Visit (INDEPENDENT_AMBULATORY_CARE_PROVIDER_SITE_OTHER): Payer: Self-pay | Admitting: Physician Assistant

## 2017-07-14 VITALS — BP 149/87 | HR 88 | Resp 16 | Ht 67.0 in | Wt 156.0 lb

## 2017-07-14 DIAGNOSIS — I200 Unstable angina: Secondary | ICD-10-CM

## 2017-07-14 DIAGNOSIS — J9811 Atelectasis: Secondary | ICD-10-CM | POA: Diagnosis not present

## 2017-07-14 DIAGNOSIS — I25119 Atherosclerotic heart disease of native coronary artery with unspecified angina pectoris: Secondary | ICD-10-CM

## 2017-07-14 DIAGNOSIS — Z951 Presence of aortocoronary bypass graft: Secondary | ICD-10-CM

## 2017-07-14 DIAGNOSIS — J90 Pleural effusion, not elsewhere classified: Secondary | ICD-10-CM | POA: Diagnosis not present

## 2017-07-14 NOTE — Progress Notes (Signed)
HPI: Patient returns for routine postoperative follow-up having undergone CABG x 3 on 06/02/2017.  The patient's early postoperative recovery while in the hospital was notable for orthostatic hypotension and his antihypertensive agents were stopped.  Since hospital discharge the patient reports the patient states he has good and bad days.  He is ambulating without difficulty.  He has issues sleeping with at night and naps twice a day.  He continues to feel lightheaded at times.  He appetite is slowly improving.  He has registered for cardiac rehab but is unable to start for several weeks yet due to unavailability.  He questions when he can increase his activity.   Current Outpatient Medications  Medication Sig Dispense Refill  . acetaminophen (TYLENOL) 325 MG tablet Take 2 tablets (650 mg total) by mouth every 6 (six) hours as needed for mild pain.    Marland Kitchen aspirin EC 81 MG tablet Take 81 mg by mouth See admin instructions. Twice monthly    . atorvastatin (LIPITOR) 20 MG tablet Take 20 mg by mouth daily.    . carvedilol (COREG) 3.125 MG tablet Take 1 tablet (3.125 mg total) by mouth daily. 60 tablet 3  . levothyroxine (SYNTHROID, LEVOTHROID) 112 MCG tablet Take 112 mcg by mouth daily before breakfast.    . pantoprazole (PROTONIX) 20 MG tablet Take 20 mg by mouth 2 (two) times daily.     No current facility-administered medications for this visit.     Physical Exam:  BP (!) 149/87 (BP Location: Right Arm, Patient Position: Sitting, Cuff Size: Normal)   Pulse 88   Resp 16   Ht 5\' 7"  (1.702 m)   Wt 156 lb (70.8 kg)   SpO2 98% Comment: ON RA  BMI 24.43 kg/m   Gen; no apparent distress Heart: RRR Lungs: mildly diminished bibasilar Abd; soft non-tender, non-distended Ext: mild edema Incisions; well healed  Diagnostic Tests:  CXR: elevated diaphragm on left with small left effusion   A/P;  1. S/p CABG x 3 doing well-he is mildly hypertensive today, however with episodic dizziness will  monitor and defer to Cardiology to adjust medications 2. Elevated diaphragm on the left, minimal effusion.. No intervention at this time  3. Activity- may resume driving.  He is 6 weeks out from surgery can resume normal activity with continued weight restrictions of 10-15 lbs.  He was encouraged to continue increasing walking distance as tolerated 3. RTC with Dr. Donata Clay in 6 months   Lowella Dandy, PA-C [facility] [phone]

## 2017-08-06 ENCOUNTER — Telehealth (HOSPITAL_COMMUNITY): Payer: Self-pay | Admitting: Pharmacy Technician

## 2017-08-07 NOTE — Telephone Encounter (Signed)
Cardiac Rehab Medication Review by a Pharmacist  Does the patient  feel that his/her medications are working for him/her?  yes  Has the patient been experiencing any side effects to the medications prescribed?  no  Does the patient measure his/her own blood pressure or blood glucose at home?  yes   Does the patient have any problems obtaining medications due to transportation or finances?   no  Understanding of regimen: excellent Understanding of indications: excellent Potential of compliance: excellent    Pharmacist comments: Pt endorses no medication issues at this time, and check his BP at home somewhat regularly stating that at times he records SBP>150 and other times SBP 110s.  He also describes some weight loss since his procedure.  He endorses nocturia since his procedure, and discussed his plan to bring this up with his cardiology visit, and/or next PCP visit.    Dion Body 08/07/2017 3:21 PM

## 2017-08-08 ENCOUNTER — Encounter: Payer: Self-pay | Admitting: Cardiology

## 2017-08-11 ENCOUNTER — Telehealth (HOSPITAL_COMMUNITY): Payer: Self-pay | Admitting: Pharmacy Technician

## 2017-08-12 ENCOUNTER — Encounter (HOSPITAL_COMMUNITY): Payer: Self-pay

## 2017-08-12 ENCOUNTER — Ambulatory Visit: Payer: Medicare HMO | Admitting: Cardiology

## 2017-08-12 ENCOUNTER — Encounter (HOSPITAL_COMMUNITY)
Admission: RE | Admit: 2017-08-12 | Discharge: 2017-08-12 | Disposition: A | Discharge status: Home / Self Care | Payer: Medicare HMO | Source: Ambulatory Visit | Attending: Interventional Cardiology | Admitting: Interventional Cardiology

## 2017-08-12 VITALS — Ht 67.0 in | Wt 149.0 lb

## 2017-08-12 DIAGNOSIS — Z951 Presence of aortocoronary bypass graft: Secondary | ICD-10-CM | POA: Insufficient documentation

## 2017-08-12 DIAGNOSIS — Z7982 Long term (current) use of aspirin: Secondary | ICD-10-CM | POA: Insufficient documentation

## 2017-08-12 DIAGNOSIS — Z79890 Hormone replacement therapy (postmenopausal): Secondary | ICD-10-CM | POA: Insufficient documentation

## 2017-08-12 DIAGNOSIS — I10 Essential (primary) hypertension: Secondary | ICD-10-CM | POA: Insufficient documentation

## 2017-08-12 DIAGNOSIS — I2510 Atherosclerotic heart disease of native coronary artery without angina pectoris: Secondary | ICD-10-CM | POA: Insufficient documentation

## 2017-08-12 DIAGNOSIS — D696 Thrombocytopenia, unspecified: Secondary | ICD-10-CM | POA: Insufficient documentation

## 2017-08-12 DIAGNOSIS — E039 Hypothyroidism, unspecified: Secondary | ICD-10-CM | POA: Insufficient documentation

## 2017-08-12 DIAGNOSIS — Z79899 Other long term (current) drug therapy: Secondary | ICD-10-CM | POA: Insufficient documentation

## 2017-08-12 DIAGNOSIS — Z7984 Long term (current) use of oral hypoglycemic drugs: Secondary | ICD-10-CM | POA: Insufficient documentation

## 2017-08-12 NOTE — Progress Notes (Signed)
Wayne Mitchell 82 y.o. male DOB: August 19, 1935 MRN: [id number]      Nutrition Note  1. 06/02/17 S/P CABG x 3    Past Medical History:  Diagnosis Date  . Carotid artery disease (HCC)   . Colon polyp    small  . Coronary artery disease   . Coronary artery disease involving native coronary artery of native heart with unstable angina pectoris (HCC) 05/30/2017   3V CABG 06/02/17  . HTN (hypertension)   . Hypothyroidism   . Syncope   . Syncope   . Thrombocytopenia (HCC)    Meds reviewed. Lipitor, coreg noted  HT: Ht Readings from Last 1 Encounters:  08/12/17 5\' 7"  (1.702 m)    WT: Wt Readings from Last 5 Encounters:  08/12/17 149 lb 0.5 oz (67.6 kg)  07/14/17 156 lb (70.8 kg)  06/19/17 156 lb (70.8 kg)  06/09/17 158 lb 15.2 oz (72.1 kg)  05/28/17 164 lb (74.4 kg)     Body mass index is 23.34 kg/m.   Current tobacco use? no  Labs:  Lipid Panel     Component Value Date/Time   CHOL 130 05/31/2017 0517   TRIG 63 05/31/2017 0517   HDL 56 05/31/2017 0517   CHOLHDL 2.3 05/31/2017 0517   VLDL 13 05/31/2017 0517   LDLCALC 61 05/31/2017 0517    Lab Results  Component Value Date   HGBA1C 5.6 05/31/2017   CBG (last 3)  No results for input(s): GLUCAP in the last 72 hours.  Nutrition Note Spoke with pt. Nutrition plan and goals reviewed with pt. Pt is following Step 2 of the Therapeutic Lifestyle Changes diet. Pt wants to maintain wt. Wt maintenance tips reviewed with pt. Pt reports in the last few years being told has pre-diabetes. Last A1c 5.6, will focus on carbohydrate consistent heart healthy meal plan. Per discussion, pt does not use canned/convenience foods often. Pt rarely adds salt to food. Pt eats out infrequently. Pt shared that his wife does all of the cooking. While he follows a heart healthy diet may need reinforcement to understand what that entails beyond what his wife makes for him. Pt expressed understanding of the information reviewed. Pt aware of nutrition  education classes offered and plans on attending nutrition classes.  Nutrition Diagnosis ? Food-and nutrition-related knowledge deficit related to lack of exposure to information as related to diagnosis of: ? CVD ? Pre-DM  Nutrition Intervention ? Pt's individual nutrition plan and goals reviewed with pt.  Nutrition Goal(s):   ? Pt to identify and limit food sources of saturated fat, trans fat, and sodium ? Pt able to name foods that affect blood glucose   Plan:  Pt to attend nutrition classes ? Nutrition I ? Nutrition II ? Portion Distortion  ? Diabetes Blitz ? Diabetes Q & A Will provide client-centered nutrition education as part of interdisciplinary care.   Monitor and evaluate progress toward nutrition goal with team.  Ross Marcus, MS, RD, LDN 08/12/2017 1:22 PM

## 2017-08-12 NOTE — Progress Notes (Signed)
Cardiac Individual Treatment Plan  Patient Details  Name: Wayne Mitchell MRN: [id number] Date of Birth: May 31, 1935 Referring Provider:   Flowsheet Row CARDIAC REHAB PHASE II ORIENTATION from 08/12/2017 in [facility] CARDIAC REHAB  Referring Provider  Verdis Prime MD      Initial Encounter Date:  Flowsheet Row CARDIAC REHAB PHASE II ORIENTATION from 08/12/2017 in [facility] CARDIAC REHAB  Date  08/12/17      Visit Diagnosis: 06/02/17 S/P CABG x 3  Patient's Home Medications on Admission:  Current Outpatient Medications:  .  acetaminophen (TYLENOL) 325 MG tablet, Take 2 tablets (650 mg total) by mouth every 6 (six) hours as needed for mild pain., Disp: , Rfl:  .  aspirin EC 81 MG tablet, Take 81 mg by mouth See admin instructions. Twice monthly, Disp: , Rfl:  .  atorvastatin (LIPITOR) 20 MG tablet, Take 20 mg by mouth daily., Disp: , Rfl:  .  carvedilol (COREG) 3.125 MG tablet, Take 1 tablet (3.125 mg total) by mouth daily., Disp: 60 tablet, Rfl: 3 .  levothyroxine (SYNTHROID, LEVOTHROID) 112 MCG tablet, Take 112 mcg by mouth daily before breakfast., Disp: , Rfl:  .  pantoprazole (PROTONIX) 20 MG tablet, Take 20 mg by mouth 2 (two) times daily., Disp: , Rfl:   Past Medical History: Past Medical History:  Diagnosis Date  . Carotid artery disease (HCC)   . Colon polyp    small  . Coronary artery disease   . Coronary artery disease involving native coronary artery of native heart with unstable angina pectoris (HCC) 05/30/2017   3V CABG 06/02/17  . HTN (hypertension)   . Hypothyroidism   . Syncope   . Syncope   . Thrombocytopenia (HCC)     Tobacco Use: Social History   Tobacco Use  Smoking Status Never Smoker  Smokeless Tobacco Never Used    Labs: Recent Review Flowsheet Data    Labs for ITP Cardiac and Pulmonary Rehab Latest Ref Rng & Units 06/04/2017 06/05/2017 06/05/2017 06/05/2017 06/06/2017   Cholestrol 0 - 200 mg/dL - - - - -   LDLCALC  0 - 99 mg/dL - - - - -   HDL >40 mg/dL - - - - -   Trlycerides <150 mg/dL - - - - -   Hemoglobin A1c 4.8 - 5.6 % - - - - -   PHART 7.350 - 7.450 - - 7.436 - -   PCO2ART 32.0 - 48.0 mmHg - - 34.8 - -   HCO3 20.0 - 28.0 mmol/L - - 23.5 - -   TCO2 22 - 32 mmol/L - - 25 26 -   ACIDBASEDEF 0.0 - 2.0 mmol/L - - 1.0 - -   O2SAT % 46.1 39.8 97.0 - 54.8      Capillary Blood Glucose: Lab Results  Component Value Date   GLUCAP 125 (H) 06/08/2017   GLUCAP 118 (H) 06/08/2017   GLUCAP 106 (H) 06/07/2017   GLUCAP 126 (H) 06/07/2017   GLUCAP 113 (H) 06/07/2017     Exercise Target Goals: Date: 08/12/17  Exercise Program Goal: Individual exercise prescription set using results from initial 6 min walk test and THRR while considering  patient's activity barriers and safety.   Exercise Prescription Goal: Initial exercise prescription builds to 30-45 minutes a day of aerobic activity, 2-3 days per week.  Home exercise guidelines will be given to patient during program as part of exercise prescription that the participant will acknowledge.  Activity Barriers &  Risk Stratification: Activity Barriers & Cardiac Risk Stratification - 08/12/17 1055    Activity Barriers & Cardiac Risk Stratification          Activity Barriers  Deconditioning;Muscular Weakness    Cardiac Risk Stratification  High           6 Minute Walk: 6 Minute Walk    6 Minute Walk    Row Name 08/12/17 1054   Phase  Initial   Distance  1456 feet   Walk Time  6 minutes   # of Rest Breaks  0   MPH  2.76   METS  3.09   RPE  10   VO2 Peak  10.83   Symptoms  No   Resting HR  99 bpm   Resting BP  112/62   Resting Oxygen Saturation   98 %   Exercise Oxygen Saturation  during 6 min walk  98 %   Max Ex. HR  115 bpm   Max Ex. BP  134/78   2 Minute Post BP  130/70          Oxygen Initial Assessment:   Oxygen Re-Evaluation:   Oxygen Discharge (Final Oxygen Re-Evaluation):   Initial Exercise  Prescription: Initial Exercise Prescription - 08/12/17 1000    Date of Initial Exercise RX and Referring Provider          Date  08/12/17    Referring Provider  Verdis Prime MD    Expected Discharge Date  11/12/17        Bike          Level  0.5    Minutes  10    METs  2.4        NuStep          Level  3    SPM  70    Minutes  10    METs  2        Track          Laps  10    Minutes  10    METs  2.74        Prescription Details          Frequency (times per week)  3    Duration  Progress to 30 minutes of continuous aerobic without signs/symptoms of physical distress        Intensity          THRR 40-80% of Max Heartrate  56-113    Ratings of Perceived Exertion  11-13    Perceived Dyspnea  0-4        Progression          Progression  Continue to progress workloads to maintain intensity without signs/symptoms of physical distress.        Resistance Training          Training Prescription  Yes    Weight  2lbs    Reps  10-15           Perform Capillary Blood Glucose checks as needed.  Exercise Prescription Changes:   Exercise Comments:   Exercise Goals and Review: Exercise Goals    Exercise Goals    Row Name 08/12/17 0935   Increase Physical Activity  Yes   Intervention  Provide advice, education, support and counseling about physical activity/exercise needs.;Develop an individualized exercise prescription for aerobic and resistive training based on initial evaluation findings, risk stratification, comorbidities and participant's personal goals.   Expected Outcomes  Short Term: Attend rehab  on a regular basis to increase amount of physical activity.;Long Term: Add in home exercise to make exercise part of routine and to increase amount of physical activity.;Long Term: Exercising regularly at least 3-5 days a week.   Increase Strength and Stamina  Yes   Intervention  Provide advice, education, support and counseling about physical activity/exercise  needs.;Develop an individualized exercise prescription for aerobic and resistive training based on initial evaluation findings, risk stratification, comorbidities and participant's personal goals.   Expected Outcomes  Short Term: Increase workloads from initial exercise prescription for resistance, speed, and METs.;Short Term: Perform resistance training exercises routinely during rehab and add in resistance training at home;Long Term: Improve cardiorespiratory fitness, muscular endurance and strength as measured by increased METs and functional capacity ( )   Able to understand and use rate of perceived exertion (RPE) scale  Yes   Intervention  Provide education and explanation on how to use RPE scale   Expected Outcomes  Long Term:  Able to use RPE to guide intensity level when exercising independently;Short Term: Able to use RPE daily in rehab to express subjective intensity level   Able to understand and use Dyspnea scale  Yes   Intervention  Provide education and explanation on how to use Dyspnea scale   Expected Outcomes  Short Term: Able to use Dyspnea scale daily in rehab to express subjective sense of shortness of breath during exertion;Long Term: Able to use Dyspnea scale to guide intensity level when exercising independently   Knowledge and understanding of Target Heart Rate Range (THRR)  Yes   Intervention  Provide education and explanation of THRR including how the numbers were predicted and where they are located for reference   Expected Outcomes  Short Term: Able to state/look up THRR;Long Term: Able to use THRR to govern intensity when exercising independently;Short Term: Able to use daily as guideline for intensity in rehab   Able to check pulse independently  Yes   Intervention  Provide education and demonstration on how to check pulse in carotid and radial arteries.;Review the importance of being able to check your own pulse for safety during independent exercise   Expected  Outcomes  Short Term: Able to explain why pulse checking is important during independent exercise;Long Term: Able to check pulse independently and accurately   Understanding of Exercise Prescription  Yes   Intervention  Provide education, explanation, and written materials on patient's individual exercise prescription   Expected Outcomes  Short Term: Able to explain program exercise prescription;Long Term: Able to explain home exercise prescription to exercise independently          Exercise Goals Re-Evaluation :    Discharge Exercise Prescription (Final Exercise Prescription Changes):   Nutrition:  Target Goals: Understanding of nutrition guidelines, daily intake of sodium 1500mg , cholesterol 200mg , calories 30% from fat and 7% or less from saturated fats, daily to have 5 or more servings of fruits and vegetables.  Biometrics: Pre Biometrics - 08/12/17 1058    Pre Biometrics          Height  5\' 7"  (1.702 m)    Weight  149 lb 0.5 oz (67.6 kg)    Waist Circumference  33 inches    Hip Circumference  38.25 inches    Waist to Hip Ratio  0.86 %    BMI (Calculated)  23.34    Triceps Skinfold  23 mm    % Body Fat  24.9 %    Grip Strength  29 kg  Flexibility  0 in    Single Leg Stand  6.35 seconds            Nutrition Therapy Plan and Nutrition Goals:   Nutrition Assessments:   Nutrition Goals Re-Evaluation:   Nutrition Goals Re-Evaluation:   Nutrition Goals Discharge (Final Nutrition Goals Re-Evaluation):   Psychosocial: Target Goals: Acknowledge presence or absence of significant depression and/or stress, maximize coping skills, provide positive support system. Participant is able to verbalize types and ability to use techniques and skills needed for reducing stress and depression.  Initial Review & Psychosocial Screening: Initial Psych Review & Screening - 08/12/17 1006    Initial Review          Current issues with  None Identified        Family  Dynamics          Good Support System?  Yes spouse        Barriers          Psychosocial barriers to participate in program  There are no identifiable barriers or psychosocial needs.        Screening Interventions          Interventions  Encouraged to exercise           Quality of Life Scores: Quality of Life - 08/12/17 1006    Quality of Life          Select  Quality of Life        Quality of Life Scores          Health/Function Pre  26.2 %    Socioeconomic Pre  30 %    Psych/Spiritual Pre  29.14 %    Family Pre  30 %    GLOBAL Pre  28.21 %          Scores of 19 and below usually indicate a poorer quality of life in these areas.  A difference of  2-3 points is a clinically meaningful difference.  A difference of 2-3 points in the total score of the Quality of Life Index has been associated with significant improvement in overall quality of life, self-image, physical symptoms, and general health in studies assessing change in quality of life.  PHQ-9: Recent Review Flowsheet Data    There is no flowsheet data to display.     Interpretation of Total Score  Total Score Depression Severity:  1-4 = Minimal depression, 5-9 = Mild depression, 10-14 = Moderate depression, 15-19 = Moderately severe depression, 20-27 = Severe depression   Psychosocial Evaluation and Intervention:   Psychosocial Re-Evaluation:   Psychosocial Discharge (Final Psychosocial Re-Evaluation):   Vocational Rehabilitation: Provide vocational rehab assistance to qualifying candidates.   Vocational Rehab Evaluation & Intervention: Vocational Rehab - 08/12/17 1006    Initial Vocational Rehab Evaluation & Intervention          Assessment shows need for Vocational Rehabilitation  No retired            Education: Education Goals: Education classes will be provided on a weekly basis, covering required topics. Participant will state understanding/return demonstration of topics  presented.  Learning Barriers/Preferences: Learning Barriers/Preferences - 08/12/17 0935    Learning Barriers/Preferences          Learning Barriers  Sight    Learning Preferences  Written Material           Education Topics: Count Your Pulse:  -Group instruction provided by verbal instruction, demonstration, patient participation and written materials to support subject.  Instructors address importance of being able to find your pulse and how to count your pulse when at home without a heart monitor.  Patients get hands on experience counting their pulse with staff help and individually.   Heart Attack, Angina, and Risk Factor Modification:  -Group instruction provided by verbal instruction, video, and written materials to support subject.  Instructors address signs and symptoms of angina and heart attacks.    Also discuss risk factors for heart disease and how to make changes to improve heart health risk factors.   Functional Fitness:  -Group instruction provided by verbal instruction, demonstration, patient participation, and written materials to support subject.  Instructors address safety measures for doing things around the house.  Discuss how to get up and down off the floor, how to pick things up properly, how to safely get out of a chair without assistance, and balance training.   Meditation and Mindfulness:  -Group instruction provided by verbal instruction, patient participation, and written materials to support subject.  Instructor addresses importance of mindfulness and meditation practice to help reduce stress and improve awareness.  Instructor also leads participants through a meditation exercise.    Stretching for Flexibility and Mobility:  -Group instruction provided by verbal instruction, patient participation, and written materials to support subject.  Instructors lead participants through series of stretches that are designed to increase flexibility thus improving  mobility.  These stretches are additional exercise for major muscle groups that are typically performed during regular warm up and cool down.   Hands Only CPR:  -Group verbal, video, and participation provides a basic overview of AHA guidelines for community CPR. Role-play of emergencies allow participants the opportunity to practice calling for help and chest compression technique with discussion of AED use.   Hypertension: -Group verbal and written instruction that provides a basic overview of hypertension including the most recent diagnostic guidelines, risk factor reduction with self-care instructions and medication management.    Nutrition I class: Heart Healthy Eating:  -Group instruction provided by PowerPoint slides, verbal discussion, and written materials to support subject matter. The instructor gives an explanation and review of the Therapeutic Lifestyle Changes diet recommendations, which includes a discussion on lipid goals, dietary fat, sodium, fiber, plant stanol/sterol esters, sugar, and the components of a well-balanced, healthy diet.   Nutrition II class: Lifestyle Skills:  -Group instruction provided by PowerPoint slides, verbal discussion, and written materials to support subject matter. The instructor gives an explanation and review of label reading, grocery shopping for heart health, heart healthy recipe modifications, and ways to make healthier choices when eating out.   Diabetes Question & Answer:  -Group instruction provided by PowerPoint slides, verbal discussion, and written materials to support subject matter. The instructor gives an explanation and review of diabetes co-morbidities, pre- and post-prandial blood glucose goals, pre-exercise blood glucose goals, signs, symptoms, and treatment of hypoglycemia and hyperglycemia, and foot care basics.   Diabetes Blitz:  -Group instruction provided by PowerPoint slides, verbal discussion, and written materials to  support subject matter. The instructor gives an explanation and review of the physiology behind type 1 and type 2 diabetes, diabetes medications and rational behind using different medications, pre- and post-prandial blood glucose recommendations and Hemoglobin A1c goals, diabetes diet, and exercise including blood glucose guidelines for exercising safely.    Portion Distortion:  -Group instruction provided by PowerPoint slides, verbal discussion, written materials, and food models to support subject matter. The instructor gives an explanation of serving size versus portion size,  changes in portions sizes over the last 20 years, and what consists of a serving from each food group.   Stress Management:  -Group instruction provided by verbal instruction, video, and written materials to support subject matter.  Instructors review role of stress in heart disease and how to cope with stress positively.     Exercising on Your Own:  -Group instruction provided by verbal instruction, power point, and written materials to support subject.  Instructors discuss benefits of exercise, components of exercise, frequency and intensity of exercise, and end points for exercise.  Also discuss use of nitroglycerin and activating EMS.  Review options of places to exercise outside of rehab.  Review guidelines for sex with heart disease.   Cardiac Drugs I:  -Group instruction provided by verbal instruction and written materials to support subject.  Instructor reviews cardiac drug classes: antiplatelets, anticoagulants, beta blockers, and statins.  Instructor discusses reasons, side effects, and lifestyle considerations for each drug class.   Cardiac Drugs II:  -Group instruction provided by verbal instruction and written materials to support subject.  Instructor reviews cardiac drug classes: angiotensin converting enzyme inhibitors (ACE-I), angiotensin II receptor blockers (ARBs), nitrates, and calcium channel  blockers.  Instructor discusses reasons, side effects, and lifestyle considerations for each drug class.   Anatomy and Physiology of the Circulatory System:  Group verbal and written instruction and models provide basic cardiac anatomy and physiology, with the coronary electrical and arterial systems. Review of: AMI, Angina, Valve disease, Heart Failure, Peripheral Artery Disease, Cardiac Arrhythmia, Pacemakers, and the ICD.   Other Education:  -Group or individual verbal, written, or video instructions that support the educational goals of the cardiac rehab program.   Holiday Eating Survival Tips:  -Group instruction provided by PowerPoint slides, verbal discussion, and written materials to support subject matter. The instructor gives patients tips, tricks, and techniques to help them not only survive but enjoy the holidays despite the onslaught of food that accompanies the holidays.   Knowledge Questionnaire Score: Knowledge Questionnaire Score - 08/12/17 1006    Knowledge Questionnaire Score          Pre Score  20/24           Core Components/Risk Factors/Patient Goals at Admission: Personal Goals and Risk Factors at Admission - 08/12/17 0936    Core Components/Risk Factors/Patient Goals on Admission           Weight Management  Yes;Weight Maintenance    Intervention  Weight Management: Develop a combined nutrition and exercise program designed to reach desired caloric intake, while maintaining appropriate intake of nutrient and fiber, sodium and fats, and appropriate energy expenditure required for the weight goal.;Weight Management: Provide education and appropriate resources to help participant work on and attain dietary goals.    Admit Weight  149 lb 0.5 oz (67.6 kg)    Goal Weight: Short Term  149 lb (67.6 kg)    Goal Weight: Long Term  149 lb (67.6 kg)    Expected Outcomes  Short Term: Continue to assess and modify interventions until short term weight is achieved;Long  Term: Adherence to nutrition and physical activity/exercise program aimed toward attainment of established weight goal;Weight Maintenance: Understanding of the daily nutrition guidelines, which includes 25-35% calories from fat, 7% or less cal from saturated fats, less than 200mg  cholesterol, less than 1.5gm of sodium, & 5 or more servings of fruits and vegetables daily;Understanding recommendations for meals to include 15-35% energy as protein, 25-35% energy from fat, 35-60% energy from  carbohydrates, less than 200mg  of dietary cholesterol, 20-35 gm of total fiber daily;Understanding of distribution of calorie intake throughout the day with the consumption of 4-5 meals/snacks    Hypertension  Yes    Intervention  Provide education on lifestyle modifcations including regular physical activity/exercise, weight management, moderate sodium restriction and increased consumption of fresh fruit, vegetables, and low fat dairy, alcohol moderation, and smoking cessation.;Monitor prescription use compliance.    Expected Outcomes  Short Term: Continued assessment and intervention until BP is < 140/82mm HG in hypertensive participants. < 130/13mm HG in hypertensive participants with diabetes, heart failure or chronic kidney disease.;Long Term: Maintenance of blood pressure at goal levels.    Lipids  Yes    Intervention  Provide education and support for participant on nutrition & aerobic/resistive exercise along with prescribed medications to achieve LDL 70mg , HDL >40mg .    Expected Outcomes  Short Term: Participant states understanding of desired cholesterol values and is compliant with medications prescribed. Participant is following exercise prescription and nutrition guidelines.;Long Term: Cholesterol controlled with medications as prescribed, with individualized exercise RX and with personalized nutrition plan. Value goals: LDL < 70mg , HDL > 40 mg.           Core Components/Risk Factors/Patient Goals Review:     Core Components/Risk Factors/Patient Goals at Discharge (Final Review):    ITP Comments: ITP Comments    Row Name 08/12/17 0934   ITP Comments  Dr. Armanda Magic, Medical Director      Comments: Patient attended orientation from [phone] to [phone] to review rules and guidelines for program. Completed 6 minute walk test, Intitial ITP, and exercise prescription.  VSS. Telemetry-sinus rhythm, freq PACs,   Asymptomatic. Deveron Furlong, RN, BSN Cardiac Pulmonary Rehab 08/12/17 11:28 AM

## 2017-08-18 ENCOUNTER — Encounter: Payer: Self-pay | Admitting: Cardiology

## 2017-08-18 ENCOUNTER — Ambulatory Visit: Payer: Medicare HMO | Admitting: Cardiology

## 2017-08-18 VITALS — BP 112/60 | HR 86 | Ht 67.0 in | Wt 148.4 lb

## 2017-08-18 DIAGNOSIS — I10 Essential (primary) hypertension: Secondary | ICD-10-CM

## 2017-08-18 DIAGNOSIS — I493 Ventricular premature depolarization: Secondary | ICD-10-CM

## 2017-08-18 DIAGNOSIS — I2510 Atherosclerotic heart disease of native coronary artery without angina pectoris: Secondary | ICD-10-CM

## 2017-08-18 DIAGNOSIS — E785 Hyperlipidemia, unspecified: Secondary | ICD-10-CM

## 2017-08-18 DIAGNOSIS — I6523 Occlusion and stenosis of bilateral carotid arteries: Secondary | ICD-10-CM

## 2017-08-18 MED ORDER — CARVEDILOL 3.125 MG PO TABS: 3.1250 mg | ORAL_TABLET | Freq: Every day | ORAL | 3 refills | Status: DC

## 2017-08-18 NOTE — Patient Instructions (Signed)
Medication Instructions: Your physician recommends that you continue on your current medications as directed. Please refer to the Current Medication list given to you today.   Labwork: None  Procedures/Testing: None  Follow-Up: Your physician recommends that you schedule a follow-up appointment in: 3-4 months with Dr.Smith    Any Additional Special Instructions Will Be Listed Below (If Applicable).   DASH Eating Plan DASH stands for "Dietary Approaches to Stop Hypertension." The DASH eating plan is a healthy eating plan that has been shown to reduce high blood pressure (hypertension). It may also reduce your risk for type 2 diabetes, heart disease, and stroke. The DASH eating plan may also help with weight loss. What are tips for following this plan? General guidelines  Avoid eating more than 2,300 mg (milligrams) of salt (sodium) a day. If you have hypertension, you may need to reduce your sodium intake to 1,500 mg a day.  Limit alcohol intake to no more than 1 drink a day for nonpregnant women and 2 drinks a day for men. One drink equals 12 oz of beer, 5 oz of wine, or 1 oz of hard liquor.  Work with your health care provider to maintain a healthy body weight or to lose weight. Ask what an ideal weight is for you.  Get at least 30 minutes of exercise that causes your heart to beat faster (aerobic exercise) most days of the week. Activities may include walking, swimming, or biking.  Work with your health care provider or diet and nutrition specialist (dietitian) to adjust your eating plan to your individual calorie needs. Reading food labels  Check food labels for the amount of sodium per serving. Choose foods with less than 5 percent of the Daily Value of sodium. Generally, foods with less than 300 mg of sodium per serving fit into this eating plan.  To find whole grains, look for the word "whole" as the first word in the ingredient list. Shopping  Buy products labeled as  "low-sodium" or "no salt added."  Buy fresh foods. Avoid canned foods and premade or frozen meals. Cooking  Avoid adding salt when cooking. Use salt-free seasonings or herbs instead of table salt or sea salt. Check with your health care provider or pharmacist before using salt substitutes.  Do not fry foods. Cook foods using healthy methods such as baking, boiling, grilling, and broiling instead.  Cook with heart-healthy oils, such as olive, canola, soybean, or sunflower oil. Meal planning   Eat a balanced diet that includes: ? 5 or more servings of fruits and vegetables each day. At each meal, try to fill half of your plate with fruits and vegetables. ? Up to 6-8 servings of whole grains each day. ? Less than 6 oz of lean meat, poultry, or fish each day. A 3-oz serving of meat is about the same size as a deck of cards. One egg equals 1 oz. ? 2 servings of low-fat dairy each day. ? A serving of nuts, seeds, or beans 5 times each week. ? Heart-healthy fats. Healthy fats called Omega-3 fatty acids are found in foods such as flaxseeds and coldwater fish, like sardines, salmon, and mackerel.  Limit how much you eat of the following: ? Canned or prepackaged foods. ? Food that is high in trans fat, such as fried foods. ? Food that is high in saturated fat, such as fatty meat. ? Sweets, desserts, sugary drinks, and other foods with added sugar. ? Full-fat dairy products.  Do not salt foods before  eating.  Try to eat at least 2 vegetarian meals each week.  Eat more home-cooked food and less restaurant, buffet, and fast food.  When eating at a restaurant, ask that your food be prepared with less salt or no salt, if possible. What foods are recommended? The items listed may not be a complete list. Talk with your dietitian about what dietary choices are best for you. Grains Whole-grain or whole-wheat bread. Whole-grain or whole-wheat pasta. Brown rice. Orpah Cobb. Bulgur. Whole-grain  and low-sodium cereals. Pita bread. Low-fat, low-sodium crackers. Whole-wheat flour tortillas. Vegetables Fresh or frozen vegetables (raw, steamed, roasted, or grilled). Low-sodium or reduced-sodium tomato and vegetable juice. Low-sodium or reduced-sodium tomato sauce and tomato paste. Low-sodium or reduced-sodium canned vegetables. Fruits All fresh, dried, or frozen fruit. Canned fruit in natural juice (without added sugar). Meat and other protein foods Skinless chicken or [place]. Ground chicken or [place]. Pork with fat trimmed off. Fish and seafood. Egg whites. Dried beans, peas, or lentils. Unsalted nuts, nut butters, and seeds. Unsalted canned beans. Lean cuts of beef with fat trimmed off. Low-sodium, lean deli meat. Dairy Low-fat (1%) or fat-free (skim) milk. Fat-free, low-fat, or reduced-fat cheeses. Nonfat, low-sodium ricotta or cottage cheese. Low-fat or nonfat yogurt. Low-fat, low-sodium cheese. Fats and oils Soft margarine without trans fats. Vegetable oil. Low-fat, reduced-fat, or light mayonnaise and salad dressings (reduced-sodium). Canola, safflower, olive, soybean, and sunflower oils. Avocado. Seasoning and other foods Herbs. Spices. Seasoning mixes without salt. Unsalted popcorn and pretzels. Fat-free sweets. What foods are not recommended? The items listed may not be a complete list. Talk with your dietitian about what dietary choices are best for you. Grains Baked goods made with fat, such as croissants, muffins, or some breads. Dry pasta or rice meal packs. Vegetables Creamed or fried vegetables. Vegetables in a cheese sauce. Regular canned vegetables (not low-sodium or reduced-sodium). Regular canned tomato sauce and paste (not low-sodium or reduced-sodium). Regular tomato and vegetable juice (not low-sodium or reduced-sodium). Rosita Fire. Olives. Fruits Canned fruit in a light or heavy syrup. Fried fruit. Fruit in cream or butter sauce. Meat and other protein foods Fatty cuts  of meat. Ribs. Fried meat. Tomasa Blase. Sausage. Bologna and other processed lunch meats. Salami. Fatback. Hotdogs. Bratwurst. Salted nuts and seeds. Canned beans with added salt. Canned or smoked fish. Whole eggs or egg yolks. Chicken or [place] with skin. Dairy Whole or 2% milk, cream, and half-and-half. Whole or full-fat cream cheese. Whole-fat or sweetened yogurt. Full-fat cheese. Nondairy creamers. Whipped toppings. Processed cheese and cheese spreads. Fats and oils Butter. Stick margarine. Lard. Shortening. Ghee. Bacon fat. Tropical oils, such as coconut, palm kernel, or palm oil. Seasoning and other foods Salted popcorn and pretzels. Onion salt, garlic salt, seasoned salt, table salt, and sea salt. Worcestershire sauce. Tartar sauce. Barbecue sauce. Teriyaki sauce. Soy sauce, including reduced-sodium. Steak sauce. Canned and packaged gravies. Fish sauce. Oyster sauce. Cocktail sauce. Horseradish that you find on the shelf. Ketchup. Mustard. Meat flavorings and tenderizers. Bouillon cubes. Hot sauce and Tabasco sauce. Premade or packaged marinades. Premade or packaged taco seasonings. Relishes. Regular salad dressings. Where to find more information:  National Heart, Lung, and Blood Institute: PopSteam.is  American Heart Association: www.heart.org Summary  The DASH eating plan is a healthy eating plan that has been shown to reduce high blood pressure (hypertension). It may also reduce your risk for type 2 diabetes, heart disease, and stroke.  With the DASH eating plan, you should limit salt (sodium) intake to 2,300 mg a  day. If you have hypertension, you may need to reduce your sodium intake to 1,500 mg a day.  When on the DASH eating plan, aim to eat more fresh fruits and vegetables, whole grains, lean proteins, low-fat dairy, and heart-healthy fats.  Work with your health care provider or diet and nutrition specialist (dietitian) to adjust your eating plan to your individual calorie  needs. This information is not intended to replace advice given to you by your health care provider. Make sure you discuss any questions you have with your health care provider. Document Released: 01/17/2011 Document Revised: 01/22/2016 Document Reviewed: 01/22/2016 Elsevier Interactive Patient Education  [employer].    If you need a refill on your cardiac medications before your next appointment, please call your pharmacy.

## 2017-08-18 NOTE — Progress Notes (Addendum)
Cardiology Office Note:    Date:  08/18/2017   ID:  Wayne Mitchell, DOB 1935/07/04, MRN [id number]  PCP:  Wayne Housekeeper, MD  Cardiologist:  Wayne Noe, MD  Referring MD: Wayne Housekeeper, MD   Chief Complaint  Patient presents with  . Follow-up    CABG    History of Present Illness:    [name] is a 82 y.o. male with a past medical history significant for hypertension, syncope (2007, 2015, 2017),carotid artery disease ( left ICA 60-79 percent), thrombocytopenia and recently diagnosed CAD s/p CABG 06/02/17.  He has a history of syncope in 2007, 2015, 2017.  Echocardiogram revealed normal LV function with mild-to-moderate aortic insufficiency.  Felt to be likely related to dehydration or vasovagal episode. He was noted to be bradycardic and his beta blocker was discontinued. A Holter monitor off the beta blocker revealed sinus rhythm with occasional episodes of junctional tachycardia and occasional brief 3 second pauses during sleep but no evidence of heart block and no severe bradycardia.  In April he was evaluated for chest burning and underwent cardiac catheterization that revealed critical stenosis at the ostium of the LAD, severe stenosis of the first OM branch of the circumflex, patent RCA, mild segmental LV contraction abnormality with LVEF 50-55 percent. On 06/02/2017 he subsequently underwent three-vessel coronary artery bypass grafting using the LIMA to the LAD, SVG to diagonal and SVG to circumflex artery. Echocardiogram on 06/05/17 postoperatively showed EF 60-65%, moderate LVH, oncoordinate septal motion, grade 1 DD, trivial AI and MR.  Lopressor was started, but switched to low dose Coreg due to orthostatic hypotension. His amlodipine, HCTZ, irbesartan were discontinued due to orthostatic hypotenison. He was discharged home on 06/09/17 on aspirin 81 mg, atorvastatin 20 mg, carvedilol 3.125 mg, lasix 40 mg for 7 days. Wt was 158 at discharge.   On 06/19/2017 I saw him for  hospital follow-up at which time he was continuing to have some lightheadedness.  His orthostatics were normal.  He is here today with his wife and is feeling well.  He has had no chest burning since his bypass surgery.  He denies shortness of breath, which was not part of his initial complaints per the patient.  He is now only having occasional mild dizziness with fast movements, once or twice a week.  He has not been walking outside due to the heat.  He just moves around in his house.  He is planned to start cardiac rehab this week.  He still has mild lower leg edema, right greater than left.  His vein removal was on the right.  He has no orthopnea, PND or palpitations.  He reports that he is eating well, has gotten his appetite back to normal.  He is sleeping okay except for awakening several times to void.  He is trying to eat a heart healthy diet and is limiting sodium intake.   Past Medical History:  Diagnosis Date  . Carotid artery disease (HCC)   . Colon polyp    small  . Coronary artery disease   . Coronary artery disease involving native coronary artery of native heart with unstable angina pectoris (HCC) 05/30/2017   3V CABG 06/02/17  . HTN (hypertension)   . Hypothyroidism   . Syncope   . Syncope   . Thrombocytopenia (HCC)     Past Surgical History:  Procedure Laterality Date  . CATARACT EXTRACTION Left   . CORONARY ARTERY BYPASS GRAFT N/A 06/02/2017   Procedure: CORONARY  ARTERY BYPASS GRAFTING (CABG) x3 Using Left Internal Mammary Artery and Right Leg Greater Saphenous Vein harvested endoscopically;  Surgeon: Wayne Perna, MD;  Location: [facility];  Service: Open Heart Surgery;  Laterality: N/A;  . ESOPHAGOGASTRODUODENOSCOPY N/A 10/23/2014   Procedure: ESOPHAGOGASTRODUODENOSCOPY (EGD);  Surgeon: Wayne Redbird, MD;  Location: [facility] ENDOSCOPY;  Service: Endoscopy;  Laterality: N/A;  . HERNIA REPAIR    . LEFT HEART CATH AND CORONARY ANGIOGRAPHY N/A 05/30/2017   Procedure: LEFT HEART  CATH AND CORONARY ANGIOGRAPHY;  Surgeon: Wayne Bollman, MD;  Location: [facility] INVASIVE CV LAB;  Service: Cardiovascular;  Laterality: N/A;  . TEE WITHOUT CARDIOVERSION N/A 06/02/2017   Procedure: TRANSESOPHAGEAL ECHOCARDIOGRAM (TEE);  Surgeon: Wayne Mitchell, Wayne Arista, MD;  Location: [facility] OR;  Service: Open Heart Surgery;  Laterality: N/A;    Current Medications: Current Meds  Medication Sig  . acetaminophen (TYLENOL) 325 MG tablet Take 2 tablets (650 mg total) by mouth every 6 (six) hours as needed for mild pain.  Marland Kitchen aspirin EC 81 MG tablet Take 81 mg by mouth See admin instructions. Twice monthly... Pt takes 1 pill on the 1st and 1 pill on the 15th  . atorvastatin (LIPITOR) 20 MG tablet Take 20 mg by mouth daily.  . carvedilol (COREG) 3.125 MG tablet Take 1 tablet (3.125 mg total) by mouth daily.  Marland Kitchen levothyroxine (SYNTHROID, LEVOTHROID) 112 MCG tablet Take 112 mcg by mouth daily before breakfast.  . [DISCONTINUED] carvedilol (COREG) 3.125 MG tablet Take 1 tablet (3.125 mg total) by mouth daily.  . [DISCONTINUED] pantoprazole (PROTONIX) 20 MG tablet Take 20 mg by mouth 2 (two) times daily.     Allergies:   Lisinopril   Social History   Socioeconomic History  . Marital status: Married    Spouse name: Not on file  . Number of children: Not on file  . Years of education: Not on file  . Highest education level: Not on file  Occupational History  . Occupation: retired  Engineer, production  . Financial resource strain: Not on file  . Food insecurity:    Worry: Not on file    Inability: Not on file  . Transportation needs:    Medical: Not on file    Non-medical: Not on file  Tobacco Use  . Smoking status: Never Smoker  . Smokeless tobacco: Never Used  Substance and Sexual Activity  . Alcohol use: Yes    Alcohol/week: 2.4 oz    Types: 4 Cans of beer per week  . Drug use: No  . Sexual activity: Not on file  Lifestyle  . Physical activity:    Days per week: Not on file    Minutes per session: Not  on file  . Stress: Not on file  Relationships  . Social connections:    Talks on phone: Not on file    Gets together: Not on file    Attends religious service: Not on file    Active member of club or organization: Not on file    Attends meetings of clubs or organizations: Not on file    Relationship status: Not on file  Other Topics Concern  . Not on file  Social History Narrative  . Not on file     Family History: The patient's family history includes Healthy in his brother; Pancreatic cancer in his mother. ROS:   Please see the history of present illness.     All other systems reviewed and are negative.  EKGs/Labs/Other Studies Reviewed:    The  following studies were reviewed today:  Echocardiogram 06/05/17 Study Conclusions - Procedure narrative: Transthoracic echocardiography. Image quality was poor. The study was technically difficult, as a result of poor acoustic windows and poor sound wave transmission. - Left ventricle: The cavity size was normal. Wall thickness was increased in a pattern of moderate LVH. Systolic function was normal. The estimated ejection fraction was in the range of 60% to 65%. Incoordinate septal motion. Doppler parameters are consistent with abnormal left ventricular relaxation (grade 1 diastolic dysfunction). The E/e&' ratio is >15, suggesting elevated LV filling pressure. - Aortic valve: Poorly visualized. There was trivial regurgitation. - Mitral valve: Mildly thickened leaflets . There was trivial regurgitation. - Left atrium: The atrium was normal in size.  Impressions: - Technically difficult study. LVEF 60-65%, moderate LVH, incoordinate septal motion, grade 1 DD, elevated LV filling pressure, trivial AI, trivial MR, normal LA size.  LEFT HEART CATH AND CORONARY ANGIOGRAPHY  05/30/17  Conclusion   1. Critical stenosis at the ostium of the LAD, large vessel, appears to be a suitable target for grafting 2.  Severe stenosis of the first OM branch of the circumflex 3. Patent RCA, unable to selectively engage, suspected codominant vessel 4. Mild segmental LV contraction abnormality with LVEF estimated at 50-55%  Plan: IV heparin, TCTS consultation for consideration of CABG   Carotid dopplers 05/31/17 Right Carotid: Velocities in the right ICA are consistent with a 1-39% stenosis.        The ECA appears >50% stenosed. Left Carotid: Velocities in the left ICA are consistent with a 60-79% stenosis. Vertebrals: Bilateral vertebral arteries demonstrate antegrade flow.   EKG:  EKG is not ordered today.   Recent Labs: 06/04/2017: TSH 5.209 06/07/2017: ALT 22 06/09/2017: Hemoglobin 9.3; Magnesium 1.7; Platelets 127 06/19/2017: BUN 14; Creatinine, Ser 1.14; Potassium 3.9; Sodium 140   Recent Lipid Panel    Component Value Date/Time   CHOL 130 05/31/2017 0517   TRIG 63 05/31/2017 0517   HDL 56 05/31/2017 0517   CHOLHDL 2.3 05/31/2017 0517   VLDL 13 05/31/2017 0517   LDLCALC 61 05/31/2017 0517    Physical Exam:    VS:  BP 112/60   Pulse 86   Ht 5\' 7"  (1.702 m)   Wt 148 lb 6.4 oz (67.3 kg)   SpO2 94%   BMI 23.24 kg/m     Wt Readings from Last 3 Encounters:  08/18/17 148 lb 6.4 oz (67.3 kg)  08/12/17 149 lb 0.5 oz (67.6 kg)  07/14/17 156 lb (70.8 kg)     Physical Exam  Constitutional: He is oriented to person, place, and time. He appears well-developed and well-nourished. No distress.  HENT:  Head: Normocephalic and atraumatic.  Neck: Normal range of motion. Neck supple. No JVD present. Carotid bruit is present.  Bilateral carotid bruits  Cardiovascular: Normal rate, regular rhythm, normal heart sounds and intact distal pulses. Exam reveals no gallop and no friction rub.  No murmur heard. Pulmonary/Chest: Effort normal and breath sounds normal. No respiratory distress. He has no wheezes. He has no rales.  Abdominal: Soft. Bowel sounds are normal.  Musculoskeletal: Normal  range of motion. He exhibits edema.  Trace lower leg edema, R>L  Neurological: He is alert and oriented to person, place, and time.  Skin: Skin is warm and dry.  Psychiatric: He has a normal mood and affect. His behavior is normal. Thought content normal.  Vitals reviewed.    ASSESSMENT:    1. Atherosclerosis of native coronary artery of  native heart without angina pectoris   2. PVC's (premature ventricular contractions)   3. Essential (primary) hypertension   4. Hyperlipidemia, unspecified hyperlipidemia type    PLAN:    In order of problems listed above:  CAD S/P 3V CABG 06/02/17: The patient is recovering well.  He is tolerating low-dose carvedilol, aspirin and statin.  Unable to tolerate ACE or ARB due to orthostatic hypotension symptoms.  No anginal symptoms.  Continue current medications.  He is to start cardiac rehab this week.  Plan follow-up with Dr. Katrinka Blazing in 3 to 4 months after completion of cardiac rehab. Pt needs to have trace lower leg edema, right greater than left.  Advised that he can wear compression stockings and keep feet elevated as much as possible.  Advised on DASH diet, low sodium intake.  PVCs: No palpitations  Hypertension: Antihypertensive medications were stopped in the hospital due to orthostatic hypotension.  His lightheadedness seems to be much better but still occasionally present.  His blood pressure was elevated at his surgery follow-up but is still on the low side here.  We will continue his current regimen.  Hyperlipidemia: On atorvastatin 20 mg. LDL 56 on 05/31/17, at goal of <70. Continue current therapy.   Carotid artery stenosis: By preop vascular studies 05/31/2017: right ICA  1-39% stenosis, right ECA appears >50% stenosed, left ICA 60-79% stenosis.  Bilateral carotid bruits present.  Follow-up carotid Dopplers in 1 year.  Aggressive risk factor modification: Heart healthy diet, exercise, aspirin and statin, blood pressure control.   Medication  Adjustments/Labs and Tests Ordered: Current medicines are reviewed at length with the patient today.  Concerns regarding medicines are outlined above. Labs and tests ordered and medication changes are outlined in the patient instructions below:  Patient Instructions  Medication Instructions: Your physician recommends that you continue on your current medications as directed. Please refer to the Current Medication list given to you today.   Labwork: None  Procedures/Testing: None  Follow-Up: Your physician recommends that you schedule a follow-up appointment in: 3-4 months with Dr.Smith    Any Additional Special Instructions Will Be Listed Below (If Applicable).   DASH Eating Plan DASH stands for "Dietary Approaches to Stop Hypertension." The DASH eating plan is a healthy eating plan that has been shown to reduce high blood pressure (hypertension). It may also reduce your risk for type 2 diabetes, heart disease, and stroke. The DASH eating plan may also help with weight loss. What are tips for following this plan? General guidelines  Avoid eating more than 2,300 mg (milligrams) of salt (sodium) a day. If you have hypertension, you may need to reduce your sodium intake to 1,500 mg a day.  Limit alcohol intake to no more than 1 drink a day for nonpregnant women and 2 drinks a day for men. One drink equals 12 oz of beer, 5 oz of wine, or 1 oz of hard liquor.  Work with your health care provider to maintain a healthy body weight or to lose weight. Ask what an ideal weight is for you.  Get at least 30 minutes of exercise that causes your heart to beat faster (aerobic exercise) most days of the week. Activities may include walking, swimming, or biking.  Work with your health care provider or diet and nutrition specialist (dietitian) to adjust your eating plan to your individual calorie needs. Reading food labels  Check food labels for the amount of sodium per serving. Choose foods with  less than 5 percent of the  Daily Value of sodium. Generally, foods with less than 300 mg of sodium per serving fit into this eating plan.  To find whole grains, look for the word "whole" as the first word in the ingredient list. Shopping  Buy products labeled as "low-sodium" or "no salt added."  Buy fresh foods. Avoid canned foods and premade or frozen meals. Cooking  Avoid adding salt when cooking. Use salt-free seasonings or herbs instead of table salt or sea salt. Check with your health care provider or pharmacist before using salt substitutes.  Do not fry foods. Cook foods using healthy methods such as baking, boiling, grilling, and broiling instead.  Cook with heart-healthy oils, such as olive, canola, soybean, or sunflower oil. Meal planning   Eat a balanced diet that includes: ? 5 or more servings of fruits and vegetables each day. At each meal, try to fill half of your plate with fruits and vegetables. ? Up to 6-8 servings of whole grains each day. ? Less than 6 oz of lean meat, poultry, or fish each day. A 3-oz serving of meat is about the same size as a deck of cards. One egg equals 1 oz. ? 2 servings of low-fat dairy each day. ? A serving of nuts, seeds, or beans 5 times each week. ? Heart-healthy fats. Healthy fats called Omega-3 fatty acids are found in foods such as flaxseeds and coldwater fish, like sardines, salmon, and mackerel.  Limit how much you eat of the following: ? Canned or prepackaged foods. ? Food that is high in trans fat, such as fried foods. ? Food that is high in saturated fat, such as fatty meat. ? Sweets, desserts, sugary drinks, and other foods with added sugar. ? Full-fat dairy products.  Do not salt foods before eating.  Try to eat at least 2 vegetarian meals each week.  Eat more home-cooked food and less restaurant, buffet, and fast food.  When eating at a restaurant, ask that your food be prepared with less salt or no salt, if  possible. What foods are recommended? The items listed may not be a complete list. Talk with your dietitian about what dietary choices are best for you. Grains Whole-grain or whole-wheat bread. Whole-grain or whole-wheat pasta. Brown rice. Orpah Cobb. Bulgur. Whole-grain and low-sodium cereals. Pita bread. Low-fat, low-sodium crackers. Whole-wheat flour tortillas. Vegetables Fresh or frozen vegetables (raw, steamed, roasted, or grilled). Low-sodium or reduced-sodium tomato and vegetable juice. Low-sodium or reduced-sodium tomato sauce and tomato paste. Low-sodium or reduced-sodium canned vegetables. Fruits All fresh, dried, or frozen fruit. Canned fruit in natural juice (without added sugar). Meat and other protein foods Skinless chicken or [place]. Ground chicken or [place]. Pork with fat trimmed off. Fish and seafood. Egg whites. Dried beans, peas, or lentils. Unsalted nuts, nut butters, and seeds. Unsalted canned beans. Lean cuts of beef with fat trimmed off. Low-sodium, lean deli meat. Dairy Low-fat (1%) or fat-free (skim) milk. Fat-free, low-fat, or reduced-fat cheeses. Nonfat, low-sodium ricotta or cottage cheese. Low-fat or nonfat yogurt. Low-fat, low-sodium cheese. Fats and oils Soft margarine without trans fats. Vegetable oil. Low-fat, reduced-fat, or light mayonnaise and salad dressings (reduced-sodium). Canola, safflower, olive, soybean, and sunflower oils. Avocado. Seasoning and other foods Herbs. Spices. Seasoning mixes without salt. Unsalted popcorn and pretzels. Fat-free sweets. What foods are not recommended? The items listed may not be a complete list. Talk with your dietitian about what dietary choices are best for you. Grains Baked goods made with fat, such as croissants, muffins, or some  breads. Dry pasta or rice meal packs. Vegetables Creamed or fried vegetables. Vegetables in a cheese sauce. Regular canned vegetables (not low-sodium or reduced-sodium). Regular canned  tomato sauce and paste (not low-sodium or reduced-sodium). Regular tomato and vegetable juice (not low-sodium or reduced-sodium). Rosita Fire. Olives. Fruits Canned fruit in a light or heavy syrup. Fried fruit. Fruit in cream or butter sauce. Meat and other protein foods Fatty cuts of meat. Ribs. Fried meat. Tomasa Blase. Sausage. Bologna and other processed lunch meats. Salami. Fatback. Hotdogs. Bratwurst. Salted nuts and seeds. Canned beans with added salt. Canned or smoked fish. Whole eggs or egg yolks. Chicken or [place] with skin. Dairy Whole or 2% milk, cream, and half-and-half. Whole or full-fat cream cheese. Whole-fat or sweetened yogurt. Full-fat cheese. Nondairy creamers. Whipped toppings. Processed cheese and cheese spreads. Fats and oils Butter. Stick margarine. Lard. Shortening. Ghee. Bacon fat. Tropical oils, such as coconut, palm kernel, or palm oil. Seasoning and other foods Salted popcorn and pretzels. Onion salt, garlic salt, seasoned salt, table salt, and sea salt. Worcestershire sauce. Tartar sauce. Barbecue sauce. Teriyaki sauce. Soy sauce, including reduced-sodium. Steak sauce. Canned and packaged gravies. Fish sauce. Oyster sauce. Cocktail sauce. Horseradish that you find on the shelf. Ketchup. Mustard. Meat flavorings and tenderizers. Bouillon cubes. Hot sauce and Tabasco sauce. Premade or packaged marinades. Premade or packaged taco seasonings. Relishes. Regular salad dressings. Where to find more information:  National Heart, Lung, and Blood Institute: PopSteam.is  American Heart Association: www.heart.org Summary  The DASH eating plan is a healthy eating plan that has been shown to reduce high blood pressure (hypertension). It may also reduce your risk for type 2 diabetes, heart disease, and stroke.  With the DASH eating plan, you should limit salt (sodium) intake to 2,300 mg a day. If you have hypertension, you may need to reduce your sodium intake to 1,500 mg a day.  When  on the DASH eating plan, aim to eat more fresh fruits and vegetables, whole grains, lean proteins, low-fat dairy, and heart-healthy fats.  Work with your health care provider or diet and nutrition specialist (dietitian) to adjust your eating plan to your individual calorie needs. This information is not intended to replace advice given to you by your health care provider. Make sure you discuss any questions you have with your health care provider. Document Released: 01/17/2011 Document Revised: 01/22/2016 Document Reviewed: 01/22/2016 Elsevier Interactive Patient Education  [employer].    If you need a refill on your cardiac medications before your next appointment, please call your pharmacy.      Signed, Berton Bon, NP  08/18/2017 8:26 AM    [facility]

## 2017-08-20 ENCOUNTER — Encounter (HOSPITAL_COMMUNITY)
Admission: RE | Admit: 2017-08-20 | Discharge: 2017-08-20 | Disposition: A | Discharge status: Home / Self Care | Payer: Medicare HMO | Source: Ambulatory Visit | Attending: Interventional Cardiology | Admitting: Interventional Cardiology

## 2017-08-20 ENCOUNTER — Encounter (HOSPITAL_COMMUNITY): Payer: Self-pay

## 2017-08-20 DIAGNOSIS — Z79890 Hormone replacement therapy (postmenopausal): Secondary | ICD-10-CM | POA: Diagnosis not present

## 2017-08-20 DIAGNOSIS — Z951 Presence of aortocoronary bypass graft: Secondary | ICD-10-CM

## 2017-08-20 DIAGNOSIS — I2510 Atherosclerotic heart disease of native coronary artery without angina pectoris: Secondary | ICD-10-CM | POA: Diagnosis not present

## 2017-08-20 DIAGNOSIS — Z79899 Other long term (current) drug therapy: Secondary | ICD-10-CM | POA: Diagnosis not present

## 2017-08-20 DIAGNOSIS — Z7984 Long term (current) use of oral hypoglycemic drugs: Secondary | ICD-10-CM | POA: Diagnosis not present

## 2017-08-20 DIAGNOSIS — E039 Hypothyroidism, unspecified: Secondary | ICD-10-CM | POA: Diagnosis not present

## 2017-08-20 DIAGNOSIS — D696 Thrombocytopenia, unspecified: Secondary | ICD-10-CM | POA: Diagnosis not present

## 2017-08-20 DIAGNOSIS — Z7982 Long term (current) use of aspirin: Secondary | ICD-10-CM | POA: Diagnosis not present

## 2017-08-20 DIAGNOSIS — I10 Essential (primary) hypertension: Secondary | ICD-10-CM | POA: Diagnosis not present

## 2017-08-20 NOTE — Progress Notes (Signed)
Daily Session Note  Patient Details  Name: Wayne Mitchell MRN: [id number] Date of Birth: 1935/07/26 Referring Provider:   Flowsheet Row CARDIAC REHAB PHASE II ORIENTATION from 08/12/2017 in [place]  Referring Provider  Daneen Schick MD      Encounter Date: 08/20/2017  Check In: Session Check In - 08/20/17 1000    Check-In          Location  [facility]    Staff Present  Amber Fair, MS, ACSM RCEP, Exercise Physiologist;Marty Uy, RN, BSN;Carlette Carlton, RN, BSN    Supervising physician immediately available to respond to emergencies  [facility] immediately available    Physician(s)  Dr. Darrick Meigs    Medication changes reported      No    Fall or balance concerns reported     No    Tobacco Cessation  No Change    Warm-up and Cool-down  Performed as group-led instruction    Resistance Training Performed  No    VAD Patient?  No    PAD/SET Patient?  No        Pain Assessment          Currently in Pain?  No/denies           Capillary Blood Glucose: No results found for this or any previous visit (from the past 24 hour(s)).    Social History   Tobacco Use  Smoking Status Never Smoker  Smokeless Tobacco Never Used    Goals Met:  Exercise tolerated well  Goals Unmet:  Not Applicable  Comments: Pt started cardiac rehab today.  Pt tolerated light exercise without difficulty. VSS, telemetry-sinus rhythm,  asymptomatic.  Medication list reconciled. Pt denies barriers to medicaiton compliance.  PSYCHOSOCIAL ASSESSMENT:  PHQ-.  Pt exhibits positive coping skills, hopeful outlook with supportive family. No psychosocial needs identified at this time, no psychosocial interventions necessary.    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   Pt oriented to exercise equipment and routine.    Understanding verbalized.   Dr. Fransico Him is Medical Director for Cardiac Rehab at [facility]

## 2017-08-21 ENCOUNTER — Encounter (HOSPITAL_COMMUNITY): Payer: Self-pay

## 2017-08-21 ENCOUNTER — Other Ambulatory Visit: Payer: Self-pay | Admitting: *Deleted

## 2017-08-21 MED ORDER — CARVEDILOL 3.125 MG PO TABS: 3.1250 mg | ORAL_TABLET | Freq: Every day | ORAL | 3 refills | Status: DC

## 2017-08-21 NOTE — Progress Notes (Signed)
Cardiac Individual Treatment Plan  Patient Details  Name: Wayne Mitchell MRN: [id number] Date of Birth: 1936-01-30 Referring Provider:   Flowsheet Row CARDIAC REHAB PHASE II ORIENTATION from 08/12/2017 in [facility] CARDIAC REHAB  Referring Provider  Verdis Prime MD      Initial Encounter Date:  Flowsheet Row CARDIAC REHAB PHASE II ORIENTATION from 08/12/2017 in [facility] CARDIAC REHAB  Date  08/12/17      Visit Diagnosis: 06/02/17 S/P CABG x 3  Patient's Home Medications on Admission:  Current Outpatient Medications:  .  acetaminophen (TYLENOL) 325 MG tablet, Take 2 tablets (650 mg total) by mouth every 6 (six) hours as needed for mild pain., Disp: , Rfl:  .  aspirin EC 81 MG tablet, Take 81 mg by mouth See admin instructions. Twice monthly... Pt takes 1 pill on the 1st and 1 pill on the 15th, Disp: , Rfl:  .  atorvastatin (LIPITOR) 20 MG tablet, Take 20 mg by mouth daily., Disp: , Rfl:  .  carvedilol (COREG) 3.125 MG tablet, Take 1 tablet (3.125 mg total) by mouth daily., Disp: 180 tablet, Rfl: 3 .  levothyroxine (SYNTHROID, LEVOTHROID) 112 MCG tablet, Take 112 mcg by mouth daily before breakfast., Disp: , Rfl:   Past Medical History: Past Medical History:  Diagnosis Date  . Carotid artery disease (HCC)   . Colon polyp    small  . Coronary artery disease   . Coronary artery disease involving native coronary artery of native heart with unstable angina pectoris (HCC) 05/30/2017   3V CABG 06/02/17  . HTN (hypertension)   . Hypothyroidism   . Syncope   . Syncope   . Thrombocytopenia (HCC)     Tobacco Use: Social History   Tobacco Use  Smoking Status Never Smoker  Smokeless Tobacco Never Used    Labs: Recent Review Flowsheet Data    Labs for ITP Cardiac and Pulmonary Rehab Latest Ref Rng & Units 06/04/2017 06/05/2017 06/05/2017 06/05/2017 06/06/2017   Cholestrol 0 - 200 mg/dL - - - - -   LDLCALC 0 - 99 mg/dL - - - - -   HDL >40 mg/dL - -  - - -   Trlycerides <150 mg/dL - - - - -   Hemoglobin A1c 4.8 - 5.6 % - - - - -   PHART 7.350 - 7.450 - - 7.436 - -   PCO2ART 32.0 - 48.0 mmHg - - 34.8 - -   HCO3 20.0 - 28.0 mmol/L - - 23.5 - -   TCO2 22 - 32 mmol/L - - 25 26 -   ACIDBASEDEF 0.0 - 2.0 mmol/L - - 1.0 - -   O2SAT % 46.1 39.8 97.0 - 54.8      Capillary Blood Glucose: Lab Results  Component Value Date   GLUCAP 125 (H) 06/08/2017   GLUCAP 118 (H) 06/08/2017   GLUCAP 106 (H) 06/07/2017   GLUCAP 126 (H) 06/07/2017   GLUCAP 113 (H) 06/07/2017     Exercise Target Goals:    Exercise Program Goal: Individual exercise prescription set using results from initial 6 min walk test and THRR while considering  patient's activity barriers and safety.   Exercise Prescription Goal: Initial exercise prescription builds to 30-45 minutes a day of aerobic activity, 2-3 days per week.  Home exercise guidelines will be given to patient during program as part of exercise prescription that the participant will acknowledge.  Activity Barriers & Risk Stratification: Activity Barriers & Cardiac Risk  Stratification - 08/12/17 1055    Activity Barriers & Cardiac Risk Stratification          Activity Barriers  Deconditioning;Muscular Weakness    Cardiac Risk Stratification  High           6 Minute Walk: 6 Minute Walk    6 Minute Walk    Row Name 08/12/17 1054   Phase  Initial   Distance  1456 feet   Walk Time  6 minutes   # of Rest Breaks  0   MPH  2.76   METS  3.09   RPE  10   VO2 Peak  10.83   Symptoms  No   Resting HR  99 bpm   Resting BP  112/62   Resting Oxygen Saturation   98 %   Exercise Oxygen Saturation  during 6 min walk  98 %   Max Ex. HR  115 bpm   Max Ex. BP  134/78   2 Minute Post BP  130/70          Oxygen Initial Assessment:   Oxygen Re-Evaluation:   Oxygen Discharge (Final Oxygen Re-Evaluation):   Initial Exercise Prescription: Initial Exercise Prescription - 08/12/17 1000    Date of  Initial Exercise RX and Referring Provider          Date  08/12/17    Referring Provider  Verdis Prime MD    Expected Discharge Date  11/12/17        Bike          Level  0.5    Minutes  10    METs  2.4        NuStep          Level  2    SPM  70    Minutes  10    METs  2        Track          Laps  10    Minutes  10    METs  2.74        Prescription Details          Frequency (times per week)  3    Duration  Progress to 30 minutes of continuous aerobic without signs/symptoms of physical distress        Intensity          THRR 40-80% of Max Heartrate  56-113    Ratings of Perceived Exertion  11-13    Perceived Dyspnea  0-4        Progression          Progression  Continue to progress workloads to maintain intensity without signs/symptoms of physical distress.        Resistance Training          Training Prescription  Yes    Weight  2lbs    Reps  10-15           Perform Capillary Blood Glucose checks as needed.  Exercise Prescription Changes:   Exercise Comments:   Exercise Goals and Review: Exercise Goals    Exercise Goals    Row Name 08/12/17 0935   Increase Physical Activity  Yes   Intervention  Provide advice, education, support and counseling about physical activity/exercise needs.;Develop an individualized exercise prescription for aerobic and resistive training based on initial evaluation findings, risk stratification, comorbidities and participant's personal goals.   Expected Outcomes  Short Term: Attend rehab on a regular basis to increase amount  of physical activity.;Long Term: Add in home exercise to make exercise part of routine and to increase amount of physical activity.;Long Term: Exercising regularly at least 3-5 days a week.   Increase Strength and Stamina  Yes   Intervention  Provide advice, education, support and counseling about physical activity/exercise needs.;Develop an individualized exercise prescription for aerobic and  resistive training based on initial evaluation findings, risk stratification, comorbidities and participant's personal goals.   Expected Outcomes  Short Term: Increase workloads from initial exercise prescription for resistance, speed, and METs.;Short Term: Perform resistance training exercises routinely during rehab and add in resistance training at home;Long Term: Improve cardiorespiratory fitness, muscular endurance and strength as measured by increased METs and functional capacity ( )   Able to understand and use rate of perceived exertion (RPE) scale  Yes   Intervention  Provide education and explanation on how to use RPE scale   Expected Outcomes  Long Term:  Able to use RPE to guide intensity level when exercising independently;Short Term: Able to use RPE daily in rehab to express subjective intensity level   Able to understand and use Dyspnea scale  Yes   Intervention  Provide education and explanation on how to use Dyspnea scale   Expected Outcomes  Short Term: Able to use Dyspnea scale daily in rehab to express subjective sense of shortness of breath during exertion;Long Term: Able to use Dyspnea scale to guide intensity level when exercising independently   Knowledge and understanding of Target Heart Rate Range (THRR)  Yes   Intervention  Provide education and explanation of THRR including how the numbers were predicted and where they are located for reference   Expected Outcomes  Short Term: Able to state/look up THRR;Long Term: Able to use THRR to govern intensity when exercising independently;Short Term: Able to use daily as guideline for intensity in rehab   Able to check pulse independently  Yes   Intervention  Provide education and demonstration on how to check pulse in carotid and radial arteries.;Review the importance of being able to check your own pulse for safety during independent exercise   Expected Outcomes  Short Term: Able to explain why pulse checking is important during  independent exercise;Long Term: Able to check pulse independently and accurately   Understanding of Exercise Prescription  Yes   Intervention  Provide education, explanation, and written materials on patient's individual exercise prescription   Expected Outcomes  Short Term: Able to explain program exercise prescription;Long Term: Able to explain home exercise prescription to exercise independently          Exercise Goals Re-Evaluation :    Discharge Exercise Prescription (Final Exercise Prescription Changes):   Nutrition:  Target Goals: Understanding of nutrition guidelines, daily intake of sodium 1500mg , cholesterol 200mg , calories 30% from fat and 7% or less from saturated fats, daily to have 5 or more servings of fruits and vegetables.  Biometrics: Pre Biometrics - 08/12/17 1058    Pre Biometrics          Height  5\' 7"  (1.702 m)    Weight  149 lb 0.5 oz (67.6 kg)    Waist Circumference  33 inches    Hip Circumference  38.25 inches    Waist to Hip Ratio  0.86 %    BMI (Calculated)  23.34    Triceps Skinfold  23 mm    % Body Fat  24.9 %    Grip Strength  29 kg    Flexibility  0 in  Single Leg Stand  6.35 seconds            Nutrition Therapy Plan and Nutrition Goals: Nutrition Therapy & Goals - 08/12/17 1327    Nutrition Therapy          Diet  consistent carbohydrate heart healthy        Personal Nutrition Goals          Nutrition Goal  identify and limit food sources of saturated fat, trans fat, and sodium    Personal Goal #2  pt able to name foods that affect blood glucose        Intervention Plan          Intervention  Prescribe, educate and counsel regarding individualized specific dietary modifications aiming towards targeted core components such as weight, hypertension, lipid management, diabetes, heart failure and other comorbidities.    Expected Outcomes  Short Term Goal: Understand basic principles of dietary content, such as calories, fat,  sodium, cholesterol and nutrients.           Nutrition Assessments: Nutrition Assessments - 08/12/17 1328    MEDFICTS Scores          Pre Score  25           Nutrition Goals Re-Evaluation:   Nutrition Goals Re-Evaluation:   Nutrition Goals Discharge (Final Nutrition Goals Re-Evaluation):   Psychosocial: Target Goals: Acknowledge presence or absence of significant depression and/or stress, maximize coping skills, provide positive support system. Participant is able to verbalize types and ability to use techniques and skills needed for reducing stress and depression.  Initial Review & Psychosocial Screening: Initial Psych Review & Screening - 08/12/17 1006    Initial Review          Current issues with  None Identified        Family Dynamics          Good Support System?  Yes spouse        Barriers          Psychosocial barriers to participate in program  There are no identifiable barriers or psychosocial needs.        Screening Interventions          Interventions  Encouraged to exercise           Quality of Life Scores: Quality of Life - 08/20/17 1227    Quality of Life          Select  Quality of Life        Quality of Life Scores          Health/Function Pre  26.2 %    Socioeconomic Pre  30 %    Psych/Spiritual Pre  29.14 %    GLOBAL Pre  28.21 % scores reviewed with pt. no concerns identified. pt encouraged to participate in CR exercise for overall well being.           Scores of 19 and below usually indicate a poorer quality of life in these areas.  A difference of  2-3 points is a clinically meaningful difference.  A difference of 2-3 points in the total score of the Quality of Life Index has been associated with significant improvement in overall quality of life, self-image, physical symptoms, and general health in studies assessing change in quality of life.  PHQ-9: Recent Review Flowsheet Data    Depression screen [facility] 2/9 08/20/2017    Decreased Interest 0   Down, Depressed, Hopeless 0   PHQ -  2 Score 0     Interpretation of Total Score  Total Score Depression Severity:  1-4 = Minimal depression, 5-9 = Mild depression, 10-14 = Moderate depression, 15-19 = Moderately severe depression, 20-27 = Severe depression   Psychosocial Evaluation and Intervention: Psychosocial Evaluation - 08/20/17 1219    Psychosocial Evaluation & Interventions          Interventions  Encouraged to exercise with the program and follow exercise prescription    Comments  no psychosocial needs identified, no interventions necessary. pt enjoys yard work and [employer].     Expected Outcomes  pt will exhibit positive outlook with good coping skills.     Continue Psychosocial Services   No Follow up required           Psychosocial Re-Evaluation: Psychosocial Re-Evaluation    Psychosocial Re-Evaluation    Row Name 08/21/17 [phone]   Current issues with  None Identified   Comments  no psychosocial needs identified, no interventions necessary    Expected Outcomes  pt will exhibit positive outlook with good coping skills.    Interventions  Encouraged to attend Cardiac Rehabilitation for the exercise   Continue Psychosocial Services   No Follow up required          Psychosocial Discharge (Final Psychosocial Re-Evaluation): Psychosocial Re-Evaluation - 08/21/17 0812    Psychosocial Re-Evaluation          Current issues with  None Identified    Comments  no psychosocial needs identified, no interventions necessary     Expected Outcomes  pt will exhibit positive outlook with good coping skills.     Interventions  Encouraged to attend Cardiac Rehabilitation for the exercise    Continue Psychosocial Services   No Follow up required           Vocational Rehabilitation: Provide vocational rehab assistance to qualifying candidates.   Vocational Rehab Evaluation & Intervention: Vocational Rehab - 08/12/17 1006    Initial Vocational Rehab  Evaluation & Intervention          Assessment shows need for Vocational Rehabilitation  No retired            Education: Education Goals: Education classes will be provided on a weekly basis, covering required topics. Participant will state understanding/return demonstration of topics presented.  Learning Barriers/Preferences: Learning Barriers/Preferences - 08/12/17 0935    Learning Barriers/Preferences          Learning Barriers  Sight    Learning Preferences  Written Material           Education Topics: Count Your Pulse:  -Group instruction provided by verbal instruction, demonstration, patient participation and written materials to support subject.  Instructors address importance of being able to find your pulse and how to count your pulse when at home without a heart monitor.  Patients get hands on experience counting their pulse with staff help and individually.   Heart Attack, Angina, and Risk Factor Modification:  -Group instruction provided by verbal instruction, video, and written materials to support subject.  Instructors address signs and symptoms of angina and heart attacks.    Also discuss risk factors for heart disease and how to make changes to improve heart health risk factors.   Functional Fitness:  -Group instruction provided by verbal instruction, demonstration, patient participation, and written materials to support subject.  Instructors address safety measures for doing things around the house.  Discuss how to get up and down off the floor, how to pick things up  properly, how to safely get out of a chair without assistance, and balance training.   Meditation and Mindfulness:  -Group instruction provided by verbal instruction, patient participation, and written materials to support subject.  Instructor addresses importance of mindfulness and meditation practice to help reduce stress and improve awareness.  Instructor also leads participants through a  meditation exercise.    Stretching for Flexibility and Mobility:  -Group instruction provided by verbal instruction, patient participation, and written materials to support subject.  Instructors lead participants through series of stretches that are designed to increase flexibility thus improving mobility.  These stretches are additional exercise for major muscle groups that are typically performed during regular warm up and cool down.   Hands Only CPR:  -Group verbal, video, and participation provides a basic overview of AHA guidelines for community CPR. Role-play of emergencies allow participants the opportunity to practice calling for help and chest compression technique with discussion of AED use.   Hypertension: -Group verbal and written instruction that provides a basic overview of hypertension including the most recent diagnostic guidelines, risk factor reduction with self-care instructions and medication management.    Nutrition I class: Heart Healthy Eating:  -Group instruction provided by PowerPoint slides, verbal discussion, and written materials to support subject matter. The instructor gives an explanation and review of the Therapeutic Lifestyle Changes diet recommendations, which includes a discussion on lipid goals, dietary fat, sodium, fiber, plant stanol/sterol esters, sugar, and the components of a well-balanced, healthy diet.   Nutrition II class: Lifestyle Skills:  -Group instruction provided by PowerPoint slides, verbal discussion, and written materials to support subject matter. The instructor gives an explanation and review of label reading, grocery shopping for heart health, heart healthy recipe modifications, and ways to make healthier choices when eating out.   Diabetes Question & Answer:  -Group instruction provided by PowerPoint slides, verbal discussion, and written materials to support subject matter. The instructor gives an explanation and review of diabetes  co-morbidities, pre- and post-prandial blood glucose goals, pre-exercise blood glucose goals, signs, symptoms, and treatment of hypoglycemia and hyperglycemia, and foot care basics.   Diabetes Blitz:  -Group instruction provided by PowerPoint slides, verbal discussion, and written materials to support subject matter. The instructor gives an explanation and review of the physiology behind type 1 and type 2 diabetes, diabetes medications and rational behind using different medications, pre- and post-prandial blood glucose recommendations and Hemoglobin A1c goals, diabetes diet, and exercise including blood glucose guidelines for exercising safely.    Portion Distortion:  -Group instruction provided by PowerPoint slides, verbal discussion, written materials, and food models to support subject matter. The instructor gives an explanation of serving size versus portion size, changes in portions sizes over the last 20 years, and what consists of a serving from each food group.   Stress Management:  -Group instruction provided by verbal instruction, video, and written materials to support subject matter.  Instructors review role of stress in heart disease and how to cope with stress positively.     Exercising on Your Own:  -Group instruction provided by verbal instruction, power point, and written materials to support subject.  Instructors discuss benefits of exercise, components of exercise, frequency and intensity of exercise, and end points for exercise.  Also discuss use of nitroglycerin and activating EMS.  Review options of places to exercise outside of rehab.  Review guidelines for sex with heart disease.   Cardiac Drugs I:  -Group instruction provided by verbal instruction and written materials to  support subject.  Instructor reviews cardiac drug classes: antiplatelets, anticoagulants, beta blockers, and statins.  Instructor discusses reasons, side effects, and lifestyle considerations for each  drug class.   Cardiac Drugs II:  -Group instruction provided by verbal instruction and written materials to support subject.  Instructor reviews cardiac drug classes: angiotensin converting enzyme inhibitors (ACE-I), angiotensin II receptor blockers (ARBs), nitrates, and calcium channel blockers.  Instructor discusses reasons, side effects, and lifestyle considerations for each drug class.   Anatomy and Physiology of the Circulatory System:  Group verbal and written instruction and models provide basic cardiac anatomy and physiology, with the coronary electrical and arterial systems. Review of: AMI, Angina, Valve disease, Heart Failure, Peripheral Artery Disease, Cardiac Arrhythmia, Pacemakers, and the ICD.   Other Education:  -Group or individual verbal, written, or video instructions that support the educational goals of the cardiac rehab program.   Holiday Eating Survival Tips:  -Group instruction provided by PowerPoint slides, verbal discussion, and written materials to support subject matter. The instructor gives patients tips, tricks, and techniques to help them not only survive but enjoy the holidays despite the onslaught of food that accompanies the holidays.   Knowledge Questionnaire Score: Knowledge Questionnaire Score - 08/12/17 1006    Knowledge Questionnaire Score          Pre Score  20/24           Core Components/Risk Factors/Patient Goals at Admission: Personal Goals and Risk Factors at Admission - 08/12/17 0936    Core Components/Risk Factors/Patient Goals on Admission           Weight Management  Yes;Weight Maintenance    Intervention  Weight Management: Develop a combined nutrition and exercise program designed to reach desired caloric intake, while maintaining appropriate intake of nutrient and fiber, sodium and fats, and appropriate energy expenditure required for the weight goal.;Weight Management: Provide education and appropriate resources to help participant  work on and attain dietary goals.    Admit Weight  149 lb 0.5 oz (67.6 kg)    Goal Weight: Short Term  149 lb (67.6 kg)    Goal Weight: Long Term  149 lb (67.6 kg)    Expected Outcomes  Short Term: Continue to assess and modify interventions until short term weight is achieved;Long Term: Adherence to nutrition and physical activity/exercise program aimed toward attainment of established weight goal;Weight Maintenance: Understanding of the daily nutrition guidelines, which includes 25-35% calories from fat, 7% or less cal from saturated fats, less than 200mg  cholesterol, less than 1.5gm of sodium, & 5 or more servings of fruits and vegetables daily;Understanding recommendations for meals to include 15-35% energy as protein, 25-35% energy from fat, 35-60% energy from carbohydrates, less than 200mg  of dietary cholesterol, 20-35 gm of total fiber daily;Understanding of distribution of calorie intake throughout the day with the consumption of 4-5 meals/snacks    Hypertension  Yes    Intervention  Provide education on lifestyle modifcations including regular physical activity/exercise, weight management, moderate sodium restriction and increased consumption of fresh fruit, vegetables, and low fat dairy, alcohol moderation, and smoking cessation.;Monitor prescription use compliance.    Expected Outcomes  Short Term: Continued assessment and intervention until BP is < 140/14mm HG in hypertensive participants. < 130/47mm HG in hypertensive participants with diabetes, heart failure or chronic kidney disease.;Long Term: Maintenance of blood pressure at goal levels.    Lipids  Yes    Intervention  Provide education and support for participant on nutrition & aerobic/resistive exercise along with prescribed  medications to achieve LDL 70mg , HDL >40mg .    Expected Outcomes  Short Term: Participant states understanding of desired cholesterol values and is compliant with medications prescribed. Participant is following  exercise prescription and nutrition guidelines.;Long Term: Cholesterol controlled with medications as prescribed, with individualized exercise RX and with personalized nutrition plan. Value goals: LDL < 70mg , HDL > 40 mg.           Core Components/Risk Factors/Patient Goals Review:  Goals and Risk Factor Review    Core Components/Risk Factors/Patient Goals Review    Row Name 08/20/17 1217   Personal Goals Review  Weight Management/Obesity;Hypertension;Lipids   Review  pt with CAD RF demonstrates willingness to participate in CR program. pt personal goal to resume pleasure activities including yardwork and playing golf. pt also eager to finish healing process and restore health. pt encouraged to participate in CR exercise, nutrition and lifestyle modification opporunities.    Expected Outcomes  pt will participate in CR exercise, nutrition and lifestyle modification to decrease overall RF.           Core Components/Risk Factors/Patient Goals at Discharge (Final Review):  Goals and Risk Factor Review - 08/20/17 1217    Core Components/Risk Factors/Patient Goals Review          Personal Goals Review  Weight Management/Obesity;Hypertension;Lipids    Review  pt with CAD RF demonstrates willingness to participate in CR program. pt personal goal to resume pleasure activities including yardwork and playing golf. pt also eager to finish healing process and restore health. pt encouraged to participate in CR exercise, nutrition and lifestyle modification opporunities.     Expected Outcomes  pt will participate in CR exercise, nutrition and lifestyle modification to decrease overall RF.            ITP Comments: ITP Comments    Row Name 08/12/17 0934 08/20/17 1215   ITP Comments  Dr. Armanda Magic, Medical Director  pt started group exercise program. pt tolerated light activity without difficulty. pt oriented to exercise equipment and safety routine.        Comments:

## 2017-08-21 NOTE — Telephone Encounter (Signed)
Patient left a msg on the refill vm stating that he was seen here earlier this week and at that visit, he requested an rx for carvedilol be sent to [facility]. He states that they never received the order. Per epic, the rx was printed. I will resend this for the patient.

## 2017-08-22 ENCOUNTER — Encounter (HOSPITAL_COMMUNITY)
Admission: RE | Admit: 2017-08-22 | Discharge: 2017-08-22 | Disposition: A | Discharge status: Home / Self Care | Payer: Medicare HMO | Source: Ambulatory Visit | Attending: Interventional Cardiology | Admitting: Interventional Cardiology

## 2017-08-22 DIAGNOSIS — I2510 Atherosclerotic heart disease of native coronary artery without angina pectoris: Secondary | ICD-10-CM | POA: Diagnosis not present

## 2017-08-22 DIAGNOSIS — D696 Thrombocytopenia, unspecified: Secondary | ICD-10-CM | POA: Diagnosis not present

## 2017-08-22 DIAGNOSIS — Z951 Presence of aortocoronary bypass graft: Secondary | ICD-10-CM

## 2017-08-22 DIAGNOSIS — Z79890 Hormone replacement therapy (postmenopausal): Secondary | ICD-10-CM | POA: Diagnosis not present

## 2017-08-22 DIAGNOSIS — Z79899 Other long term (current) drug therapy: Secondary | ICD-10-CM | POA: Diagnosis not present

## 2017-08-22 DIAGNOSIS — E039 Hypothyroidism, unspecified: Secondary | ICD-10-CM | POA: Diagnosis not present

## 2017-08-22 DIAGNOSIS — Z7984 Long term (current) use of oral hypoglycemic drugs: Secondary | ICD-10-CM | POA: Diagnosis not present

## 2017-08-22 DIAGNOSIS — I10 Essential (primary) hypertension: Secondary | ICD-10-CM | POA: Diagnosis not present

## 2017-08-22 DIAGNOSIS — Z7982 Long term (current) use of aspirin: Secondary | ICD-10-CM | POA: Diagnosis not present

## 2017-08-22 NOTE — Progress Notes (Signed)
Wayne Mitchell 82 y.o. male Nutrition Note Spoke with pt. Nutrition Plan and Nutrition Survey goals reviewed with pt. Pt is following a Heart Healthy diet. Pt reports being told has pre-diabetes. Last A1c 5.6, discussed carbohydrate consistent heart healthy meal plan today. Discussed label reading for carbohydrates/sugar/fiber, sodium and saturated fat and distributed handout to patient. Discussed what building a healthy plate would look like at meals. Per discussion, pt does not use canned/convenience foods often. Pt rarely adds salt to food. Pt eats out infrequently. Pt shared that his wife does all of the cooking, recommended pt share information discussed today with wife, and invited her to come in and speak with writer to help aide in following consistent carbohydrate heart healthy meal plan. Pt expressed understanding of the information reviewed. Pt aware of nutrition education classes offered and plans on attending nutrition classes    Lab Results  Component Value Date   HGBA1C 5.6 05/31/2017    Wt Readings from Last 3 Encounters:  08/18/17 148 lb 6.4 oz (67.3 kg)  08/12/17 149 lb 0.5 oz (67.6 kg)  07/14/17 156 lb (70.8 kg)    Nutrition Diagnosis  Food-and nutrition-related knowledge deficit related to lack of exposure to information as related to diagnosis of: ? CVD ? Pre-DM    Nutrition Intervention ? Pt's individual nutrition plan reviewed with pt. ? Benefits of adopting Heart Healthy diet discussed when [employer] reviewed.  Goal(s)  ? Pt to identify and limit food sources of saturated fat, trans fat, and sodium ? Pt to identify and increase food sources of complex carbohydrates. ? Pt to watch out for sweets and added sugar. ? Pt able to name foods that affect blood glucose.  Plan:  Pt to attend nutrition classes ? Nutrition I ? Nutrition II ? Portion Distortion  Will provide client-centered nutrition education as part of interdisciplinary care.   Monitor and evaluate  progress toward nutrition goal with team.   Ross Marcus, MS, RD, LDN 08/22/2017 9:24 AM

## 2017-08-25 ENCOUNTER — Encounter (HOSPITAL_COMMUNITY)
Admission: RE | Admit: 2017-08-25 | Discharge: 2017-08-25 | Disposition: A | Discharge status: Home / Self Care | Payer: Medicare HMO | Source: Ambulatory Visit | Attending: Cardiology | Admitting: Cardiology

## 2017-08-25 DIAGNOSIS — Z79899 Other long term (current) drug therapy: Secondary | ICD-10-CM | POA: Diagnosis not present

## 2017-08-25 DIAGNOSIS — Z7984 Long term (current) use of oral hypoglycemic drugs: Secondary | ICD-10-CM | POA: Diagnosis not present

## 2017-08-25 DIAGNOSIS — Z951 Presence of aortocoronary bypass graft: Secondary | ICD-10-CM | POA: Diagnosis not present

## 2017-08-25 DIAGNOSIS — E039 Hypothyroidism, unspecified: Secondary | ICD-10-CM | POA: Diagnosis not present

## 2017-08-25 DIAGNOSIS — D696 Thrombocytopenia, unspecified: Secondary | ICD-10-CM | POA: Diagnosis not present

## 2017-08-25 DIAGNOSIS — Z7982 Long term (current) use of aspirin: Secondary | ICD-10-CM | POA: Diagnosis not present

## 2017-08-25 DIAGNOSIS — Z79890 Hormone replacement therapy (postmenopausal): Secondary | ICD-10-CM | POA: Diagnosis not present

## 2017-08-25 DIAGNOSIS — I10 Essential (primary) hypertension: Secondary | ICD-10-CM | POA: Diagnosis not present

## 2017-08-25 DIAGNOSIS — I2510 Atherosclerotic heart disease of native coronary artery without angina pectoris: Secondary | ICD-10-CM | POA: Diagnosis not present

## 2017-08-26 ENCOUNTER — Ambulatory Visit (HOSPITAL_COMMUNITY): Payer: Medicare HMO

## 2017-08-27 ENCOUNTER — Encounter (HOSPITAL_COMMUNITY)
Admission: RE | Admit: 2017-08-27 | Discharge: 2017-08-27 | Disposition: A | Discharge status: Home / Self Care | Payer: Medicare HMO | Source: Ambulatory Visit | Attending: Interventional Cardiology | Admitting: Interventional Cardiology

## 2017-08-27 DIAGNOSIS — Z79899 Other long term (current) drug therapy: Secondary | ICD-10-CM | POA: Diagnosis not present

## 2017-08-27 DIAGNOSIS — D696 Thrombocytopenia, unspecified: Secondary | ICD-10-CM | POA: Diagnosis not present

## 2017-08-27 DIAGNOSIS — Z7982 Long term (current) use of aspirin: Secondary | ICD-10-CM | POA: Diagnosis not present

## 2017-08-27 DIAGNOSIS — E039 Hypothyroidism, unspecified: Secondary | ICD-10-CM | POA: Diagnosis not present

## 2017-08-27 DIAGNOSIS — I2510 Atherosclerotic heart disease of native coronary artery without angina pectoris: Secondary | ICD-10-CM | POA: Diagnosis not present

## 2017-08-27 DIAGNOSIS — Z7984 Long term (current) use of oral hypoglycemic drugs: Secondary | ICD-10-CM | POA: Diagnosis not present

## 2017-08-27 DIAGNOSIS — Z951 Presence of aortocoronary bypass graft: Secondary | ICD-10-CM | POA: Diagnosis not present

## 2017-08-27 DIAGNOSIS — I10 Essential (primary) hypertension: Secondary | ICD-10-CM | POA: Diagnosis not present

## 2017-08-27 DIAGNOSIS — Z79890 Hormone replacement therapy (postmenopausal): Secondary | ICD-10-CM | POA: Diagnosis not present

## 2017-08-29 ENCOUNTER — Encounter (HOSPITAL_COMMUNITY)
Admission: RE | Admit: 2017-08-29 | Discharge: 2017-08-29 | Disposition: A | Discharge status: Home / Self Care | Payer: Medicare HMO | Source: Ambulatory Visit | Attending: Interventional Cardiology | Admitting: Interventional Cardiology

## 2017-08-29 DIAGNOSIS — E039 Hypothyroidism, unspecified: Secondary | ICD-10-CM | POA: Diagnosis not present

## 2017-08-29 DIAGNOSIS — Z7982 Long term (current) use of aspirin: Secondary | ICD-10-CM | POA: Diagnosis not present

## 2017-08-29 DIAGNOSIS — D696 Thrombocytopenia, unspecified: Secondary | ICD-10-CM | POA: Diagnosis not present

## 2017-08-29 DIAGNOSIS — Z951 Presence of aortocoronary bypass graft: Secondary | ICD-10-CM | POA: Diagnosis not present

## 2017-08-29 DIAGNOSIS — I10 Essential (primary) hypertension: Secondary | ICD-10-CM | POA: Diagnosis not present

## 2017-08-29 DIAGNOSIS — I2510 Atherosclerotic heart disease of native coronary artery without angina pectoris: Secondary | ICD-10-CM | POA: Diagnosis not present

## 2017-08-29 DIAGNOSIS — Z79890 Hormone replacement therapy (postmenopausal): Secondary | ICD-10-CM | POA: Diagnosis not present

## 2017-08-29 DIAGNOSIS — Z7984 Long term (current) use of oral hypoglycemic drugs: Secondary | ICD-10-CM | POA: Diagnosis not present

## 2017-08-29 DIAGNOSIS — Z79899 Other long term (current) drug therapy: Secondary | ICD-10-CM | POA: Diagnosis not present

## 2017-08-31 NOTE — Telephone Encounter (Signed)
Chart accessed to complete cardiac rehab calls

## 2017-09-01 ENCOUNTER — Encounter (HOSPITAL_COMMUNITY)
Admission: RE | Admit: 2017-09-01 | Discharge: 2017-09-01 | Disposition: A | Discharge status: Home / Self Care | Payer: Medicare HMO | Source: Ambulatory Visit | Attending: Interventional Cardiology | Admitting: Interventional Cardiology

## 2017-09-01 DIAGNOSIS — I10 Essential (primary) hypertension: Secondary | ICD-10-CM | POA: Diagnosis not present

## 2017-09-01 DIAGNOSIS — E039 Hypothyroidism, unspecified: Secondary | ICD-10-CM | POA: Diagnosis not present

## 2017-09-01 DIAGNOSIS — D696 Thrombocytopenia, unspecified: Secondary | ICD-10-CM | POA: Diagnosis not present

## 2017-09-01 DIAGNOSIS — Z951 Presence of aortocoronary bypass graft: Secondary | ICD-10-CM

## 2017-09-01 DIAGNOSIS — Z79899 Other long term (current) drug therapy: Secondary | ICD-10-CM | POA: Diagnosis not present

## 2017-09-01 DIAGNOSIS — I2510 Atherosclerotic heart disease of native coronary artery without angina pectoris: Secondary | ICD-10-CM | POA: Diagnosis not present

## 2017-09-01 DIAGNOSIS — Z7982 Long term (current) use of aspirin: Secondary | ICD-10-CM | POA: Diagnosis not present

## 2017-09-01 DIAGNOSIS — Z7984 Long term (current) use of oral hypoglycemic drugs: Secondary | ICD-10-CM | POA: Diagnosis not present

## 2017-09-01 DIAGNOSIS — Z79890 Hormone replacement therapy (postmenopausal): Secondary | ICD-10-CM | POA: Diagnosis not present

## 2017-09-03 ENCOUNTER — Encounter (HOSPITAL_COMMUNITY)
Admission: RE | Admit: 2017-09-03 | Discharge: 2017-09-03 | Disposition: A | Discharge status: Home / Self Care | Payer: Medicare HMO | Source: Ambulatory Visit | Attending: Interventional Cardiology | Admitting: Interventional Cardiology

## 2017-09-03 DIAGNOSIS — Z7982 Long term (current) use of aspirin: Secondary | ICD-10-CM | POA: Diagnosis not present

## 2017-09-03 DIAGNOSIS — Z951 Presence of aortocoronary bypass graft: Secondary | ICD-10-CM | POA: Diagnosis not present

## 2017-09-03 DIAGNOSIS — D696 Thrombocytopenia, unspecified: Secondary | ICD-10-CM | POA: Diagnosis not present

## 2017-09-03 DIAGNOSIS — Z79890 Hormone replacement therapy (postmenopausal): Secondary | ICD-10-CM | POA: Diagnosis not present

## 2017-09-03 DIAGNOSIS — I10 Essential (primary) hypertension: Secondary | ICD-10-CM | POA: Diagnosis not present

## 2017-09-03 DIAGNOSIS — I2510 Atherosclerotic heart disease of native coronary artery without angina pectoris: Secondary | ICD-10-CM | POA: Diagnosis not present

## 2017-09-03 DIAGNOSIS — E039 Hypothyroidism, unspecified: Secondary | ICD-10-CM | POA: Diagnosis not present

## 2017-09-03 DIAGNOSIS — Z7984 Long term (current) use of oral hypoglycemic drugs: Secondary | ICD-10-CM | POA: Diagnosis not present

## 2017-09-03 DIAGNOSIS — Z79899 Other long term (current) drug therapy: Secondary | ICD-10-CM | POA: Diagnosis not present

## 2017-09-03 NOTE — Progress Notes (Signed)
Reviewed home exercise with pt today.  Pt plans to walk for exercise. Pt will start with 15-20 minutes and progressively work up to 30 minutes, 2x/week in addition to coming to cardiac rehab. Reviewed THR, pulse, RPE, sign and symptoms, NTG use, and when to call 911 or MD.  Also discussed weather considerations and indoor options.  Pt voiced understanding.    Ally [employer]

## 2017-09-05 ENCOUNTER — Encounter (HOSPITAL_COMMUNITY)
Admission: RE | Admit: 2017-09-05 | Discharge: 2017-09-05 | Disposition: A | Discharge status: Home / Self Care | Payer: Medicare HMO | Source: Ambulatory Visit | Attending: Interventional Cardiology | Admitting: Interventional Cardiology

## 2017-09-05 DIAGNOSIS — Z79890 Hormone replacement therapy (postmenopausal): Secondary | ICD-10-CM | POA: Diagnosis not present

## 2017-09-05 DIAGNOSIS — I10 Essential (primary) hypertension: Secondary | ICD-10-CM | POA: Diagnosis not present

## 2017-09-05 DIAGNOSIS — Z951 Presence of aortocoronary bypass graft: Secondary | ICD-10-CM

## 2017-09-05 DIAGNOSIS — D696 Thrombocytopenia, unspecified: Secondary | ICD-10-CM | POA: Diagnosis not present

## 2017-09-05 DIAGNOSIS — I2510 Atherosclerotic heart disease of native coronary artery without angina pectoris: Secondary | ICD-10-CM | POA: Diagnosis not present

## 2017-09-05 DIAGNOSIS — E039 Hypothyroidism, unspecified: Secondary | ICD-10-CM | POA: Diagnosis not present

## 2017-09-05 DIAGNOSIS — Z79899 Other long term (current) drug therapy: Secondary | ICD-10-CM | POA: Diagnosis not present

## 2017-09-05 DIAGNOSIS — Z7984 Long term (current) use of oral hypoglycemic drugs: Secondary | ICD-10-CM | POA: Diagnosis not present

## 2017-09-05 DIAGNOSIS — Z7982 Long term (current) use of aspirin: Secondary | ICD-10-CM | POA: Diagnosis not present

## 2017-09-08 ENCOUNTER — Encounter (HOSPITAL_COMMUNITY)
Admission: RE | Admit: 2017-09-08 | Discharge: 2017-09-08 | Disposition: A | Discharge status: Home / Self Care | Payer: Medicare HMO | Source: Ambulatory Visit | Attending: Interventional Cardiology | Admitting: Interventional Cardiology

## 2017-09-08 DIAGNOSIS — Z7984 Long term (current) use of oral hypoglycemic drugs: Secondary | ICD-10-CM | POA: Diagnosis not present

## 2017-09-08 DIAGNOSIS — I2510 Atherosclerotic heart disease of native coronary artery without angina pectoris: Secondary | ICD-10-CM | POA: Diagnosis not present

## 2017-09-08 DIAGNOSIS — Z79890 Hormone replacement therapy (postmenopausal): Secondary | ICD-10-CM | POA: Diagnosis not present

## 2017-09-08 DIAGNOSIS — D696 Thrombocytopenia, unspecified: Secondary | ICD-10-CM | POA: Diagnosis not present

## 2017-09-08 DIAGNOSIS — Z951 Presence of aortocoronary bypass graft: Secondary | ICD-10-CM | POA: Diagnosis not present

## 2017-09-08 DIAGNOSIS — I10 Essential (primary) hypertension: Secondary | ICD-10-CM | POA: Diagnosis not present

## 2017-09-08 DIAGNOSIS — Z7982 Long term (current) use of aspirin: Secondary | ICD-10-CM | POA: Diagnosis not present

## 2017-09-08 DIAGNOSIS — E039 Hypothyroidism, unspecified: Secondary | ICD-10-CM | POA: Diagnosis not present

## 2017-09-08 DIAGNOSIS — Z79899 Other long term (current) drug therapy: Secondary | ICD-10-CM | POA: Diagnosis not present

## 2017-09-10 ENCOUNTER — Encounter (HOSPITAL_COMMUNITY)
Admission: RE | Admit: 2017-09-10 | Discharge: 2017-09-10 | Disposition: A | Discharge status: Home / Self Care | Payer: Medicare HMO | Source: Ambulatory Visit | Attending: Interventional Cardiology | Admitting: Interventional Cardiology

## 2017-09-10 DIAGNOSIS — E039 Hypothyroidism, unspecified: Secondary | ICD-10-CM | POA: Diagnosis not present

## 2017-09-10 DIAGNOSIS — Z7982 Long term (current) use of aspirin: Secondary | ICD-10-CM | POA: Diagnosis not present

## 2017-09-10 DIAGNOSIS — Z79890 Hormone replacement therapy (postmenopausal): Secondary | ICD-10-CM | POA: Diagnosis not present

## 2017-09-10 DIAGNOSIS — I2510 Atherosclerotic heart disease of native coronary artery without angina pectoris: Secondary | ICD-10-CM | POA: Diagnosis not present

## 2017-09-10 DIAGNOSIS — D696 Thrombocytopenia, unspecified: Secondary | ICD-10-CM | POA: Diagnosis not present

## 2017-09-10 DIAGNOSIS — Z79899 Other long term (current) drug therapy: Secondary | ICD-10-CM | POA: Diagnosis not present

## 2017-09-10 DIAGNOSIS — Z951 Presence of aortocoronary bypass graft: Secondary | ICD-10-CM

## 2017-09-10 DIAGNOSIS — Z7984 Long term (current) use of oral hypoglycemic drugs: Secondary | ICD-10-CM | POA: Diagnosis not present

## 2017-09-10 DIAGNOSIS — I10 Essential (primary) hypertension: Secondary | ICD-10-CM | POA: Diagnosis not present

## 2017-09-12 ENCOUNTER — Encounter (HOSPITAL_COMMUNITY)
Admission: RE | Admit: 2017-09-12 | Discharge: 2017-09-12 | Disposition: A | Discharge status: Home / Self Care | Payer: Medicare HMO | Source: Ambulatory Visit | Attending: Interventional Cardiology | Admitting: Interventional Cardiology

## 2017-09-12 DIAGNOSIS — H35372 Puckering of macula, left eye: Secondary | ICD-10-CM | POA: Diagnosis not present

## 2017-09-12 DIAGNOSIS — I2510 Atherosclerotic heart disease of native coronary artery without angina pectoris: Secondary | ICD-10-CM | POA: Insufficient documentation

## 2017-09-12 DIAGNOSIS — Z79890 Hormone replacement therapy (postmenopausal): Secondary | ICD-10-CM | POA: Insufficient documentation

## 2017-09-12 DIAGNOSIS — Z7984 Long term (current) use of oral hypoglycemic drugs: Secondary | ICD-10-CM | POA: Diagnosis not present

## 2017-09-12 DIAGNOSIS — Z7982 Long term (current) use of aspirin: Secondary | ICD-10-CM | POA: Diagnosis not present

## 2017-09-12 DIAGNOSIS — H43813 Vitreous degeneration, bilateral: Secondary | ICD-10-CM | POA: Diagnosis not present

## 2017-09-12 DIAGNOSIS — E039 Hypothyroidism, unspecified: Secondary | ICD-10-CM | POA: Diagnosis not present

## 2017-09-12 DIAGNOSIS — H02401 Unspecified ptosis of right eyelid: Secondary | ICD-10-CM | POA: Diagnosis not present

## 2017-09-12 DIAGNOSIS — I10 Essential (primary) hypertension: Secondary | ICD-10-CM | POA: Diagnosis not present

## 2017-09-12 DIAGNOSIS — Z951 Presence of aortocoronary bypass graft: Secondary | ICD-10-CM | POA: Diagnosis not present

## 2017-09-12 DIAGNOSIS — Z79899 Other long term (current) drug therapy: Secondary | ICD-10-CM | POA: Diagnosis not present

## 2017-09-12 DIAGNOSIS — D696 Thrombocytopenia, unspecified: Secondary | ICD-10-CM | POA: Diagnosis not present

## 2017-09-12 DIAGNOSIS — H524 Presbyopia: Secondary | ICD-10-CM | POA: Diagnosis not present

## 2017-09-15 ENCOUNTER — Encounter (HOSPITAL_COMMUNITY)
Admission: RE | Admit: 2017-09-15 | Discharge: 2017-09-15 | Disposition: A | Discharge status: Home / Self Care | Payer: Medicare HMO | Source: Ambulatory Visit | Attending: Interventional Cardiology | Admitting: Interventional Cardiology

## 2017-09-15 DIAGNOSIS — D696 Thrombocytopenia, unspecified: Secondary | ICD-10-CM | POA: Diagnosis not present

## 2017-09-15 DIAGNOSIS — E039 Hypothyroidism, unspecified: Secondary | ICD-10-CM | POA: Diagnosis not present

## 2017-09-15 DIAGNOSIS — I2510 Atherosclerotic heart disease of native coronary artery without angina pectoris: Secondary | ICD-10-CM | POA: Diagnosis not present

## 2017-09-15 DIAGNOSIS — Z79899 Other long term (current) drug therapy: Secondary | ICD-10-CM | POA: Diagnosis not present

## 2017-09-15 DIAGNOSIS — Z951 Presence of aortocoronary bypass graft: Secondary | ICD-10-CM

## 2017-09-15 DIAGNOSIS — Z7984 Long term (current) use of oral hypoglycemic drugs: Secondary | ICD-10-CM | POA: Diagnosis not present

## 2017-09-15 DIAGNOSIS — Z79890 Hormone replacement therapy (postmenopausal): Secondary | ICD-10-CM | POA: Diagnosis not present

## 2017-09-15 DIAGNOSIS — I10 Essential (primary) hypertension: Secondary | ICD-10-CM | POA: Diagnosis not present

## 2017-09-15 DIAGNOSIS — Z7982 Long term (current) use of aspirin: Secondary | ICD-10-CM | POA: Diagnosis not present

## 2017-09-16 ENCOUNTER — Encounter (HOSPITAL_COMMUNITY): Payer: Self-pay

## 2017-09-17 ENCOUNTER — Encounter (HOSPITAL_COMMUNITY)
Admission: RE | Admit: 2017-09-17 | Discharge: 2017-09-17 | Disposition: A | Discharge status: Home / Self Care | Payer: Medicare HMO | Source: Ambulatory Visit | Attending: Interventional Cardiology | Admitting: Interventional Cardiology

## 2017-09-17 DIAGNOSIS — Z7982 Long term (current) use of aspirin: Secondary | ICD-10-CM | POA: Diagnosis not present

## 2017-09-17 DIAGNOSIS — I2510 Atherosclerotic heart disease of native coronary artery without angina pectoris: Secondary | ICD-10-CM | POA: Diagnosis not present

## 2017-09-17 DIAGNOSIS — Z79890 Hormone replacement therapy (postmenopausal): Secondary | ICD-10-CM | POA: Diagnosis not present

## 2017-09-17 DIAGNOSIS — D696 Thrombocytopenia, unspecified: Secondary | ICD-10-CM | POA: Diagnosis not present

## 2017-09-17 DIAGNOSIS — Z951 Presence of aortocoronary bypass graft: Secondary | ICD-10-CM

## 2017-09-17 DIAGNOSIS — E039 Hypothyroidism, unspecified: Secondary | ICD-10-CM | POA: Diagnosis not present

## 2017-09-17 DIAGNOSIS — Z7984 Long term (current) use of oral hypoglycemic drugs: Secondary | ICD-10-CM | POA: Diagnosis not present

## 2017-09-17 DIAGNOSIS — Z79899 Other long term (current) drug therapy: Secondary | ICD-10-CM | POA: Diagnosis not present

## 2017-09-17 DIAGNOSIS — I10 Essential (primary) hypertension: Secondary | ICD-10-CM | POA: Diagnosis not present

## 2017-09-17 NOTE — Progress Notes (Signed)
Cardiac Individual Treatment Plan  Patient Details  Name: SHAFER SWAMY MRN: [id number] Date of Birth: 1935/08/14 Referring Provider:   Flowsheet Row CARDIAC REHAB PHASE II ORIENTATION from 08/12/2017 in [facility] CARDIAC REHAB  Referring Provider  Verdis Prime MD      Initial Encounter Date:  Flowsheet Row CARDIAC REHAB PHASE II ORIENTATION from 08/12/2017 in [facility] CARDIAC REHAB  Date  08/12/17      Visit Diagnosis: 06/02/17 S/P CABG x 3  Patient's Home Medications on Admission:  Current Outpatient Medications:  .  acetaminophen (TYLENOL) 325 MG tablet, Take 2 tablets (650 mg total) by mouth every 6 (six) hours as needed for mild pain., Disp: , Rfl:  .  aspirin EC 81 MG tablet, Take 81 mg by mouth See admin instructions. Twice monthly... Pt takes 1 pill on the 1st and 1 pill on the 15th, Disp: , Rfl:  .  atorvastatin (LIPITOR) 20 MG tablet, Take 20 mg by mouth daily., Disp: , Rfl:  .  carvedilol (COREG) 3.125 MG tablet, Take 1 tablet (3.125 mg total) by mouth daily., Disp: 90 tablet, Rfl: 3 .  levothyroxine (SYNTHROID, LEVOTHROID) 112 MCG tablet, Take 112 mcg by mouth daily before breakfast., Disp: , Rfl:   Past Medical History: Past Medical History:  Diagnosis Date  . Carotid artery disease (HCC)   . Colon polyp    small  . Coronary artery disease   . Coronary artery disease involving native coronary artery of native heart with unstable angina pectoris (HCC) 05/30/2017   3V CABG 06/02/17  . HTN (hypertension)   . Hypothyroidism   . Syncope   . Syncope   . Thrombocytopenia (HCC)     Tobacco Use: Social History   Tobacco Use  Smoking Status Never Smoker  Smokeless Tobacco Never Used    Labs: Recent Review Flowsheet Data    Labs for ITP Cardiac and Pulmonary Rehab Latest Ref Rng & Units 06/04/2017 06/05/2017 06/05/2017 06/05/2017 06/06/2017   Cholestrol 0 - 200 mg/dL - - - - -   LDLCALC 0 - 99 mg/dL - - - - -   HDL >44 mg/dL - -  - - -   Trlycerides <150 mg/dL - - - - -   Hemoglobin A1c 4.8 - 5.6 % - - - - -   PHART 7.350 - 7.450 - - 7.436 - -   PCO2ART 32.0 - 48.0 mmHg - - 34.8 - -   HCO3 20.0 - 28.0 mmol/L - - 23.5 - -   TCO2 22 - 32 mmol/L - - 25 26 -   ACIDBASEDEF 0.0 - 2.0 mmol/L - - 1.0 - -   O2SAT % 46.1 39.8 97.0 - 54.8      Capillary Blood Glucose: Lab Results  Component Value Date   GLUCAP 125 (H) 06/08/2017   GLUCAP 118 (H) 06/08/2017   GLUCAP 106 (H) 06/07/2017   GLUCAP 126 (H) 06/07/2017   GLUCAP 113 (H) 06/07/2017     Exercise Target Goals:    Exercise Program Goal: Individual exercise prescription set using results from initial 6 min walk test and THRR while considering  patient's activity barriers and safety.   Exercise Prescription Goal: Initial exercise prescription builds to 30-45 minutes a day of aerobic activity, 2-3 days per week.  Home exercise guidelines will be given to patient during program as part of exercise prescription that the participant will acknowledge.  Activity Barriers & Risk Stratification: Activity Barriers & Cardiac Risk  Stratification - 08/12/17 1055    Activity Barriers & Cardiac Risk Stratification          Activity Barriers  Deconditioning;Muscular Weakness    Cardiac Risk Stratification  High           6 Minute Walk: 6 Minute Walk    6 Minute Walk    Row Name 08/12/17 1054   Phase  Initial   Distance  1456 feet   Walk Time  6 minutes   # of Rest Breaks  0   MPH  2.76   METS  3.09   RPE  10   VO2 Peak  10.83   Symptoms  No   Resting HR  99 bpm   Resting BP  112/62   Resting Oxygen Saturation   98 %   Exercise Oxygen Saturation  during 6 min walk  98 %   Max Ex. HR  115 bpm   Max Ex. BP  134/78   2 Minute Post BP  130/70          Oxygen Initial Assessment:   Oxygen Re-Evaluation:   Oxygen Discharge (Final Oxygen Re-Evaluation):   Initial Exercise Prescription: Initial Exercise Prescription - 08/12/17 1000    Date of  Initial Exercise RX and Referring Provider          Date  08/12/17    Referring Provider  Verdis Prime MD    Expected Discharge Date  11/12/17        Bike          Level  0.5    Minutes  10    METs  2.4        NuStep          Level  2    SPM  70    Minutes  10    METs  2        Track          Laps  10    Minutes  10    METs  2.74        Prescription Details          Frequency (times per week)  3    Duration  Progress to 30 minutes of continuous aerobic without signs/symptoms of physical distress        Intensity          THRR 40-80% of Max Heartrate  56-113    Ratings of Perceived Exertion  11-13    Perceived Dyspnea  0-4        Progression          Progression  Continue to progress workloads to maintain intensity without signs/symptoms of physical distress.        Resistance Training          Training Prescription  Yes    Weight  2lbs    Reps  10-15           Perform Capillary Blood Glucose checks as needed.  Exercise Prescription Changes: Exercise Prescription Changes    Response to Exercise    Row Name 08/20/17 1504 08/25/17 1612 09/01/17 1600 09/12/17 1200   Blood Pressure (Admit)  144/78  110/58  no documentation  140/68   Blood Pressure (Exercise)  142/62  138/60  no documentation  164/84   Blood Pressure (Exit)  124/80  106/62  no documentation  124/70   Heart Rate (Admit)  95 bpm  89 bpm  no documentation  90 bpm  Heart Rate (Exercise)  111 bpm  114 bpm  no documentation  111 bpm   Heart Rate (Exit)  77 bpm  88 bpm  no documentation  87 bpm   Rating of Perceived Exertion (Exercise)  13  13  no documentation  14   Symptoms  none  none  no documentation  none   Comments  pt oriented to exercise equipment  no documentation  no documentation  no documentation   Duration  Continue with 30 min of aerobic exercise without signs/symptoms of physical distress.  Continue with 30 min of aerobic exercise without signs/symptoms of physical distress.   no documentation  Continue with 30 min of aerobic exercise without signs/symptoms of physical distress.   Intensity  THRR unchanged  THRR unchanged  no documentation  THRR unchanged       Progression    Row Name 08/20/17 1504 08/25/17 1612 09/01/17 1600 09/12/17 1200   Progression  Continue to progress workloads to maintain intensity without signs/symptoms of physical distress.  Continue to progress workloads to maintain intensity without signs/symptoms of physical distress.  no documentation  Continue to progress workloads to maintain intensity without signs/symptoms of physical distress.   Average METs  2.5  2.6  no documentation  3.3       Resistance Training    Row Name 08/20/17 1504 08/25/17 1612 09/01/17 1600 09/12/17 1200   Training Prescription  No relaxation day  Yes  no documentation  Yes   Weight  no documentation  3lbs  no documentation  4lbs   Reps  no documentation  10-15  no documentation  10-15   Time  10 Minutes  10 Minutes  no documentation  10 Minutes       Bike    Row Name 08/20/17 1504 08/25/17 1612 09/01/17 1600 09/12/17 1200   Level  0.5  0.5  no documentation  0.7   Minutes  10  10  no documentation  10   METs  2.4  2.4  no documentation  2.95       NuStep    Row Name 08/20/17 1504 08/25/17 1612 09/01/17 1600 09/12/17 1200   Level  2  2  no documentation  4   SPM  70  80  no documentation  80   Minutes  10  10  no documentation  10   METs  2  2.4  no documentation  3.8       Track    Row Name 08/20/17 1504 08/25/17 1612 09/01/17 1600 09/12/17 1200   Laps  12  10  no documentation  13   Minutes  10  10  no documentation  10   METs  3.09  2.74  no documentation  3.26       Home Exercise Plan    Row Name 08/20/17 1504 08/25/17 1612 09/01/17 1600 09/12/17 1200   Plans to continue exercise at  no documentation  no documentation  no documentation  Home (comment)   Frequency  no documentation  no documentation  no documentation  Add 2 additional days to  program exercise sessions.   Initial Home Exercises Provided  no documentation  no documentation  no documentation  09/03/17          Exercise Comments: Exercise Comments    Row Name 08/21/17 1449 09/12/17 1212   Exercise Comments  Pt completed first session of cardiac rehab. Pt was able to exercise 30 minutes without difficulty. Rehab staff will  monitor pt's activity levels and progress activities as tolerated.   Reviewed METs and goals. Pt is tolerating exercise program very well. Rehab staff will continue to monitor pt's activity levels       Exercise Goals and Review: Exercise Goals    Exercise Goals    Row Name 08/12/17 0935   Increase Physical Activity  Yes   Intervention  Provide advice, education, support and counseling about physical activity/exercise needs.;Develop an individualized exercise prescription for aerobic and resistive training based on initial evaluation findings, risk stratification, comorbidities and participant's personal goals.   Expected Outcomes  Short Term: Attend rehab on a regular basis to increase amount of physical activity.;Long Term: Add in home exercise to make exercise part of routine and to increase amount of physical activity.;Long Term: Exercising regularly at least 3-5 days a week.   Increase Strength and Stamina  Yes   Intervention  Provide advice, education, support and counseling about physical activity/exercise needs.;Develop an individualized exercise prescription for aerobic and resistive training based on initial evaluation findings, risk stratification, comorbidities and participant's personal goals.   Expected Outcomes  Short Term: Increase workloads from initial exercise prescription for resistance, speed, and METs.;Short Term: Perform resistance training exercises routinely during rehab and add in resistance training at home;Long Term: Improve cardiorespiratory fitness, muscular endurance and strength as measured by increased METs and  functional capacity ( )   Able to understand and use rate of perceived exertion (RPE) scale  Yes   Intervention  Provide education and explanation on how to use RPE scale   Expected Outcomes  Long Term:  Able to use RPE to guide intensity level when exercising independently;Short Term: Able to use RPE daily in rehab to express subjective intensity level   Able to understand and use Dyspnea scale  Yes   Intervention  Provide education and explanation on how to use Dyspnea scale   Expected Outcomes  Short Term: Able to use Dyspnea scale daily in rehab to express subjective sense of shortness of breath during exertion;Long Term: Able to use Dyspnea scale to guide intensity level when exercising independently   Knowledge and understanding of Target Heart Rate Range (THRR)  Yes   Intervention  Provide education and explanation of THRR including how the numbers were predicted and where they are located for reference   Expected Outcomes  Short Term: Able to state/look up THRR;Long Term: Able to use THRR to govern intensity when exercising independently;Short Term: Able to use daily as guideline for intensity in rehab   Able to check pulse independently  Yes   Intervention  Provide education and demonstration on how to check pulse in carotid and radial arteries.;Review the importance of being able to check your own pulse for safety during independent exercise   Expected Outcomes  Short Term: Able to explain why pulse checking is important during independent exercise;Long Term: Able to check pulse independently and accurately   Understanding of Exercise Prescription  Yes   Intervention  Provide education, explanation, and written materials on patient's individual exercise prescription   Expected Outcomes  Short Term: Able to explain program exercise prescription;Long Term: Able to explain home exercise prescription to exercise independently          Exercise Goals Re-Evaluation : Exercise Goals  Re-Evaluation    Exercise Goal Re-Evaluation    Row Name 08/20/17 1447 09/03/17 1141   Exercise Goals Review  Increase Physical Activity;Increase Strength and Stamina  Increase Physical Activity;Able to understand and use rate of perceived  exertion (RPE) scale;Knowledge and understanding of Target Heart Rate Range (THRR);Understanding of Exercise Prescription;Increase Strength and Stamina;Able to check pulse independently;Improve claudication pain tolerance and improve walking ability   Comments  Pt was oriented to exercise equipment today. Pt was able to complete exercise session without sings/symptoms of physical distress.   Reviewed HEP in which pt plans to walk at home for 15-20 minutes and slowly progress to 30 minutes. Pt will walk 2x/week in addiiton to coming to cardiac rehab. Pt is also understanding of temperature/emergency precautions.   Expected Outcomes  Pt will be able to exercise 30 minutes without difficulty and improve in cardiorespiratory fitness  Pt will be able to exercise 30 minutes without difficulty and improve in cardiorespiratory fitness           Discharge Exercise Prescription (Final Exercise Prescription Changes): Exercise Prescription Changes - 09/12/17 1200    Response to Exercise          Blood Pressure (Admit)  140/68    Blood Pressure (Exercise)  164/84    Blood Pressure (Exit)  124/70    Heart Rate (Admit)  90 bpm    Heart Rate (Exercise)  111 bpm    Heart Rate (Exit)  87 bpm    Rating of Perceived Exertion (Exercise)  14    Symptoms  none    Duration  Continue with 30 min of aerobic exercise without signs/symptoms of physical distress.    Intensity  THRR unchanged        Progression          Progression  Continue to progress workloads to maintain intensity without signs/symptoms of physical distress.    Average METs  3.3        Resistance Training          Training Prescription  Yes    Weight  4lbs    Reps  10-15    Time  10 Minutes         Bike          Level  0.7    Minutes  10    METs  2.95        NuStep          Level  4    SPM  80    Minutes  10    METs  3.8        Track          Laps  13    Minutes  10    METs  3.26        Home Exercise Plan          Plans to continue exercise at  Home (comment)    Frequency  Add 2 additional days to program exercise sessions.    Initial Home Exercises Provided  09/03/17           Nutrition:  Target Goals: Understanding of nutrition guidelines, daily intake of sodium 1500mg , cholesterol 200mg , calories 30% from fat and 7% or less from saturated fats, daily to have 5 or more servings of fruits and vegetables.  Biometrics: Pre Biometrics - 08/12/17 1058    Pre Biometrics          Height  5\' 7"  (1.702 m)    Weight  149 lb 0.5 oz (67.6 kg)    Waist Circumference  33 inches    Hip Circumference  38.25 inches    Waist to Hip Ratio  0.86 %    BMI (Calculated)  23.34    Triceps Skinfold  23 mm    % Body Fat  24.9 %    Grip Strength  29 kg    Flexibility  0 in    Single Leg Stand  6.35 seconds            Nutrition Therapy Plan and Nutrition Goals: Nutrition Therapy & Goals - 08/12/17 1327    Nutrition Therapy          Diet  consistent carbohydrate heart healthy        Personal Nutrition Goals          Nutrition Goal  identify and limit food sources of saturated fat, trans fat, and sodium    Personal Goal #2  pt able to name foods that affect blood glucose        Intervention Plan          Intervention  Prescribe, educate and counsel regarding individualized specific dietary modifications aiming towards targeted core components such as weight, hypertension, lipid management, diabetes, heart failure and other comorbidities.    Expected Outcomes  Short Term Goal: Understand basic principles of dietary content, such as calories, fat, sodium, cholesterol and nutrients.           Nutrition Assessments: Nutrition Assessments - 08/12/17 1328     MEDFICTS Scores          Pre Score  25           Nutrition Goals Re-Evaluation:   Nutrition Goals Re-Evaluation:   Nutrition Goals Discharge (Final Nutrition Goals Re-Evaluation):   Psychosocial: Target Goals: Acknowledge presence or absence of significant depression and/or stress, maximize coping skills, provide positive support system. Participant is able to verbalize types and ability to use techniques and skills needed for reducing stress and depression.  Initial Review & Psychosocial Screening: Initial Psych Review & Screening - 08/12/17 1006    Initial Review          Current issues with  None Identified        Family Dynamics          Good Support System?  Yes spouse        Barriers          Psychosocial barriers to participate in program  There are no identifiable barriers or psychosocial needs.        Screening Interventions          Interventions  Encouraged to exercise           Quality of Life Scores: Quality of Life - 08/20/17 1227    Quality of Life          Select  Quality of Life        Quality of Life Scores          Health/Function Pre  26.2 %    Socioeconomic Pre  30 %    Psych/Spiritual Pre  29.14 %    GLOBAL Pre  28.21 % scores reviewed with pt. no concerns identified. pt encouraged to participate in CR exercise for overall well being.           Scores of 19 and below usually indicate a poorer quality of life in these areas.  A difference of  2-3 points is a clinically meaningful difference.  A difference of 2-3 points in the total score of the Quality of Life Index has been associated with significant improvement in overall quality of life, self-image, physical symptoms, and general health in studies  assessing change in quality of life.  PHQ-9: Recent Review Flowsheet Data    Depression screen [facility] 2/9 08/20/2017   Decreased Interest 0   Down, Depressed, Hopeless 0   PHQ - 2 Score 0     Interpretation of Total Score  Total  Score Depression Severity:  1-4 = Minimal depression, 5-9 = Mild depression, 10-14 = Moderate depression, 15-19 = Moderately severe depression, 20-27 = Severe depression   Psychosocial Evaluation and Intervention: Psychosocial Evaluation - 08/20/17 1219    Psychosocial Evaluation & Interventions          Interventions  Encouraged to exercise with the program and follow exercise prescription    Comments  no psychosocial needs identified, no interventions necessary. pt enjoys yard work and [employer].     Expected Outcomes  pt will exhibit positive outlook with good coping skills.     Continue Psychosocial Services   No Follow up required           Psychosocial Re-Evaluation: Psychosocial Re-Evaluation    Psychosocial Re-Evaluation    Row Name 08/21/17 [phone] 09/16/17 1135   Current issues with  None Identified  None Identified   Comments  no psychosocial needs identified, no interventions necessary   no psychosocial needs identified, no interventions necessary    Expected Outcomes  pt will exhibit positive outlook with good coping skills.   pt will exhibit positive outlook with good coping skills.    Interventions  Encouraged to attend Cardiac Rehabilitation for the exercise  Encouraged to attend Cardiac Rehabilitation for the exercise   Continue Psychosocial Services   No Follow up required  No Follow up required          Psychosocial Discharge (Final Psychosocial Re-Evaluation): Psychosocial Re-Evaluation - 09/16/17 1135    Psychosocial Re-Evaluation          Current issues with  None Identified    Comments  no psychosocial needs identified, no interventions necessary     Expected Outcomes  pt will exhibit positive outlook with good coping skills.     Interventions  Encouraged to attend Cardiac Rehabilitation for the exercise    Continue Psychosocial Services   No Follow up required           Vocational Rehabilitation: Provide vocational rehab assistance to qualifying  candidates.   Vocational Rehab Evaluation & Intervention: Vocational Rehab - 08/12/17 1006    Initial Vocational Rehab Evaluation & Intervention          Assessment shows need for Vocational Rehabilitation  No retired            Education: Education Goals: Education classes will be provided on a weekly basis, covering required topics. Participant will state understanding/return demonstration of topics presented.  Learning Barriers/Preferences: Learning Barriers/Preferences - 08/12/17 0935    Learning Barriers/Preferences          Learning Barriers  Sight    Learning Preferences  Written Material           Education Topics: Count Your Pulse:  -Group instruction provided by verbal instruction, demonstration, patient participation and written materials to support subject.  Instructors address importance of being able to find your pulse and how to count your pulse when at home without a heart monitor.  Patients get hands on experience counting their pulse with staff help and individually.   Heart Attack, Angina, and Risk Factor Modification:  -Group instruction provided by verbal instruction, video, and written materials to support subject.  Instructors address signs  and symptoms of angina and heart attacks.    Also discuss risk factors for heart disease and how to make changes to improve heart health risk factors. Flowsheet Row CARDIAC REHAB PHASE II EXERCISE from 09/17/2017 in [facility] CARDIAC REHAB  Date  09/17/17  Educator  RN  Instruction Review Code  2- Demonstrated Understanding      Functional Fitness:  -Group instruction provided by verbal instruction, demonstration, patient participation, and written materials to support subject.  Instructors address safety measures for doing things around the house.  Discuss how to get up and down off the floor, how to pick things up properly, how to safely get out of a chair without assistance, and balance  training. Flowsheet Row CARDIAC REHAB PHASE II EXERCISE from 09/17/2017 in [facility] CARDIAC REHAB  Date  08/22/17  Instruction Review Code  2- Demonstrated Understanding      Meditation and Mindfulness:  -Group instruction provided by verbal instruction, patient participation, and written materials to support subject.  Instructor addresses importance of mindfulness and meditation practice to help reduce stress and improve awareness.  Instructor also leads participants through a meditation exercise.  Flowsheet Row CARDIAC REHAB PHASE II EXERCISE from 09/17/2017 in [facility] CARDIAC REHAB  Date  08/27/17  Educator  Theda Belfast  Instruction Review Code  2- Demonstrated Understanding      Stretching for Flexibility and Mobility:  -Group instruction provided by verbal instruction, patient participation, and written materials to support subject.  Instructors lead participants through series of stretches that are designed to increase flexibility thus improving mobility.  These stretches are additional exercise for major muscle groups that are typically performed during regular warm up and cool down.   Hands Only CPR:  -Group verbal, video, and participation provides a basic overview of AHA guidelines for community CPR. Role-play of emergencies allow participants the opportunity to practice calling for help and chest compression technique with discussion of AED use.   Hypertension: -Group verbal and written instruction that provides a basic overview of hypertension including the most recent diagnostic guidelines, risk factor reduction with self-care instructions and medication management. Flowsheet Row CARDIAC REHAB PHASE II EXERCISE from 09/17/2017 in [facility] CARDIAC REHAB  Date  08/29/17  Educator  RN  Instruction Review Code  2- Demonstrated Understanding       Nutrition I class: Heart Healthy Eating:  -Group instruction provided by  PowerPoint slides, verbal discussion, and written materials to support subject matter. The instructor gives an explanation and review of the Therapeutic Lifestyle Changes diet recommendations, which includes a discussion on lipid goals, dietary fat, sodium, fiber, plant stanol/sterol esters, sugar, and the components of a well-balanced, healthy diet.   Nutrition II class: Lifestyle Skills:  -Group instruction provided by PowerPoint slides, verbal discussion, and written materials to support subject matter. The instructor gives an explanation and review of label reading, grocery shopping for heart health, heart healthy recipe modifications, and ways to make healthier choices when eating out.   Diabetes Question & Answer:  -Group instruction provided by PowerPoint slides, verbal discussion, and written materials to support subject matter. The instructor gives an explanation and review of diabetes co-morbidities, pre- and post-prandial blood glucose goals, pre-exercise blood glucose goals, signs, symptoms, and treatment of hypoglycemia and hyperglycemia, and foot care basics.   Diabetes Blitz:  -Group instruction provided by PowerPoint slides, verbal discussion, and written materials to support subject matter. The instructor gives an explanation and review  of the physiology behind type 1 and type 2 diabetes, diabetes medications and rational behind using different medications, pre- and post-prandial blood glucose recommendations and Hemoglobin A1c goals, diabetes diet, and exercise including blood glucose guidelines for exercising safely.    Portion Distortion:  -Group instruction provided by PowerPoint slides, verbal discussion, written materials, and food models to support subject matter. The instructor gives an explanation of serving size versus portion size, changes in portions sizes over the last 20 years, and what consists of a serving from each food group.   Stress Management:  -Group  instruction provided by verbal instruction, video, and written materials to support subject matter.  Instructors review role of stress in heart disease and how to cope with stress positively.   Flowsheet Row CARDIAC REHAB PHASE II EXERCISE from 09/17/2017 in [facility] CARDIAC REHAB  Date  09/03/17  Educator  RN  Instruction Review Code  2- Demonstrated Understanding      Exercising on Your Own:  -Group instruction provided by verbal instruction, power point, and written materials to support subject.  Instructors discuss benefits of exercise, components of exercise, frequency and intensity of exercise, and end points for exercise.  Also discuss use of nitroglycerin and activating EMS.  Review options of places to exercise outside of rehab.  Review guidelines for sex with heart disease.   Cardiac Drugs I:  -Group instruction provided by verbal instruction and written materials to support subject.  Instructor reviews cardiac drug classes: antiplatelets, anticoagulants, beta blockers, and statins.  Instructor discusses reasons, side effects, and lifestyle considerations for each drug class.   Cardiac Drugs II:  -Group instruction provided by verbal instruction and written materials to support subject.  Instructor reviews cardiac drug classes: angiotensin converting enzyme inhibitors (ACE-I), angiotensin II receptor blockers (ARBs), nitrates, and calcium channel blockers.  Instructor discusses reasons, side effects, and lifestyle considerations for each drug class.   Anatomy and Physiology of the Circulatory System:  Group verbal and written instruction and models provide basic cardiac anatomy and physiology, with the coronary electrical and arterial systems. Review of: AMI, Angina, Valve disease, Heart Failure, Peripheral Artery Disease, Cardiac Arrhythmia, Pacemakers, and the ICD. Flowsheet Row CARDIAC REHAB PHASE II EXERCISE from 09/17/2017 in [facility] CARDIAC  REHAB  Date  09/10/17  Educator  RN  Instruction Review Code  2- Demonstrated Understanding      Other Education:  -Group or individual verbal, written, or video instructions that support the educational goals of the cardiac rehab program.   Holiday Eating Survival Tips:  -Group instruction provided by PowerPoint slides, verbal discussion, and written materials to support subject matter. The instructor gives patients tips, tricks, and techniques to help them not only survive but enjoy the holidays despite the onslaught of food that accompanies the holidays.   Knowledge Questionnaire Score: Knowledge Questionnaire Score - 08/12/17 1006    Knowledge Questionnaire Score          Pre Score  20/24           Core Components/Risk Factors/Patient Goals at Admission: Personal Goals and Risk Factors at Admission - 08/12/17 0936    Core Components/Risk Factors/Patient Goals on Admission           Weight Management  Yes;Weight Maintenance    Intervention  Weight Management: Develop a combined nutrition and exercise program designed to reach desired caloric intake, while maintaining appropriate intake of nutrient and fiber, sodium and fats, and appropriate energy expenditure required for the  weight goal.;Weight Management: Provide education and appropriate resources to help participant work on and attain dietary goals.    Admit Weight  149 lb 0.5 oz (67.6 kg)    Goal Weight: Short Term  149 lb (67.6 kg)    Goal Weight: Long Term  149 lb (67.6 kg)    Expected Outcomes  Short Term: Continue to assess and modify interventions until short term weight is achieved;Long Term: Adherence to nutrition and physical activity/exercise program aimed toward attainment of established weight goal;Weight Maintenance: Understanding of the daily nutrition guidelines, which includes 25-35% calories from fat, 7% or less cal from saturated fats, less than 200mg  cholesterol, less than 1.5gm of sodium, & 5 or more  servings of fruits and vegetables daily;Understanding recommendations for meals to include 15-35% energy as protein, 25-35% energy from fat, 35-60% energy from carbohydrates, less than 200mg  of dietary cholesterol, 20-35 gm of total fiber daily;Understanding of distribution of calorie intake throughout the day with the consumption of 4-5 meals/snacks    Hypertension  Yes    Intervention  Provide education on lifestyle modifcations including regular physical activity/exercise, weight management, moderate sodium restriction and increased consumption of fresh fruit, vegetables, and low fat dairy, alcohol moderation, and smoking cessation.;Monitor prescription use compliance.    Expected Outcomes  Short Term: Continued assessment and intervention until BP is < 140/51mm HG in hypertensive participants. < 130/56mm HG in hypertensive participants with diabetes, heart failure or chronic kidney disease.;Long Term: Maintenance of blood pressure at goal levels.    Lipids  Yes    Intervention  Provide education and support for participant on nutrition & aerobic/resistive exercise along with prescribed medications to achieve LDL 70mg , HDL >40mg .    Expected Outcomes  Short Term: Participant states understanding of desired cholesterol values and is compliant with medications prescribed. Participant is following exercise prescription and nutrition guidelines.;Long Term: Cholesterol controlled with medications as prescribed, with individualized exercise RX and with personalized nutrition plan. Value goals: LDL < 70mg , HDL > 40 mg.           Core Components/Risk Factors/Patient Goals Review:  Goals and Risk Factor Review    Core Components/Risk Factors/Patient Goals Review    Row Name 08/20/17 1217 09/16/17 1134   Personal Goals Review  Weight Management/Obesity;Hypertension;Lipids  Weight Management/Obesity;Hypertension;Lipids   Review  pt with CAD RF demonstrates willingness to participate in CR program. pt  personal goal to resume pleasure activities including yardwork and playing golf. pt also eager to finish healing process and restore health. pt encouraged to participate in CR exercise, nutrition and lifestyle modification opporunities.   no documentation   Expected Outcomes  pt will participate in CR exercise, nutrition and lifestyle modification to decrease overall RF.   no documentation          Core Components/Risk Factors/Patient Goals at Discharge (Final Review):  Goals and Risk Factor Review - 09/16/17 1134    Core Components/Risk Factors/Patient Goals Review          Personal Goals Review  Weight Management/Obesity;Hypertension;Lipids           ITP Comments: ITP Comments    Row Name 08/12/17 0934 08/20/17 1215 09/16/17 1133   ITP Comments  Dr. Armanda Magic, Medical Director  pt started group exercise program. pt tolerated light activity without difficulty. pt oriented to exercise equipment and safety routine.    30 day ITP review. pt with good attendance and participation. pt demonstrates willingness to participate in CR activiites.  Comments:

## 2017-09-19 ENCOUNTER — Encounter (HOSPITAL_COMMUNITY)
Admission: RE | Admit: 2017-09-19 | Discharge: 2017-09-19 | Disposition: A | Discharge status: Home / Self Care | Payer: Medicare HMO | Source: Ambulatory Visit | Attending: Interventional Cardiology | Admitting: Interventional Cardiology

## 2017-09-19 DIAGNOSIS — D696 Thrombocytopenia, unspecified: Secondary | ICD-10-CM | POA: Diagnosis not present

## 2017-09-19 DIAGNOSIS — Z79899 Other long term (current) drug therapy: Secondary | ICD-10-CM | POA: Diagnosis not present

## 2017-09-19 DIAGNOSIS — Z951 Presence of aortocoronary bypass graft: Secondary | ICD-10-CM | POA: Diagnosis not present

## 2017-09-19 DIAGNOSIS — Z7982 Long term (current) use of aspirin: Secondary | ICD-10-CM | POA: Diagnosis not present

## 2017-09-19 DIAGNOSIS — I10 Essential (primary) hypertension: Secondary | ICD-10-CM | POA: Diagnosis not present

## 2017-09-19 DIAGNOSIS — I2510 Atherosclerotic heart disease of native coronary artery without angina pectoris: Secondary | ICD-10-CM | POA: Diagnosis not present

## 2017-09-19 DIAGNOSIS — Z7984 Long term (current) use of oral hypoglycemic drugs: Secondary | ICD-10-CM | POA: Diagnosis not present

## 2017-09-19 DIAGNOSIS — E039 Hypothyroidism, unspecified: Secondary | ICD-10-CM | POA: Diagnosis not present

## 2017-09-19 DIAGNOSIS — Z79890 Hormone replacement therapy (postmenopausal): Secondary | ICD-10-CM | POA: Diagnosis not present

## 2017-09-22 ENCOUNTER — Encounter (HOSPITAL_COMMUNITY)
Admission: RE | Admit: 2017-09-22 | Discharge: 2017-09-22 | Disposition: A | Discharge status: Home / Self Care | Payer: Medicare HMO | Source: Ambulatory Visit | Attending: Interventional Cardiology | Admitting: Interventional Cardiology

## 2017-09-22 DIAGNOSIS — I2510 Atherosclerotic heart disease of native coronary artery without angina pectoris: Secondary | ICD-10-CM | POA: Diagnosis not present

## 2017-09-22 DIAGNOSIS — Z79899 Other long term (current) drug therapy: Secondary | ICD-10-CM | POA: Diagnosis not present

## 2017-09-22 DIAGNOSIS — Z7982 Long term (current) use of aspirin: Secondary | ICD-10-CM | POA: Diagnosis not present

## 2017-09-22 DIAGNOSIS — Z7984 Long term (current) use of oral hypoglycemic drugs: Secondary | ICD-10-CM | POA: Diagnosis not present

## 2017-09-22 DIAGNOSIS — Z79890 Hormone replacement therapy (postmenopausal): Secondary | ICD-10-CM | POA: Diagnosis not present

## 2017-09-22 DIAGNOSIS — Z951 Presence of aortocoronary bypass graft: Secondary | ICD-10-CM | POA: Diagnosis not present

## 2017-09-22 DIAGNOSIS — I10 Essential (primary) hypertension: Secondary | ICD-10-CM | POA: Diagnosis not present

## 2017-09-22 DIAGNOSIS — E039 Hypothyroidism, unspecified: Secondary | ICD-10-CM | POA: Diagnosis not present

## 2017-09-22 DIAGNOSIS — D696 Thrombocytopenia, unspecified: Secondary | ICD-10-CM | POA: Diagnosis not present

## 2017-09-24 ENCOUNTER — Encounter (HOSPITAL_COMMUNITY)
Admission: RE | Admit: 2017-09-24 | Discharge: 2017-09-24 | Disposition: A | Discharge status: Home / Self Care | Payer: Medicare HMO | Source: Ambulatory Visit | Attending: Interventional Cardiology | Admitting: Interventional Cardiology

## 2017-09-24 DIAGNOSIS — I2510 Atherosclerotic heart disease of native coronary artery without angina pectoris: Secondary | ICD-10-CM | POA: Diagnosis not present

## 2017-09-24 DIAGNOSIS — Z7982 Long term (current) use of aspirin: Secondary | ICD-10-CM | POA: Diagnosis not present

## 2017-09-24 DIAGNOSIS — Z79890 Hormone replacement therapy (postmenopausal): Secondary | ICD-10-CM | POA: Diagnosis not present

## 2017-09-24 DIAGNOSIS — Z7984 Long term (current) use of oral hypoglycemic drugs: Secondary | ICD-10-CM | POA: Diagnosis not present

## 2017-09-24 DIAGNOSIS — E039 Hypothyroidism, unspecified: Secondary | ICD-10-CM | POA: Diagnosis not present

## 2017-09-24 DIAGNOSIS — Z951 Presence of aortocoronary bypass graft: Secondary | ICD-10-CM

## 2017-09-24 DIAGNOSIS — D696 Thrombocytopenia, unspecified: Secondary | ICD-10-CM | POA: Diagnosis not present

## 2017-09-24 DIAGNOSIS — Z79899 Other long term (current) drug therapy: Secondary | ICD-10-CM | POA: Diagnosis not present

## 2017-09-24 DIAGNOSIS — I10 Essential (primary) hypertension: Secondary | ICD-10-CM | POA: Diagnosis not present

## 2017-09-26 ENCOUNTER — Encounter (HOSPITAL_COMMUNITY)
Admission: RE | Admit: 2017-09-26 | Discharge: 2017-09-26 | Disposition: A | Discharge status: Home / Self Care | Payer: Medicare HMO | Source: Ambulatory Visit | Attending: Interventional Cardiology | Admitting: Interventional Cardiology

## 2017-09-26 DIAGNOSIS — Z951 Presence of aortocoronary bypass graft: Secondary | ICD-10-CM

## 2017-09-26 DIAGNOSIS — D696 Thrombocytopenia, unspecified: Secondary | ICD-10-CM | POA: Diagnosis not present

## 2017-09-26 DIAGNOSIS — I2510 Atherosclerotic heart disease of native coronary artery without angina pectoris: Secondary | ICD-10-CM | POA: Diagnosis not present

## 2017-09-26 DIAGNOSIS — I10 Essential (primary) hypertension: Secondary | ICD-10-CM | POA: Diagnosis not present

## 2017-09-26 DIAGNOSIS — Z79890 Hormone replacement therapy (postmenopausal): Secondary | ICD-10-CM | POA: Diagnosis not present

## 2017-09-26 DIAGNOSIS — Z7984 Long term (current) use of oral hypoglycemic drugs: Secondary | ICD-10-CM | POA: Diagnosis not present

## 2017-09-26 DIAGNOSIS — E039 Hypothyroidism, unspecified: Secondary | ICD-10-CM | POA: Diagnosis not present

## 2017-09-26 DIAGNOSIS — Z79899 Other long term (current) drug therapy: Secondary | ICD-10-CM | POA: Diagnosis not present

## 2017-09-26 DIAGNOSIS — Z7982 Long term (current) use of aspirin: Secondary | ICD-10-CM | POA: Diagnosis not present

## 2017-09-29 ENCOUNTER — Encounter (HOSPITAL_COMMUNITY)
Admission: RE | Admit: 2017-09-29 | Discharge: 2017-09-29 | Disposition: A | Discharge status: Home / Self Care | Payer: Medicare HMO | Source: Ambulatory Visit | Attending: Interventional Cardiology | Admitting: Interventional Cardiology

## 2017-09-29 DIAGNOSIS — I10 Essential (primary) hypertension: Secondary | ICD-10-CM | POA: Diagnosis not present

## 2017-09-29 DIAGNOSIS — Z951 Presence of aortocoronary bypass graft: Secondary | ICD-10-CM | POA: Diagnosis not present

## 2017-09-29 DIAGNOSIS — Z79890 Hormone replacement therapy (postmenopausal): Secondary | ICD-10-CM | POA: Diagnosis not present

## 2017-09-29 DIAGNOSIS — Z7982 Long term (current) use of aspirin: Secondary | ICD-10-CM | POA: Diagnosis not present

## 2017-09-29 DIAGNOSIS — E039 Hypothyroidism, unspecified: Secondary | ICD-10-CM | POA: Diagnosis not present

## 2017-09-29 DIAGNOSIS — D696 Thrombocytopenia, unspecified: Secondary | ICD-10-CM | POA: Diagnosis not present

## 2017-09-29 DIAGNOSIS — Z7984 Long term (current) use of oral hypoglycemic drugs: Secondary | ICD-10-CM | POA: Diagnosis not present

## 2017-09-29 DIAGNOSIS — I2510 Atherosclerotic heart disease of native coronary artery without angina pectoris: Secondary | ICD-10-CM | POA: Diagnosis not present

## 2017-09-29 DIAGNOSIS — Z79899 Other long term (current) drug therapy: Secondary | ICD-10-CM | POA: Diagnosis not present

## 2017-10-01 ENCOUNTER — Encounter (HOSPITAL_COMMUNITY)
Admission: RE | Admit: 2017-10-01 | Discharge: 2017-10-01 | Disposition: A | Discharge status: Home / Self Care | Payer: Medicare HMO | Source: Ambulatory Visit | Attending: Interventional Cardiology | Admitting: Interventional Cardiology

## 2017-10-01 DIAGNOSIS — Z7984 Long term (current) use of oral hypoglycemic drugs: Secondary | ICD-10-CM | POA: Diagnosis not present

## 2017-10-01 DIAGNOSIS — I10 Essential (primary) hypertension: Secondary | ICD-10-CM | POA: Diagnosis not present

## 2017-10-01 DIAGNOSIS — Z79899 Other long term (current) drug therapy: Secondary | ICD-10-CM | POA: Diagnosis not present

## 2017-10-01 DIAGNOSIS — Z7982 Long term (current) use of aspirin: Secondary | ICD-10-CM | POA: Diagnosis not present

## 2017-10-01 DIAGNOSIS — Z79890 Hormone replacement therapy (postmenopausal): Secondary | ICD-10-CM | POA: Diagnosis not present

## 2017-10-01 DIAGNOSIS — D696 Thrombocytopenia, unspecified: Secondary | ICD-10-CM | POA: Diagnosis not present

## 2017-10-01 DIAGNOSIS — E039 Hypothyroidism, unspecified: Secondary | ICD-10-CM | POA: Diagnosis not present

## 2017-10-01 DIAGNOSIS — Z951 Presence of aortocoronary bypass graft: Secondary | ICD-10-CM

## 2017-10-01 DIAGNOSIS — I2510 Atherosclerotic heart disease of native coronary artery without angina pectoris: Secondary | ICD-10-CM | POA: Diagnosis not present

## 2017-10-03 ENCOUNTER — Encounter (HOSPITAL_COMMUNITY)
Admission: RE | Admit: 2017-10-03 | Discharge: 2017-10-03 | Disposition: A | Discharge status: Home / Self Care | Payer: Medicare HMO | Source: Ambulatory Visit | Attending: Interventional Cardiology | Admitting: Interventional Cardiology

## 2017-10-03 DIAGNOSIS — Z7982 Long term (current) use of aspirin: Secondary | ICD-10-CM | POA: Diagnosis not present

## 2017-10-03 DIAGNOSIS — Z79890 Hormone replacement therapy (postmenopausal): Secondary | ICD-10-CM | POA: Diagnosis not present

## 2017-10-03 DIAGNOSIS — I2510 Atherosclerotic heart disease of native coronary artery without angina pectoris: Secondary | ICD-10-CM | POA: Diagnosis not present

## 2017-10-03 DIAGNOSIS — Z951 Presence of aortocoronary bypass graft: Secondary | ICD-10-CM

## 2017-10-03 DIAGNOSIS — I10 Essential (primary) hypertension: Secondary | ICD-10-CM | POA: Diagnosis not present

## 2017-10-03 DIAGNOSIS — Z7984 Long term (current) use of oral hypoglycemic drugs: Secondary | ICD-10-CM | POA: Diagnosis not present

## 2017-10-03 DIAGNOSIS — Z79899 Other long term (current) drug therapy: Secondary | ICD-10-CM | POA: Diagnosis not present

## 2017-10-03 DIAGNOSIS — E039 Hypothyroidism, unspecified: Secondary | ICD-10-CM | POA: Diagnosis not present

## 2017-10-03 DIAGNOSIS — D696 Thrombocytopenia, unspecified: Secondary | ICD-10-CM | POA: Diagnosis not present

## 2017-10-06 ENCOUNTER — Encounter (HOSPITAL_COMMUNITY)
Admission: RE | Admit: 2017-10-06 | Discharge: 2017-10-06 | Disposition: A | Discharge status: Home / Self Care | Payer: Medicare HMO | Source: Ambulatory Visit | Attending: Interventional Cardiology | Admitting: Interventional Cardiology

## 2017-10-06 DIAGNOSIS — Z7984 Long term (current) use of oral hypoglycemic drugs: Secondary | ICD-10-CM | POA: Diagnosis not present

## 2017-10-06 DIAGNOSIS — I10 Essential (primary) hypertension: Secondary | ICD-10-CM | POA: Diagnosis not present

## 2017-10-06 DIAGNOSIS — Z79890 Hormone replacement therapy (postmenopausal): Secondary | ICD-10-CM | POA: Diagnosis not present

## 2017-10-06 DIAGNOSIS — Z79899 Other long term (current) drug therapy: Secondary | ICD-10-CM | POA: Diagnosis not present

## 2017-10-06 DIAGNOSIS — Z951 Presence of aortocoronary bypass graft: Secondary | ICD-10-CM

## 2017-10-06 DIAGNOSIS — E039 Hypothyroidism, unspecified: Secondary | ICD-10-CM | POA: Diagnosis not present

## 2017-10-06 DIAGNOSIS — I2510 Atherosclerotic heart disease of native coronary artery without angina pectoris: Secondary | ICD-10-CM | POA: Diagnosis not present

## 2017-10-06 DIAGNOSIS — Z7982 Long term (current) use of aspirin: Secondary | ICD-10-CM | POA: Diagnosis not present

## 2017-10-06 DIAGNOSIS — D696 Thrombocytopenia, unspecified: Secondary | ICD-10-CM | POA: Diagnosis not present

## 2017-10-08 ENCOUNTER — Encounter (HOSPITAL_COMMUNITY)
Admission: RE | Admit: 2017-10-08 | Discharge: 2017-10-08 | Disposition: A | Discharge status: Home / Self Care | Payer: Medicare HMO | Source: Ambulatory Visit | Attending: Interventional Cardiology | Admitting: Interventional Cardiology

## 2017-10-08 DIAGNOSIS — Z7984 Long term (current) use of oral hypoglycemic drugs: Secondary | ICD-10-CM | POA: Diagnosis not present

## 2017-10-08 DIAGNOSIS — I2510 Atherosclerotic heart disease of native coronary artery without angina pectoris: Secondary | ICD-10-CM | POA: Diagnosis not present

## 2017-10-08 DIAGNOSIS — Z951 Presence of aortocoronary bypass graft: Secondary | ICD-10-CM | POA: Diagnosis not present

## 2017-10-08 DIAGNOSIS — D696 Thrombocytopenia, unspecified: Secondary | ICD-10-CM | POA: Diagnosis not present

## 2017-10-08 DIAGNOSIS — Z79890 Hormone replacement therapy (postmenopausal): Secondary | ICD-10-CM | POA: Diagnosis not present

## 2017-10-08 DIAGNOSIS — E039 Hypothyroidism, unspecified: Secondary | ICD-10-CM | POA: Diagnosis not present

## 2017-10-08 DIAGNOSIS — Z7982 Long term (current) use of aspirin: Secondary | ICD-10-CM | POA: Diagnosis not present

## 2017-10-08 DIAGNOSIS — I10 Essential (primary) hypertension: Secondary | ICD-10-CM | POA: Diagnosis not present

## 2017-10-08 DIAGNOSIS — Z79899 Other long term (current) drug therapy: Secondary | ICD-10-CM | POA: Diagnosis not present

## 2017-10-10 ENCOUNTER — Encounter (HOSPITAL_COMMUNITY)
Admission: RE | Admit: 2017-10-10 | Discharge: 2017-10-10 | Disposition: A | Discharge status: Home / Self Care | Payer: Medicare HMO | Source: Ambulatory Visit | Attending: Interventional Cardiology | Admitting: Interventional Cardiology

## 2017-10-10 DIAGNOSIS — Z79890 Hormone replacement therapy (postmenopausal): Secondary | ICD-10-CM | POA: Diagnosis not present

## 2017-10-10 DIAGNOSIS — Z7982 Long term (current) use of aspirin: Secondary | ICD-10-CM | POA: Diagnosis not present

## 2017-10-10 DIAGNOSIS — Z951 Presence of aortocoronary bypass graft: Secondary | ICD-10-CM | POA: Diagnosis not present

## 2017-10-10 DIAGNOSIS — I2510 Atherosclerotic heart disease of native coronary artery without angina pectoris: Secondary | ICD-10-CM | POA: Diagnosis not present

## 2017-10-10 DIAGNOSIS — Z7984 Long term (current) use of oral hypoglycemic drugs: Secondary | ICD-10-CM | POA: Diagnosis not present

## 2017-10-10 DIAGNOSIS — I10 Essential (primary) hypertension: Secondary | ICD-10-CM | POA: Diagnosis not present

## 2017-10-10 DIAGNOSIS — D696 Thrombocytopenia, unspecified: Secondary | ICD-10-CM | POA: Diagnosis not present

## 2017-10-10 DIAGNOSIS — E039 Hypothyroidism, unspecified: Secondary | ICD-10-CM | POA: Diagnosis not present

## 2017-10-10 DIAGNOSIS — Z79899 Other long term (current) drug therapy: Secondary | ICD-10-CM | POA: Diagnosis not present

## 2017-10-15 ENCOUNTER — Encounter (HOSPITAL_COMMUNITY)
Admission: RE | Admit: 2017-10-15 | Discharge: 2017-10-15 | Disposition: A | Discharge status: Home / Self Care | Payer: Medicare HMO | Source: Ambulatory Visit | Attending: Interventional Cardiology | Admitting: Interventional Cardiology

## 2017-10-15 DIAGNOSIS — Z79890 Hormone replacement therapy (postmenopausal): Secondary | ICD-10-CM | POA: Insufficient documentation

## 2017-10-15 DIAGNOSIS — E039 Hypothyroidism, unspecified: Secondary | ICD-10-CM | POA: Diagnosis not present

## 2017-10-15 DIAGNOSIS — Z7984 Long term (current) use of oral hypoglycemic drugs: Secondary | ICD-10-CM | POA: Insufficient documentation

## 2017-10-15 DIAGNOSIS — Z7982 Long term (current) use of aspirin: Secondary | ICD-10-CM | POA: Diagnosis not present

## 2017-10-15 DIAGNOSIS — Z79899 Other long term (current) drug therapy: Secondary | ICD-10-CM | POA: Diagnosis not present

## 2017-10-15 DIAGNOSIS — D696 Thrombocytopenia, unspecified: Secondary | ICD-10-CM | POA: Insufficient documentation

## 2017-10-15 DIAGNOSIS — I2510 Atherosclerotic heart disease of native coronary artery without angina pectoris: Secondary | ICD-10-CM | POA: Insufficient documentation

## 2017-10-15 DIAGNOSIS — Z951 Presence of aortocoronary bypass graft: Secondary | ICD-10-CM | POA: Diagnosis not present

## 2017-10-15 DIAGNOSIS — I10 Essential (primary) hypertension: Secondary | ICD-10-CM | POA: Insufficient documentation

## 2017-10-16 NOTE — Progress Notes (Signed)
Cardiac Individual Treatment Plan  Patient Details  Name: Wayne Mitchell MRN: [id number] Date of Birth: 17-Jan-1936 Referring Provider:   Flowsheet Row CARDIAC REHAB PHASE II ORIENTATION from 08/12/2017 in [place]  Referring Provider  Daneen Schick MD      Initial Encounter Date:  [place] from 08/12/2017 in [place]  Date  08/12/17      Visit Diagnosis: 06/02/17 S/P CABG x 3  Patient's Home Medications on Admission:  Current Outpatient Medications:  .  acetaminophen (TYLENOL) 325 MG tablet, Take 2 tablets (650 mg total) by mouth every 6 (six) hours as needed for mild pain., Disp: , Rfl:  .  aspirin EC 81 MG tablet, Take 81 mg by mouth See admin instructions. Twice monthly... Pt takes 1 pill on the 1st and 1 pill on the 15th, Disp: , Rfl:  .  atorvastatin (LIPITOR) 20 MG tablet, Take 20 mg by mouth daily., Disp: , Rfl:  .  carvedilol (COREG) 3.125 MG tablet, Take 1 tablet (3.125 mg total) by mouth daily., Disp: 90 tablet, Rfl: 3 .  levothyroxine (SYNTHROID, LEVOTHROID) 112 MCG tablet, Take 112 mcg by mouth daily before breakfast., Disp: , Rfl:   Past Medical History: Past Medical History:  Diagnosis Date  . Carotid artery disease ([place])   . Colon polyp    small  . Coronary artery disease   . Coronary artery disease involving native coronary artery of native heart with unstable angina pectoris ([place]) 05/30/2017   3V CABG 06/02/17  . HTN (hypertension)   . Hypothyroidism   . Syncope   . Syncope   . Thrombocytopenia ([place])     Tobacco Use: Social History   Tobacco Use  Smoking Status Never Smoker  Smokeless Tobacco Never Used    Labs: Recent Review Flowsheet Data    Labs for ITP Cardiac and Pulmonary Rehab Latest Ref Rng & Units 06/04/2017 06/05/2017 06/05/2017 06/05/2017 06/06/2017   Cholestrol 0 - 200 mg/dL - - - - -   LDLCALC 0 - 99 mg/dL - - - - -   HDL >40 mg/dL - -  - - -   Trlycerides <150 mg/dL - - - - -   Hemoglobin A1c 4.8 - 5.6 % - - - - -   PHART 7.350 - 7.450 - - 7.436 - -   PCO2ART 32.0 - 48.0 mmHg - - 34.8 - -   HCO3 20.0 - 28.0 mmol/L - - 23.5 - -   TCO2 22 - 32 mmol/L - - 25 26 -   ACIDBASEDEF 0.0 - 2.0 mmol/L - - 1.0 - -   O2SAT % 46.1 39.8 97.0 - 54.8      Capillary Blood Glucose: Lab Results  Component Value Date   GLUCAP 125 (H) 06/08/2017   GLUCAP 118 (H) 06/08/2017   GLUCAP 106 (H) 06/07/2017   GLUCAP 126 (H) 06/07/2017   GLUCAP 113 (H) 06/07/2017     Exercise Target Goals: Exercise Program Goal: Individual exercise prescription set using results from initial 6 min walk test and THRR while considering  patient's activity barriers and safety.   Exercise Prescription Goal: Initial exercise prescription builds to 30-45 minutes a day of aerobic activity, 2-3 days per week.  Home exercise guidelines will be given to patient during program as part of exercise prescription that the participant will acknowledge.  Activity Barriers & Risk Stratification: Activity Barriers & Cardiac Risk Stratification - 08/12/17  1055    Activity Barriers & Cardiac Risk Stratification          Activity Barriers  Deconditioning;Muscular Weakness    Cardiac Risk Stratification  High           6 Minute Walk: 6 Minute Walk    6 Minute Walk    Row Name 08/12/17 1054   Phase  Initial   Distance  1456 feet   Walk Time  6 minutes   # of Rest Breaks  0   MPH  2.76   METS  3.09   RPE  10   VO2 Peak  10.83   Symptoms  No   Resting HR  99 bpm   Resting BP  112/62   Resting Oxygen Saturation   98 %   Exercise Oxygen Saturation  during 6 min walk  98 %   Max Ex. HR  115 bpm   Max Ex. BP  134/78   2 Minute Post BP  130/70          Oxygen Initial Assessment:   Oxygen Re-Evaluation:   Oxygen Discharge (Final Oxygen Re-Evaluation):   Initial Exercise Prescription: Initial Exercise Prescription - 08/12/17 1000    Date of Initial  Exercise RX and Referring Provider          Date  08/12/17    Referring Provider  Daneen Schick MD    Expected Discharge Date  11/12/17        Bike          Level  0.5    Minutes  10    METs  2.4        NuStep          Level  2    SPM  70    Minutes  10    METs  2        Track          Laps  10    Minutes  10    METs  2.74        Prescription Details          Frequency (times per week)  3    Duration  Progress to 30 minutes of continuous aerobic without signs/symptoms of physical distress        Intensity          THRR 40-80% of Max Heartrate  56-113    Ratings of Perceived Exertion  11-13    Perceived Dyspnea  0-4        Progression          Progression  Continue to progress workloads to maintain intensity without signs/symptoms of physical distress.        Resistance Training          Training Prescription  Yes    Weight  2lbs    Reps  10-15           Perform Capillary Blood Glucose checks as needed.  Exercise Prescription Changes: Exercise Prescription Changes    Response to Exercise    Row Name 08/20/17 1504 08/25/17 1612 09/01/17 1600 09/12/17 1200 09/24/17 1100   Blood Pressure (Admit)  144/78  110/58  no documentation  140/68  122/70   Blood Pressure (Exercise)  142/62  138/60  no documentation  164/84  160/80   Blood Pressure (Exit)  124/80  106/62  no documentation  124/70  118/80   Heart Rate (Admit)  95 bpm  89 bpm  no  documentation  90 bpm  80 bpm   Heart Rate (Exercise)  111 bpm  114 bpm  no documentation  111 bpm  116 bpm   Heart Rate (Exit)  77 bpm  88 bpm  no documentation  87 bpm  80 bpm   Rating of Perceived Exertion (Exercise)  13  13  no documentation  14  11   Symptoms  none  none  no documentation  none  none   Comments  pt oriented to exercise equipment  no documentation  no documentation  no documentation  no documentation   Duration  Continue with 30 min of aerobic exercise without signs/symptoms of physical distress.   Continue with 30 min of aerobic exercise without signs/symptoms of physical distress.  no documentation  Continue with 30 min of aerobic exercise without signs/symptoms of physical distress.  Continue with 30 min of aerobic exercise without signs/symptoms of physical distress.   Intensity  THRR unchanged  THRR unchanged  no documentation  THRR unchanged  THRR unchanged       Progression    Row Name 08/20/17 1504 08/25/17 1612 09/01/17 1600 09/12/17 1200 09/24/17 1100   Progression  Continue to progress workloads to maintain intensity without signs/symptoms of physical distress.  Continue to progress workloads to maintain intensity without signs/symptoms of physical distress.  no documentation  Continue to progress workloads to maintain intensity without signs/symptoms of physical distress.  Continue to progress workloads to maintain intensity without signs/symptoms of physical distress.   Average METs  2.5  2.6  no documentation  3.3  3.3       Resistance Training    Row Name 08/20/17 1504 08/25/17 1612 09/01/17 1600 09/12/17 1200 09/24/17 1100   Training Prescription  No relaxation day  Yes  no documentation  Yes  No relaxation day   Weight  no documentation  3lbs  no documentation  4lbs  no documentation   Reps  no documentation  10-15  no documentation  10-15  no documentation   Time  10 Minutes  10 Minutes  no documentation  10 Minutes  [place] Name 08/20/17 1504 08/25/17 1612 09/01/17 1600 09/12/17 1200 09/24/17 1100   Level  0.5  0.5  no documentation  0.7  0.7   Minutes  10  10  no documentation  10  10   METs  2.4  2.4  no documentation  2.95  2.99       NuStep    Row Name 08/20/17 1504 08/25/17 1612 09/01/17 1600 09/12/17 1200 09/24/17 1100   Level  2  2  no documentation  4  5   SPM  70  80  no documentation  80  80   Minutes  10  10  no documentation  10  10   METs  2  2.4  no documentation  3.8  3.9       Track    Row Name 08/20/17 1504 08/25/17 1612  09/01/17 1600 09/12/17 1200 09/24/17 1100   Laps  12  10  no documentation  13  12   Minutes  10  10  no documentation  10  10   METs  3.09  2.74  no documentation  3.26  3.09       Home Exercise Plan    Row Name 08/20/17 1504 08/25/17 1612 09/01/17 1600 09/12/17 1200 09/24/17 1100   Plans to continue  exercise at  no documentation  no documentation  no documentation  Home (comment)  Home (comment)   Frequency  no documentation  no documentation  no documentation  Add 2 additional days to program exercise sessions.  Add 2 additional days to program exercise sessions.   Initial Home Exercises Provided  no documentation  no documentation  no documentation  09/03/17  09/03/17       Response to Exercise    Row Name 10/10/17 1440   Blood Pressure (Admit)  122/72   Blood Pressure (Exercise)  150/80   Blood Pressure (Exit)  118/60   Heart Rate (Admit)  81 bpm   Heart Rate (Exercise)  123 bpm   Heart Rate (Exit)  81 bpm   Rating of Perceived Exertion (Exercise)  11   Perceived Dyspnea (Exercise)  0   Symptoms  None    Duration  Continue with 30 min of aerobic exercise without signs/symptoms of physical distress.   Intensity  THRR unchanged       Progression    Row Name 10/10/17 1440   Progression  Continue to progress workloads to maintain intensity without signs/symptoms of physical distress.   Average METs  3.81       Resistance Training    Row Name 10/10/17 1440   Training Prescription  Yes relaxation day   Weight  4lbs   Reps  10-15   Time  10 Minutes       [place] Name 10/10/17 1440   Level  0.7   Minutes  10   METs  3       NuStep    Row Name 10/10/17 1440   Level  5   SPM  85   Minutes  10   METs  4.3       Track    Row Name 10/10/17 1440   Laps  13   Minutes  10   METs  3.3       Home Exercise Plan    [place] Name 10/10/17 1440   Plans to continue exercise at  Home (comment) Walking   Frequency  Add 2 additional days to program exercise sessions.    Initial Home Exercises Provided  09/03/17          Exercise Comments: Exercise Comments    Row Name 08/21/17 1449 09/12/17 1212 10/06/17 1727   Exercise Comments  Pt completed first session of cardiac rehab. Pt was able to exercise 30 minutes without difficulty. Rehab staff will monitor pt's activity levels and progress activities as tolerated.   Reviewed METs and goals. Pt is tolerating exercise program very well. Rehab staff will continue to monitor pt's activity levels   Reviewed METs and goals. Pt is tolerating exercise program very well. Rehab staff will continue to monitor pt's activity levels       Exercise Goals and Review: Exercise Goals    Exercise Goals    Row Name 08/12/17 0935   Increase Physical Activity  Yes   Intervention  Provide advice, education, support and counseling about physical activity/exercise needs.;Develop an individualized exercise prescription for aerobic and resistive training based on initial evaluation findings, risk stratification, comorbidities and participant's personal goals.   Expected Outcomes  Short Term: Attend rehab on a regular basis to increase amount of physical activity.;Long Term: Add in home exercise to make exercise part of routine and to increase amount of physical activity.;Long Term: Exercising regularly at least 3-5 days a week.  Increase Strength and Stamina  Yes   Intervention  Provide advice, education, support and counseling about physical activity/exercise needs.;Develop an individualized exercise prescription for aerobic and resistive training based on initial evaluation findings, risk stratification, comorbidities and participant's personal goals.   Expected Outcomes  Short Term: Increase workloads from initial exercise prescription for resistance, speed, and METs.;Short Term: Perform resistance training exercises routinely during rehab and add in resistance training at home;Long Term: Improve cardiorespiratory fitness, muscular  endurance and strength as measured by increased METs and functional capacity (6MWT)   Able to understand and use rate of perceived exertion (RPE) scale  Yes   Intervention  Provide education and explanation on how to use RPE scale   Expected Outcomes  Long Term:  Able to use RPE to guide intensity level when exercising independently;Short Term: Able to use RPE daily in rehab to express subjective intensity level   Able to understand and use Dyspnea scale  Yes   Intervention  Provide education and explanation on how to use Dyspnea scale   Expected Outcomes  Short Term: Able to use Dyspnea scale daily in rehab to express subjective sense of shortness of breath during exertion;Long Term: Able to use Dyspnea scale to guide intensity level when exercising independently   Knowledge and understanding of Target Heart Rate Range (THRR)  Yes   Intervention  Provide education and explanation of THRR including how the numbers were predicted and where they are located for reference   Expected Outcomes  Short Term: Able to state/look up THRR;Long Term: Able to use THRR to govern intensity when exercising independently;Short Term: Able to use daily as guideline for intensity in rehab   Able to check pulse independently  Yes   Intervention  Provide education and demonstration on how to check pulse in carotid and radial arteries.;Review the importance of being able to check your own pulse for safety during independent exercise   Expected Outcomes  Short Term: Able to explain why pulse checking is important during independent exercise;Long Term: Able to check pulse independently and accurately   Understanding of Exercise Prescription  Yes   Intervention  Provide education, explanation, and written materials on patient's individual exercise prescription   Expected Outcomes  Short Term: Able to explain program exercise prescription;Long Term: Able to explain home exercise prescription to exercise independently           Exercise Goals Re-Evaluation : Exercise Goals Re-Evaluation    Exercise Goal Re-Evaluation    Row Name 08/20/17 1447 09/03/17 1141 10/06/17 1725   Exercise Goals Review  Increase Physical Activity;Increase Strength and Stamina  Increase Physical Activity;Able to understand and use rate of perceived exertion (RPE) scale;Knowledge and understanding of Target Heart Rate Range (THRR);Understanding of Exercise Prescription;Increase Strength and Stamina;Able to check pulse independently;Improve claudication pain tolerance and improve walking ability  Increase Physical Activity;Able to understand and use rate of perceived exertion (RPE) scale;Knowledge and understanding of Target Heart Rate Range (THRR);Understanding of Exercise Prescription;Increase Strength and Stamina;Able to check pulse independently;Improve claudication pain tolerance and improve walking ability   Comments  Pt was oriented to exercise equipment today. Pt was able to complete exercise session without sings/symptoms of physical distress.   Reviewed HEP in which pt plans to walk at home for 15-20 minutes and slowly progress to 30 minutes. Pt will walk 2x/week in addiiton to coming to cardiac rehab. Pt is also understanding of temperature/emergency precautions.  Pt has returned to dancing. Pt feels great and ready to mow the  lawn. Discussed increasing MET level to a 5 before pushing a self-propel mower. Pt is currently averaging 3.5-4.0 METs. Will f/u   Expected Outcomes  Pt will be able to exercise 30 minutes without difficulty and improve in cardiorespiratory fitness  Pt will be able to exercise 30 minutes without difficulty and improve in cardiorespiratory fitness  Pt will continue to improve in MET levels and CR fitness to be able to mow the lawn with a self propel mower.           Discharge Exercise Prescription (Final Exercise Prescription Changes): Exercise Prescription Changes - 10/10/17 1440    Response to Exercise           Blood Pressure (Admit)  122/72    Blood Pressure (Exercise)  150/80    Blood Pressure (Exit)  118/60    Heart Rate (Admit)  81 bpm    Heart Rate (Exercise)  123 bpm    Heart Rate (Exit)  81 bpm    Rating of Perceived Exertion (Exercise)  11    Perceived Dyspnea (Exercise)  0    Symptoms  None     Duration  Continue with 30 min of aerobic exercise without signs/symptoms of physical distress.    Intensity  THRR unchanged        Progression          Progression  Continue to progress workloads to maintain intensity without signs/symptoms of physical distress.    Average METs  3.81        Resistance Training          Training Prescription  Yes   relaxation day   Weight  4lbs    Reps  10-15    Time  10 Minutes        Bike          Level  0.7    Minutes  10    METs  3        NuStep          Level  5    SPM  85    Minutes  10    METs  4.3        Track          Laps  13    Minutes  10    METs  3.3        Home Exercise Plan          Plans to continue exercise at  Home (comment)   Walking   Frequency  Add 2 additional days to program exercise sessions.    Initial Home Exercises Provided  09/03/17           Nutrition:  Target Goals: Understanding of nutrition guidelines, daily intake of sodium '1500mg'$ , cholesterol '200mg'$ , calories 30% from fat and 7% or less from saturated fats, daily to have 5 or more servings of fruits and vegetables.  Biometrics: Pre Biometrics - 08/12/17 1058    Pre Biometrics          Height  '5\' 7"'$  (1.702 m)    Weight  67.6 kg    Waist Circumference  33 inches    Hip Circumference  38.25 inches    Waist to Hip Ratio  0.86 %    BMI (Calculated)  23.34    Triceps Skinfold  23 mm    % Body Fat  24.9 %    Grip Strength  29 kg    Flexibility  0 in  Single Leg Stand  6.35 seconds            Nutrition Therapy Plan and Nutrition Goals: Nutrition Therapy & Goals - 08/12/17 1327    Nutrition Therapy          Diet  consistent  carbohydrate heart healthy        Personal Nutrition Goals          Nutrition Goal  identify and limit food sources of saturated fat, trans fat, and sodium    Personal Goal #2  pt able to name foods that affect blood glucose        Intervention Plan          Intervention  Prescribe, educate and counsel regarding individualized specific dietary modifications aiming towards targeted core components such as weight, hypertension, lipid management, diabetes, heart failure and other comorbidities.    Expected Outcomes  Short Term Goal: Understand basic principles of dietary content, such as calories, fat, sodium, cholesterol and nutrients.           Nutrition Assessments: Nutrition Assessments - 08/12/17 1328    MEDFICTS Scores          Pre Score  25           Nutrition Goals Re-Evaluation:   Nutrition Goals Re-Evaluation:   Nutrition Goals Discharge (Final Nutrition Goals Re-Evaluation):   Psychosocial: Target Goals: Acknowledge presence or absence of significant depression and/or stress, maximize coping skills, provide positive support system. Participant is able to verbalize types and ability to use techniques and skills needed for reducing stress and depression.  Initial Review & Psychosocial Screening: Initial Psych Review & Screening - 08/12/17 1006    Initial Review          Current issues with  None Identified        Family Dynamics          Good Support System?  Yes   spouse       Barriers          Psychosocial barriers to participate in program  There are no identifiable barriers or psychosocial needs.        Screening Interventions          Interventions  Encouraged to exercise           Quality of Life Scores: Quality of Life - 08/20/17 1227    Quality of Life          Select  Quality of Life        Quality of Life Scores          Health/Function Pre  26.2 %    Socioeconomic Pre  30 %    Psych/Spiritual Pre  29.14 %    GLOBAL Pre   28.21 %   scores reviewed with pt. no concerns identified. pt encouraged to participate in CR exercise for overall well being.          Scores of 19 and below usually indicate a poorer quality of life in these areas.  A difference of  2-3 points is a clinically meaningful difference.  A difference of 2-3 points in the total score of the Quality of Life Index has been associated with significant improvement in overall quality of life, self-image, physical symptoms, and general health in studies assessing change in quality of life.  PHQ-9: Recent Review Flowsheet Data    Depression screen [facility] 2/9 08/20/2017   Decreased Interest 0   Down, Depressed, Hopeless 0  PHQ - 2 Score 0     Interpretation of Total Score  Total Score Depression Severity:  1-4 = Minimal depression, 5-9 = Mild depression, 10-14 = Moderate depression, 15-19 = Moderately severe depression, 20-27 = Severe depression   Psychosocial Evaluation and Intervention: Psychosocial Evaluation - 10/15/17 1712    Psychosocial Evaluation & Interventions          Interventions  Encouraged to exercise with the program and follow exercise prescription    Comments  no psychosocial needs identified, no interventions necessary.     Expected Outcomes  pt will exhibit positive outlook with good coping skills.     Continue Psychosocial Services   No Follow up required           Psychosocial Re-Evaluation: Psychosocial Re-Evaluation    Psychosocial Re-Evaluation    Row Name 08/21/17 [phone] 09/16/17 1135 10/16/17 1627   Current issues with  None Identified  None Identified  None Identified   Comments  no psychosocial needs identified, no interventions necessary   no psychosocial needs identified, no interventions necessary   no psychosocial needs identified, no interventions necessary    Expected Outcomes  pt will exhibit positive outlook with good coping skills.   pt will exhibit positive outlook with good coping skills.   pt will  exhibit positive outlook with good coping skills.    Interventions  Encouraged to attend Cardiac Rehabilitation for the exercise  Encouraged to attend Cardiac Rehabilitation for the exercise  Encouraged to attend Cardiac Rehabilitation for the exercise   Continue Psychosocial Services   No Follow up required  No Follow up required  No Follow up required          Psychosocial Discharge (Final Psychosocial Re-Evaluation): Psychosocial Re-Evaluation - 10/16/17 1627    Psychosocial Re-Evaluation          Current issues with  None Identified    Comments  no psychosocial needs identified, no interventions necessary     Expected Outcomes  pt will exhibit positive outlook with good coping skills.     Interventions  Encouraged to attend Cardiac Rehabilitation for the exercise    Continue Psychosocial Services   No Follow up required           Vocational Rehabilitation: Provide vocational rehab assistance to qualifying candidates.   Vocational Rehab Evaluation & Intervention: Vocational Rehab - 08/12/17 1006    Initial Vocational Rehab Evaluation & Intervention          Assessment shows need for Vocational Rehabilitation  No   retired           Education: Education Goals: Education classes will be provided on a weekly basis, covering required topics. Participant will state understanding/return demonstration of topics presented.  Learning Barriers/Preferences: Learning Barriers/Preferences - 08/12/17 0935    Learning Barriers/Preferences          Learning Barriers  Sight    Learning Preferences  Written Material           Education Topics: Count Your Pulse:  -Group instruction provided by verbal instruction, demonstration, patient participation and written materials to support subject.  Instructors address importance of being able to find your pulse and how to count your pulse when at home without a heart monitor.  Patients get hands on experience counting their pulse with  staff help and individually. Flowsheet Row CARDIAC REHAB PHASE II EXERCISE from 10/08/2017 in [place]  Date  09/19/17  Educator  RN  Instruction Review  Code  2- Demonstrated Understanding      Heart Attack, Angina, and Risk Factor Modification:  -Group instruction provided by verbal instruction, video, and written materials to support subject.  Instructors address signs and symptoms of angina and heart attacks.    Also discuss risk factors for heart disease and how to make changes to improve heart health risk factors. Flowsheet Row CARDIAC REHAB PHASE II EXERCISE from 10/08/2017 in [place]  Date  09/17/17  Educator  RN  Instruction Review Code  2- Demonstrated Understanding      Functional Fitness:  -Group instruction provided by verbal instruction, demonstration, patient participation, and written materials to support subject.  Instructors address safety measures for doing things around the house.  Discuss how to get up and down off the floor, how to pick things up properly, how to safely get out of a chair without assistance, and balance training. Flowsheet Row CARDIAC REHAB PHASE II EXERCISE from 10/08/2017 in [place]  Date  09/26/17  Instruction Review Code  2- Demonstrated Understanding      Meditation and Mindfulness:  -Group instruction provided by verbal instruction, patient participation, and written materials to support subject.  Instructor addresses importance of mindfulness and meditation practice to help reduce stress and improve awareness.  Instructor also leads participants through a meditation exercise.  Flowsheet Row CARDIAC REHAB PHASE II EXERCISE from 10/08/2017 in [place]  Date  10/08/17  Educator  Jeanella Craze  Instruction Review Code  2- Demonstrated Understanding      Stretching for Flexibility and Mobility:  -Group  instruction provided by verbal instruction, patient participation, and written materials to support subject.  Instructors lead participants through series of stretches that are designed to increase flexibility thus improving mobility.  These stretches are additional exercise for major muscle groups that are typically performed during regular warm up and cool down.   Hands Only CPR:  -Group verbal, video, and participation provides a basic overview of AHA guidelines for community CPR. Role-play of emergencies allow participants the opportunity to practice calling for help and chest compression technique with discussion of AED use.   Hypertension: -Group verbal and written instruction that provides a basic overview of hypertension including the most recent diagnostic guidelines, risk factor reduction with self-care instructions and medication management. Flowsheet Row CARDIAC REHAB PHASE II EXERCISE from 10/08/2017 in [place]  Date  08/29/17  Educator  RN  Instruction Review Code  2- Demonstrated Understanding       Nutrition I class: Heart Healthy Eating:  -Group instruction provided by PowerPoint slides, verbal discussion, and written materials to support subject matter. The instructor gives an explanation and review of the Therapeutic Lifestyle Changes diet recommendations, which includes a discussion on lipid goals, dietary fat, sodium, fiber, plant stanol/sterol esters, sugar, and the components of a well-balanced, healthy diet.   Nutrition II class: Lifestyle Skills:  -Group instruction provided by PowerPoint slides, verbal discussion, and written materials to support subject matter. The instructor gives an explanation and review of label reading, grocery shopping for heart health, heart healthy recipe modifications, and ways to make healthier choices when eating out.   Diabetes Question & Answer:  -Group instruction provided by PowerPoint slides,  verbal discussion, and written materials to support subject matter. The instructor gives an explanation and review of diabetes co-morbidities, pre- and post-prandial blood glucose goals, pre-exercise blood glucose goals, signs, symptoms, and treatment  of hypoglycemia and hyperglycemia, and foot care basics.   Diabetes Blitz:  -Group instruction provided by PowerPoint slides, verbal discussion, and written materials to support subject matter. The instructor gives an explanation and review of the physiology behind type 1 and type 2 diabetes, diabetes medications and rational behind using different medications, pre- and post-prandial blood glucose recommendations and Hemoglobin A1c goals, diabetes diet, and exercise including blood glucose guidelines for exercising safely.    Portion Distortion:  -Group instruction provided by PowerPoint slides, verbal discussion, written materials, and food models to support subject matter. The instructor gives an explanation of serving size versus portion size, changes in portions sizes over the last 20 years, and what consists of a serving from each food group.   Stress Management:  -Group instruction provided by verbal instruction, video, and written materials to support subject matter.  Instructors review role of stress in heart disease and how to cope with stress positively.   Flowsheet Row CARDIAC REHAB PHASE II EXERCISE from 10/08/2017 in [place]  Date  09/03/17  Educator  RN  Instruction Review Code  2- Demonstrated Understanding      Exercising on Your Own:  -Group instruction provided by verbal instruction, power point, and written materials to support subject.  Instructors discuss benefits of exercise, components of exercise, frequency and intensity of exercise, and end points for exercise.  Also discuss use of nitroglycerin and activating EMS.  Review options of places to exercise outside of rehab.  Review guidelines  for sex with heart disease. Flowsheet Row CARDIAC REHAB PHASE II EXERCISE from 10/08/2017 in [place]  Date  10/01/17  Educator  EP  Instruction Review Code  2- Demonstrated Understanding      Cardiac Drugs I:  -Group instruction provided by verbal instruction and written materials to support subject.  Instructor reviews cardiac drug classes: antiplatelets, anticoagulants, beta blockers, and statins.  Instructor discusses reasons, side effects, and lifestyle considerations for each drug class.   Cardiac Drugs II:  -Group instruction provided by verbal instruction and written materials to support subject.  Instructor reviews cardiac drug classes: angiotensin converting enzyme inhibitors (ACE-I), angiotensin II receptor blockers (ARBs), nitrates, and calcium channel blockers.  Instructor discusses reasons, side effects, and lifestyle considerations for each drug class. Flowsheet Row CARDIAC REHAB PHASE II EXERCISE from 10/08/2017 in [place]  Date  09/24/17  Educator  pharmacist  Instruction Review Code  2- Demonstrated Understanding      Anatomy and Physiology of the Circulatory System:  Group verbal and written instruction and models provide basic cardiac anatomy and physiology, with the coronary electrical and arterial systems. Review of: AMI, Angina, Valve disease, Heart Failure, Peripheral Artery Disease, Cardiac Arrhythmia, Pacemakers, and the ICD. Flowsheet Row CARDIAC REHAB PHASE II EXERCISE from 10/08/2017 in [place]  Date  09/10/17  Educator  RN  Instruction Review Code  2- Demonstrated Understanding      Other Education:  -Group or individual verbal, written, or video instructions that support the educational goals of the cardiac rehab program.   Holiday Eating Survival Tips:  -Group instruction provided by PowerPoint slides, verbal discussion, and written materials to  support subject matter. The instructor gives patients tips, tricks, and techniques to help them not only survive but enjoy the holidays despite the onslaught of food that accompanies the holidays.   Knowledge Questionnaire Score: Knowledge Questionnaire Score - 08/12/17 1006  Knowledge Questionnaire Score          Pre Score  20/24           Core Components/Risk Factors/Patient Goals at Admission: Personal Goals and Risk Factors at Admission - 08/12/17 0936    Core Components/Risk Factors/Patient Goals on Admission           Weight Management  Yes;Weight Maintenance    Intervention  Weight Management: Develop a combined nutrition and exercise program designed to reach desired caloric intake, while maintaining appropriate intake of nutrient and fiber, sodium and fats, and appropriate energy expenditure required for the weight goal.;Weight Management: Provide education and appropriate resources to help participant work on and attain dietary goals.    Admit Weight  149 lb 0.5 oz (67.6 kg)    Goal Weight: Short Term  149 lb (67.6 kg)    Goal Weight: Long Term  149 lb (67.6 kg)    Expected Outcomes  Short Term: Continue to assess and modify interventions until short term weight is achieved;Long Term: Adherence to nutrition and physical activity/exercise program aimed toward attainment of established weight goal;Weight Maintenance: Understanding of the daily nutrition guidelines, which includes 25-35% calories from fat, 7% or less cal from saturated fats, less than '200mg'$  cholesterol, less than 1.5gm of sodium, & 5 or more servings of fruits and vegetables daily;Understanding recommendations for meals to include 15-35% energy as protein, 25-35% energy from fat, 35-60% energy from carbohydrates, less than '200mg'$  of dietary cholesterol, 20-35 gm of total fiber daily;Understanding of distribution of calorie intake throughout the day with the consumption of 4-5 meals/snacks    Hypertension  Yes     Intervention  Provide education on lifestyle modifcations including regular physical activity/exercise, weight management, moderate sodium restriction and increased consumption of fresh fruit, vegetables, and low fat dairy, alcohol moderation, and smoking cessation.;Monitor prescription use compliance.    Expected Outcomes  Short Term: Continued assessment and intervention until BP is < 140/20m HG in hypertensive participants. < 130/81mHG in hypertensive participants with diabetes, heart failure or chronic kidney disease.;Long Term: Maintenance of blood pressure at goal levels.    Lipids  Yes    Intervention  Provide education and support for participant on nutrition & aerobic/resistive exercise along with prescribed medications to achieve LDL '70mg'$ , HDL >'40mg'$ .    Expected Outcomes  Short Term: Participant states understanding of desired cholesterol values and is compliant with medications prescribed. Participant is following exercise prescription and nutrition guidelines.;Long Term: Cholesterol controlled with medications as prescribed, with individualized exercise RX and with personalized nutrition plan. Value goals: LDL < '70mg'$ , HDL > 40 mg.           Core Components/Risk Factors/Patient Goals Review:  Goals and Risk Factor Review    Core Components/Risk Factors/Patient Goals Review    Row Name 08/20/17 1217 09/16/17 1134 10/15/17 1712   Personal Goals Review  Weight Management/Obesity;Hypertension;Lipids  Weight Management/Obesity;Hypertension;Lipids  Weight Management/Obesity;Hypertension;Lipids   Review  pt with CAD RF demonstrates willingness to participate in CR program. pt personal goal to resume pleasure activities including yardwork and playing golf. pt also eager to finish healing process and restore health. pt encouraged to participate in CR exercise, nutrition and lifestyle modification opporunities.   no documentation  pt with CAD RF demonstrates willingness to participate in CR  program. pt personal goal to resume pleasure activities including yardwork and playing golf. pt also eager to finish healing process and restore health. pt stamina improved, MET level increased.  Expected Outcomes  pt will participate in CR exercise, nutrition and lifestyle modification to decrease overall RF.   no documentation  pt will participate in CR exercise, nutrition and lifestyle modification to decrease overall RF.           Core Components/Risk Factors/Patient Goals at Discharge (Final Review):  Goals and Risk Factor Review - 10/15/17 1712    Core Components/Risk Factors/Patient Goals Review          Personal Goals Review  Weight Management/Obesity;Hypertension;Lipids    Review  pt with CAD RF demonstrates willingness to participate in CR program. pt personal goal to resume pleasure activities including yardwork and playing golf. pt also eager to finish healing process and restore health. pt stamina improved, MET level increased.      Expected Outcomes  pt will participate in CR exercise, nutrition and lifestyle modification to decrease overall RF.            ITP Comments: ITP Comments    Row Name 08/12/17 0934 08/20/17 1215 09/16/17 1133 10/15/17 1712   ITP Comments  Dr. Fransico Him, Medical Director  pt started group exercise program. pt tolerated light activity without difficulty. pt oriented to exercise equipment and safety routine.    30 day ITP review. pt with good attendance and participation. pt demonstrates willingness to participate in CR activiites.   30 day ITP review. pt with good attendance and participation. pt demonstrates willingness to participate in CR activiites.       Comments:

## 2017-10-17 ENCOUNTER — Encounter (HOSPITAL_COMMUNITY)
Admission: RE | Admit: 2017-10-17 | Discharge: 2017-10-17 | Disposition: A | Discharge status: Home / Self Care | Payer: Medicare HMO | Source: Ambulatory Visit | Attending: Interventional Cardiology | Admitting: Interventional Cardiology

## 2017-10-17 DIAGNOSIS — I10 Essential (primary) hypertension: Secondary | ICD-10-CM | POA: Diagnosis not present

## 2017-10-17 DIAGNOSIS — Z7984 Long term (current) use of oral hypoglycemic drugs: Secondary | ICD-10-CM | POA: Diagnosis not present

## 2017-10-17 DIAGNOSIS — Z951 Presence of aortocoronary bypass graft: Secondary | ICD-10-CM

## 2017-10-17 DIAGNOSIS — I2510 Atherosclerotic heart disease of native coronary artery without angina pectoris: Secondary | ICD-10-CM | POA: Diagnosis not present

## 2017-10-17 DIAGNOSIS — E039 Hypothyroidism, unspecified: Secondary | ICD-10-CM | POA: Diagnosis not present

## 2017-10-17 DIAGNOSIS — Z7982 Long term (current) use of aspirin: Secondary | ICD-10-CM | POA: Diagnosis not present

## 2017-10-17 DIAGNOSIS — Z79890 Hormone replacement therapy (postmenopausal): Secondary | ICD-10-CM | POA: Diagnosis not present

## 2017-10-17 DIAGNOSIS — Z79899 Other long term (current) drug therapy: Secondary | ICD-10-CM | POA: Diagnosis not present

## 2017-10-17 DIAGNOSIS — D696 Thrombocytopenia, unspecified: Secondary | ICD-10-CM | POA: Diagnosis not present

## 2017-10-20 ENCOUNTER — Encounter (HOSPITAL_COMMUNITY)
Admission: RE | Admit: 2017-10-20 | Discharge: 2017-10-20 | Disposition: A | Discharge status: Home / Self Care | Payer: Medicare HMO | Source: Ambulatory Visit | Attending: Interventional Cardiology | Admitting: Interventional Cardiology

## 2017-10-20 DIAGNOSIS — I2510 Atherosclerotic heart disease of native coronary artery without angina pectoris: Secondary | ICD-10-CM | POA: Diagnosis not present

## 2017-10-20 DIAGNOSIS — Z79890 Hormone replacement therapy (postmenopausal): Secondary | ICD-10-CM | POA: Diagnosis not present

## 2017-10-20 DIAGNOSIS — Z7982 Long term (current) use of aspirin: Secondary | ICD-10-CM | POA: Diagnosis not present

## 2017-10-20 DIAGNOSIS — I10 Essential (primary) hypertension: Secondary | ICD-10-CM | POA: Diagnosis not present

## 2017-10-20 DIAGNOSIS — Z951 Presence of aortocoronary bypass graft: Secondary | ICD-10-CM

## 2017-10-20 DIAGNOSIS — Z7984 Long term (current) use of oral hypoglycemic drugs: Secondary | ICD-10-CM | POA: Diagnosis not present

## 2017-10-20 DIAGNOSIS — E039 Hypothyroidism, unspecified: Secondary | ICD-10-CM | POA: Diagnosis not present

## 2017-10-20 DIAGNOSIS — D696 Thrombocytopenia, unspecified: Secondary | ICD-10-CM | POA: Diagnosis not present

## 2017-10-20 DIAGNOSIS — Z79899 Other long term (current) drug therapy: Secondary | ICD-10-CM | POA: Diagnosis not present

## 2017-10-20 DIAGNOSIS — H4311 Vitreous hemorrhage, right eye: Secondary | ICD-10-CM | POA: Diagnosis not present

## 2017-10-20 DIAGNOSIS — H43811 Vitreous degeneration, right eye: Secondary | ICD-10-CM | POA: Diagnosis not present

## 2017-10-21 DIAGNOSIS — H02401 Unspecified ptosis of right eyelid: Secondary | ICD-10-CM | POA: Diagnosis not present

## 2017-10-21 DIAGNOSIS — H4311 Vitreous hemorrhage, right eye: Secondary | ICD-10-CM | POA: Diagnosis not present

## 2017-10-21 DIAGNOSIS — H33312 Horseshoe tear of retina without detachment, left eye: Secondary | ICD-10-CM | POA: Diagnosis not present

## 2017-10-21 DIAGNOSIS — H35373 Puckering of macula, bilateral: Secondary | ICD-10-CM | POA: Diagnosis not present

## 2017-10-21 DIAGNOSIS — H35412 Lattice degeneration of retina, left eye: Secondary | ICD-10-CM | POA: Diagnosis not present

## 2017-10-21 DIAGNOSIS — H33311 Horseshoe tear of retina without detachment, right eye: Secondary | ICD-10-CM | POA: Diagnosis not present

## 2017-10-21 DIAGNOSIS — H43811 Vitreous degeneration, right eye: Secondary | ICD-10-CM | POA: Diagnosis not present

## 2017-10-21 DIAGNOSIS — H35411 Lattice degeneration of retina, right eye: Secondary | ICD-10-CM | POA: Diagnosis not present

## 2017-10-21 DIAGNOSIS — H3122 Choroidal dystrophy (central areolar) (generalized) (peripapillary): Secondary | ICD-10-CM | POA: Diagnosis not present

## 2017-10-22 ENCOUNTER — Encounter (HOSPITAL_COMMUNITY)
Admission: RE | Admit: 2017-10-22 | Discharge: 2017-10-22 | Disposition: A | Discharge status: Home / Self Care | Payer: Medicare HMO | Source: Ambulatory Visit | Attending: Interventional Cardiology | Admitting: Interventional Cardiology

## 2017-10-22 VITALS — Ht 67.0 in | Wt 146.2 lb

## 2017-10-22 DIAGNOSIS — E039 Hypothyroidism, unspecified: Secondary | ICD-10-CM | POA: Diagnosis not present

## 2017-10-22 DIAGNOSIS — Z7984 Long term (current) use of oral hypoglycemic drugs: Secondary | ICD-10-CM | POA: Diagnosis not present

## 2017-10-22 DIAGNOSIS — Z79890 Hormone replacement therapy (postmenopausal): Secondary | ICD-10-CM | POA: Diagnosis not present

## 2017-10-22 DIAGNOSIS — D696 Thrombocytopenia, unspecified: Secondary | ICD-10-CM | POA: Diagnosis not present

## 2017-10-22 DIAGNOSIS — I10 Essential (primary) hypertension: Secondary | ICD-10-CM | POA: Diagnosis not present

## 2017-10-22 DIAGNOSIS — Z79899 Other long term (current) drug therapy: Secondary | ICD-10-CM | POA: Diagnosis not present

## 2017-10-22 DIAGNOSIS — I2510 Atherosclerotic heart disease of native coronary artery without angina pectoris: Secondary | ICD-10-CM | POA: Diagnosis not present

## 2017-10-22 DIAGNOSIS — Z951 Presence of aortocoronary bypass graft: Secondary | ICD-10-CM | POA: Diagnosis not present

## 2017-10-22 DIAGNOSIS — Z7982 Long term (current) use of aspirin: Secondary | ICD-10-CM | POA: Diagnosis not present

## 2017-10-24 ENCOUNTER — Inpatient Hospital Stay (HOSPITAL_COMMUNITY): Payer: Medicare HMO | Admitting: Certified Registered"

## 2017-10-24 ENCOUNTER — Encounter (HOSPITAL_COMMUNITY): Payer: Self-pay

## 2017-10-24 ENCOUNTER — Encounter (HOSPITAL_COMMUNITY): Payer: Medicare HMO

## 2017-10-24 ENCOUNTER — Encounter (HOSPITAL_COMMUNITY)
Admission: RE | Disposition: A | Discharge status: Home / Self Care | Payer: Self-pay | Source: Ambulatory Visit | Attending: Ophthalmology

## 2017-10-24 ENCOUNTER — Ambulatory Visit (HOSPITAL_COMMUNITY)
Admission: RE | Admit: 2017-10-24 | Discharge: 2017-10-24 | Disposition: A | Discharge status: Home / Self Care | Payer: Medicare HMO | Source: Ambulatory Visit | Attending: Ophthalmology | Admitting: Ophthalmology

## 2017-10-24 DIAGNOSIS — E039 Hypothyroidism, unspecified: Secondary | ICD-10-CM | POA: Insufficient documentation

## 2017-10-24 DIAGNOSIS — Z800 Family history of malignant neoplasm of digestive organs: Secondary | ICD-10-CM | POA: Insufficient documentation

## 2017-10-24 DIAGNOSIS — H02401 Unspecified ptosis of right eyelid: Secondary | ICD-10-CM | POA: Diagnosis not present

## 2017-10-24 DIAGNOSIS — Z951 Presence of aortocoronary bypass graft: Secondary | ICD-10-CM | POA: Insufficient documentation

## 2017-10-24 DIAGNOSIS — I25110 Atherosclerotic heart disease of native coronary artery with unstable angina pectoris: Secondary | ICD-10-CM | POA: Insufficient documentation

## 2017-10-24 DIAGNOSIS — D696 Thrombocytopenia, unspecified: Secondary | ICD-10-CM | POA: Diagnosis not present

## 2017-10-24 DIAGNOSIS — H35411 Lattice degeneration of retina, right eye: Secondary | ICD-10-CM | POA: Diagnosis not present

## 2017-10-24 DIAGNOSIS — H4311 Vitreous hemorrhage, right eye: Secondary | ICD-10-CM | POA: Diagnosis not present

## 2017-10-24 DIAGNOSIS — Z86010 Personal history of colonic polyps: Secondary | ICD-10-CM | POA: Diagnosis not present

## 2017-10-24 DIAGNOSIS — I10 Essential (primary) hypertension: Secondary | ICD-10-CM | POA: Insufficient documentation

## 2017-10-24 DIAGNOSIS — H33021 Retinal detachment with multiple breaks, right eye: Secondary | ICD-10-CM | POA: Diagnosis not present

## 2017-10-24 DIAGNOSIS — H43811 Vitreous degeneration, right eye: Secondary | ICD-10-CM | POA: Diagnosis not present

## 2017-10-24 DIAGNOSIS — H3122 Choroidal dystrophy (central areolar) (generalized) (peripapillary): Secondary | ICD-10-CM | POA: Diagnosis not present

## 2017-10-24 HISTORY — PX: GAS/FLUID EXCHANGE: SHX5334

## 2017-10-24 HISTORY — PX: PHOTOCOAGULATION: SHX5303

## 2017-10-24 HISTORY — PX: PARS PLANA VITRECTOMY: SHX2166

## 2017-10-24 LAB — CBC
HEMATOCRIT: 43.4 % (ref 39.0–52.0)
Hemoglobin: 13.2 g/dL (ref 13.0–17.0)
MCH: 23.5 pg — ABNORMAL LOW (ref 26.0–34.0)
MCHC: 30.4 g/dL (ref 30.0–36.0)
MCV: 77.4 fL — AB (ref 78.0–100.0)
Platelets: 86 10*3/uL — ABNORMAL LOW (ref 150–400)
RBC: 5.61 MIL/uL (ref 4.22–5.81)
RDW: 21.4 % — ABNORMAL HIGH (ref 11.5–15.5)
WBC: 8.6 10*3/uL (ref 4.0–10.5)

## 2017-10-24 LAB — BASIC METABOLIC PANEL
ANION GAP: 12 (ref 5–15)
BUN: 16 mg/dL (ref 8–23)
CALCIUM: 8.8 mg/dL — AB (ref 8.9–10.3)
CHLORIDE: 106 mmol/L (ref 98–111)
CO2: 21 mmol/L — ABNORMAL LOW (ref 22–32)
CREATININE: 1.1 mg/dL (ref 0.61–1.24)
GFR calc Af Amer: >60 mL/min (ref >60)
Glucose, Bld: 96 mg/dL (ref 70–99)
POTASSIUM: 4 mmol/L (ref 3.5–5.1)
Sodium: 139 mmol/L (ref 135–145)

## 2017-10-24 SURGERY — PARS PLANA VITRECTOMY WITH 25 GAUGE
Anesthesia: Monitor Anesthesia Care | Site: Eye | Laterality: Right

## 2017-10-24 MED ORDER — EPINEPHRINE PF 1 MG/ML IJ SOLN
INTRAOCULAR | Status: DC | PRN
  Administered 2017-10-24: 17:00:00

## 2017-10-24 MED ORDER — PROPOFOL 10 MG/ML IV BOLUS
INTRAVENOUS | Status: DC | PRN
  Administered 2017-10-24: 50 mg via INTRAVENOUS

## 2017-10-24 MED ORDER — LIDOCAINE HCL (PF) 1 % IJ SOLN
INTRAMUSCULAR | Status: DC | PRN
  Administered 2017-10-24: 10 mL

## 2017-10-24 MED ORDER — TETRACAINE HCL 0.5 % OP SOLN
OPHTHALMIC | Status: AC
  Filled 2017-10-24: qty 4

## 2017-10-24 MED ORDER — OFLOXACIN 0.3 % OP SOLN
1.0000 [drp] | OPHTHALMIC | Status: AC | PRN
  Administered 2017-10-24 (×3): 1 [drp] via OPHTHALMIC
  Filled 2017-10-24: qty 5

## 2017-10-24 MED ORDER — CYCLOPENTOLATE HCL 1 % OP SOLN
1.0000 [drp] | OPHTHALMIC | Status: AC | PRN
  Administered 2017-10-24 (×3): 1 [drp] via OPHTHALMIC
  Filled 2017-10-24: qty 2

## 2017-10-24 MED ORDER — TROPICAMIDE 1 % OP SOLN
1.0000 [drp] | OPHTHALMIC | Status: AC | PRN
  Administered 2017-10-24 (×3): 1 [drp] via OPHTHALMIC
  Filled 2017-10-24: qty 15

## 2017-10-24 MED ORDER — LIDOCAINE HCL 2 % IJ SOLN
INTRAMUSCULAR | Status: AC
  Filled 2017-10-24: qty 20

## 2017-10-24 MED ORDER — BSS IO SOLN
INTRAOCULAR | Status: AC
  Filled 2017-10-24: qty 15

## 2017-10-24 MED ORDER — BUPIVACAINE HCL (PF) 0.75 % IJ SOLN
INTRAMUSCULAR | Status: AC
  Filled 2017-10-24: qty 10

## 2017-10-24 MED ORDER — HYPROMELLOSE (GONIOSCOPIC) 2.5 % OP SOLN
OPHTHALMIC | Status: AC
  Filled 2017-10-24: qty 15

## 2017-10-24 MED ORDER — ATROPINE SULFATE 1 % OP SOLN
OPHTHALMIC | Status: AC
  Filled 2017-10-24: qty 5

## 2017-10-24 MED ORDER — EPINEPHRINE PF 1 MG/ML IJ SOLN
INTRAMUSCULAR | Status: AC
  Filled 2017-10-24: qty 1

## 2017-10-24 MED ORDER — PHENYLEPHRINE HCL 10 % OP SOLN
1.0000 [drp] | Freq: Once | OPHTHALMIC | Status: AC
  Administered 2017-10-24: 1 [drp] via OPHTHALMIC
  Filled 2017-10-24: qty 5

## 2017-10-24 MED ORDER — CEFAZOLIN SODIUM 1 G IJ SOLR
INTRAMUSCULAR | Status: DC | PRN
  Administered 2017-10-24: 100 mg

## 2017-10-24 MED ORDER — PROPOFOL 10 MG/ML IV BOLUS
INTRAVENOUS | Status: AC
  Filled 2017-10-24: qty 20

## 2017-10-24 MED ORDER — STERILE WATER FOR INJECTION IJ SOLN
INTRAMUSCULAR | Status: DC | PRN
  Administered 2017-10-24: 500 mL

## 2017-10-24 MED ORDER — CEFAZOLIN SUBCONJUNCTIVAL INJECTION 100 MG/0.5 ML
100.0000 mg | INJECTION | SUBCONJUNCTIVAL | Status: DC
  Filled 2017-10-24: qty 5

## 2017-10-24 MED ORDER — BSS PLUS IO SOLN
INTRAOCULAR | Status: AC
  Filled 2017-10-24: qty 500

## 2017-10-24 MED ORDER — TETRACAINE 0.5 % OP SOLN OPTIME - NO CHARGE
OPHTHALMIC | Status: DC | PRN
  Administered 2017-10-24: 2 [drp] via OPHTHALMIC

## 2017-10-24 MED ORDER — DEXAMETHASONE SODIUM PHOSPHATE 10 MG/ML IJ SOLN
INTRAMUSCULAR | Status: DC | PRN
  Administered 2017-10-24: 10 mg

## 2017-10-24 MED ORDER — SODIUM CHLORIDE 0.9 % IV SOLN
INTRAVENOUS | Status: DC
  Administered 2017-10-24: 16:00:00 via INTRAVENOUS

## 2017-10-24 MED ORDER — HYPROMELLOSE (GONIOSCOPIC) 2.5 % OP SOLN
OPHTHALMIC | Status: DC | PRN
  Administered 2017-10-24: 2 [drp] via OPHTHALMIC

## 2017-10-24 MED ORDER — DEXAMETHASONE SODIUM PHOSPHATE 10 MG/ML IJ SOLN
INTRAMUSCULAR | Status: AC
  Filled 2017-10-24: qty 1

## 2017-10-24 MED ORDER — FENTANYL CITRATE (PF) 250 MCG/5ML IJ SOLN
INTRAMUSCULAR | Status: AC
  Filled 2017-10-24: qty 5

## 2017-10-24 MED ORDER — PHENYLEPHRINE 40 MCG/ML (10ML) SYRINGE FOR IV PUSH (FOR BLOOD PRESSURE SUPPORT)
PREFILLED_SYRINGE | INTRAVENOUS | Status: AC
  Filled 2017-10-24: qty 10

## 2017-10-24 MED ORDER — BUPIVACAINE HCL (PF) 0.75 % IJ SOLN
INTRAMUSCULAR | Status: DC | PRN
  Administered 2017-10-24: 10 mL

## 2017-10-24 MED ORDER — HYALURONIDASE HUMAN 150 UNIT/ML IJ SOLN
INTRAMUSCULAR | Status: DC | PRN
  Administered 2017-10-24: 150 [IU]

## 2017-10-24 MED ORDER — INDOCYANINE GREEN 25 MG IV SOLR
INTRAVENOUS | Status: AC
  Filled 2017-10-24: qty 25

## 2017-10-24 MED ORDER — TOBRAMYCIN 0.3 % OP OINT
TOPICAL_OINTMENT | OPHTHALMIC | Status: DC | PRN
  Administered 2017-10-24: 1 via OPHTHALMIC

## 2017-10-24 MED ORDER — HYALURONIDASE HUMAN 150 UNIT/ML IJ SOLN
INTRAMUSCULAR | Status: AC
  Filled 2017-10-24: qty 1

## 2017-10-24 MED ORDER — TOBRAMYCIN-DEXAMETHASONE 0.3-0.1 % OP OINT
TOPICAL_OINTMENT | OPHTHALMIC | Status: AC
  Filled 2017-10-24: qty 3.5

## 2017-10-24 MED ORDER — BSS IO SOLN
INTRAOCULAR | Status: DC | PRN
  Administered 2017-10-24: 15 mL via INTRAOCULAR

## 2017-10-24 SURGICAL SUPPLY — 31 items
APL SRG 3 HI ABS STRL LF PLS (MISCELLANEOUS) ×2
APL SWBSTK 6 STRL LF DISP (MISCELLANEOUS) ×2
APPLICATOR COTTON TIP 6 STRL (MISCELLANEOUS) ×1 IMPLANT
APPLICATOR COTTON TIP 6IN STRL (MISCELLANEOUS) ×6 IMPLANT
APPLICATOR DR MATTHEWS STRL (MISCELLANEOUS) ×3 IMPLANT
CABLE BIPOLOR RESECTION CORD (MISCELLANEOUS) ×2 IMPLANT
CANNULA TROCAR 25 GA VLV (OPHTHALMIC) ×3 IMPLANT
CANNULA TROCAR 25G 4 VLV (OPHTHALMIC) IMPLANT
CANNULA VLV SOFT TIP 25G (OPHTHALMIC) IMPLANT
CANNULA VLV SOFT TIP 25GA (OPHTHALMIC) ×3 IMPLANT
GAS AUTO FILL CONSTEL (OPHTHALMIC) ×3
GAS AUTO FILL CONSTELLATION (OPHTHALMIC) ×1 IMPLANT
GLOVE ECLIPSE 7.5 STRL STRAW (GLOVE) ×3 IMPLANT
GOWN STRL REUS W/ TWL LRG LVL3 (GOWN DISPOSABLE) ×4 IMPLANT
GOWN STRL REUS W/TWL LRG LVL3 (GOWN DISPOSABLE) ×6
KIT BASIN OR (CUSTOM PROCEDURE TRAY) ×3 IMPLANT
KIT TURNOVER KIT B (KITS) ×3 IMPLANT
LENS BIOM SUPER VIEW SET DISP (OPHTHALMIC RELATED) ×2 IMPLANT
NDL RETROBULBAR 25GX1.5 (NEEDLE) IMPLANT
NEEDLE RETROBULBAR 25GX1.5 (NEEDLE) ×3 IMPLANT
NS IRRIG 1000ML POUR BTL (IV SOLUTION) ×3 IMPLANT
PACK CATARACT/VITRECTOMY 25GA (OPHTHALMIC) ×2 IMPLANT
PACK VITRECTOMY CUSTOM (CUSTOM PROCEDURE TRAY) ×3 IMPLANT
PAD ARMBOARD 7.5X6 YLW CONV (MISCELLANEOUS) ×4 IMPLANT
PAK VITRECTOMY PIK 25 GA (OPHTHALMIC RELATED) ×2 IMPLANT
SHEET MEDIUM DRAPE 40X70 STRL (DRAPES) ×3 IMPLANT
SOLUTION ANTI FOG 6CC (MISCELLANEOUS) ×1 IMPLANT
STRIP CLOSURE SKIN 1/2X4 (GAUZE/BANDAGES/DRESSINGS) ×2 IMPLANT
SYR 3ML LL SCALE MARK (SYRINGE) ×2 IMPLANT
TOWEL OR 17X24 6PK STRL BLUE (TOWEL DISPOSABLE) ×4 IMPLANT
WATER STERILE IRR 1000ML POUR (IV SOLUTION) ×3 IMPLANT

## 2017-10-24 NOTE — Anesthesia Postprocedure Evaluation (Signed)
Anesthesia Post Note  Patient: Wayne Mitchell  Procedure(s) Performed: PARS PLANA VITRECTOMY WITH 25 GAUGE (Right Eye) PHOTOCOAGULATION (Right Eye) GAS/FLUID EXCHANGE (Right Eye)     Patient location during evaluation: PACU Anesthesia Type: MAC Level of consciousness: awake and alert Pain management: pain level controlled Vital Signs Assessment: post-procedure vital signs reviewed and stable Respiratory status: spontaneous breathing, nonlabored ventilation, respiratory function stable and patient connected to nasal cannula oxygen Cardiovascular status: stable and blood pressure returned to baseline Postop Assessment: no apparent nausea or vomiting Anesthetic complications: no    Last Vitals:  Vitals:   10/24/17 1539 10/24/17 1915  BP: (!) 168/57 (!) 173/89  Pulse: 83   Resp: 16 12  Temp: 36.9 C 36.6 C  SpO2: 98%     Last Pain:  Vitals:   10/24/17 1945  TempSrc:   PainSc: 0-No pain                 Tiajuana Amass

## 2017-10-24 NOTE — Anesthesia Preprocedure Evaluation (Addendum)
Anesthesia Evaluation  Patient identified by MRN, date of birth, ID band Patient awake    Reviewed: Allergy & Precautions, NPO status , Patient's Chart, lab work & pertinent test results  History of Anesthesia Complications Negative for: history of anesthetic complications  Airway Mallampati: II  TM Distance: >3 FB Neck ROM: Full    Dental  (+) Caps, Dental Advisory Given   Pulmonary neg pulmonary ROS,    Pulmonary exam normal breath sounds clear to auscultation       Cardiovascular hypertension, Pt. on medications + angina + CAD and + Peripheral Vascular Disease  Normal cardiovascular exam Rhythm:Regular Rate:Normal  06/01/17 ECHO: EF 45-50%, inferior hypokinesis, mild AI, Mild MR   Neuro/Psych Pt with probable vaso-vagal dizziness with lab draw this am negative psych ROS   GI/Hepatic Neg liver ROS, GERD  ,  Endo/Other  Hypothyroidism   Renal/GU Renal disease: creat 1.41.  negative genitourinary   Musculoskeletal   Abdominal Normal abdominal exam  (+)   Peds  Hematology negative hematology ROS (+) plt 116k   Anesthesia Other Findings Wayne Mitchell  ECHO COMPLETE WO IMAGING ENHANCING AGENT  Order# [id number]  Reading physician: Chrystie Nose, MD Ordering physician: Kerin Perna, MD Study date: 06/05/17 Study Result   Result status: Final result                             *[facility]*                   *[facility]*                         [address]                        [place], [place] [address]                            [phone]  ------------------------------------------------------------------- Transthoracic Echocardiography  Patient:    Wayne Mitchell, Wayne Mitchell MR #:       [id number] Study Date: 06/05/2017 Gender:     M Age:        82 Height:     170.2 cm Weight:     80.2 kg BSA:        1.97 m^2 Pt. Status: Room:       2H19C   ATTENDING    Rochel Brome  REFERRING    VanTrigt, Genelle Gather    [name], MD  PERFORMING   [facility], Inpatient  SONOGRAPHER  Sheralyn Boatman  cc:  ------------------------------------------------------------------- LV EF: 60% -   65%  ------------------------------------------------------------------- Indications:      Cardiomyopathy - ischemic 414.8.  ------------------------------------------------------------------- History:   PMH:  SVT. Dizziness.  Syncope.  Angina pectoris. Coronary artery disease.  Risk factors:  Hypertension.  ------------------------------------------------------------------- Study Conclusions  - Procedure narrative: Transthoracic echocardiography. Image   quality was poor. The study was technically difficult, as a   result of poor acoustic windows and poor sound wave transmission. - Left ventricle: The cavity size was normal. Wall thickness was   increased in a pattern of moderate LVH. Systolic function was   normal. The estimated ejection fraction was in the range of 60%   to 65%. Incoordinate septal motion. Doppler parameters are  consistent with abnormal left ventricular relaxation (grade 1   diastolic dysfunction). The E/e&' ratio is >15, suggesting   elevated LV filling pressure. - Aortic valve: Poorly visualized. There was trivial regurgitation. - Mitral valve: Mildly thickened leaflets . There was trivial   regurgitation. - Left atrium: The atrium was normal in size.  Impressions:  - Technically difficult study. LVEF 60-65%, moderate LVH,   incoordinate septal motion, grade 1 DD, elevated LV filling   pressure, trivial AI, trivial MR, normal LA size.  ------------------------------------------------------------------- Labs, prior tests, procedures, and surgery: Coronary artery bypass grafting.  ------------------------------------------------------------------- Study data:  Comparison was made to the study of 06/01/2017.   Study status:  Routine.  Procedure:  Imaging difficult. Chest tube present. Wound dressing in subcostal region. Windows were extremely respiratory dependent. The patient reported no pain pre or post test. Transthoracic echocardiography. Image quality was poor. The study was technically difficult, as a result of poor acoustic windows and poor sound wave transmission.  Study completion:  There were no complications.          Transthoracic echocardiography. M-mode, complete 2D, spectral Doppler, and color Doppler. Birthdate:  Patient birthdate: [birth year].  Age:  Patient is 82 yr old.  Sex:  Gender: male.    BMI: 27.7 kg/m^2.  Blood pressure: 125/68  Patient status:  Inpatient.  Study date:  Study date: 06/05/2017. Study time: 10:31 AM.  Location:  ICU/CCU  -------------------------------------------------------------------  ------------------------------------------------------------------- Left ventricle:  The cavity size was normal. Wall thickness was increased in a pattern of moderate LVH. Systolic function was normal. The estimated ejection fraction was in the range of 60% to 65%. Incoordinate septal motion. Doppler parameters are consistent with abnormal left ventricular relaxation (grade 1 diastolic dysfunction). The E/e&' ratio is >15, suggesting elevated LV filling pressure.  ------------------------------------------------------------------- Aortic valve:  Poorly visualized.  Doppler:  There was trivial regurgitation.  ------------------------------------------------------------------- Aorta:  Aortic root: The aortic root was normal in size. Ascending aorta: The ascending aorta was normal in size.  ------------------------------------------------------------------- Mitral valve:   Mildly thickened leaflets .  Doppler:  There was trivial regurgitation.    Valve area by pressure half-time: 4.23 cm^2. Indexed valve area by pressure half-time: 2.15 cm^2/m^2. Peak gradient  (D): 3 mm Hg.  ------------------------------------------------------------------- Left atrium:  The atrium was normal in size.  ------------------------------------------------------------------- Atrial septum:  Poorly visualized.  ------------------------------------------------------------------- Right ventricle:  Poorly visualized.  ------------------------------------------------------------------- Pulmonic valve:   Poorly visualized.  Doppler:  There was no significant regurgitation.  ------------------------------------------------------------------- Tricuspid valve:   Doppler:  There was no significant regurgitation.  ------------------------------------------------------------------- Pulmonary artery:   Poorly visualized.  ------------------------------------------------------------------- Right atrium:  The atrium was normal in size.  ------------------------------------------------------------------- Pericardium:  There was no pericardial effusion.  ------------------------------------------------------------------- Systemic veins:  Not visualized.  ------------------------------------------------------------------- Measurements   Left ventricle                           Value          Reference  LV ID, ED, PLAX chordal        (L)       31.9  mm       43 - 52  LV ID, ES, PLAX chordal        (L)       20.6  mm       23 - 38  LV fx shortening, PLAX chordal           35    %        >=  29  LV PW thickness, ED                      13.7  mm       ----------  IVS/LV PW ratio, ED                      1.04           <=1.3  Stroke volume, 2D                        53    ml       ----------  Stroke volume/bsa, 2D                    27    ml/m^2   ----------  LV ejection fraction, 1-p A4C            50    %        ----------  LV end-diastolic volume, 2-p             57    ml       ----------  LV end-systolic volume, 2-p              23    ml       ----------  LV  ejection fraction, 2-p                59    %        ----------  Stroke volume, 2-p                       34    ml       ----------  LV end-diastolic volume/bsa,             29    ml/m^2   ----------  2-p  LV end-systolic volume/bsa,              12    ml/m^2   ----------  2-p  Stroke volume/bsa, 2-p                   17.1  ml/m^2   ----------  LV e&', lateral                           4.68  cm/s     ----------  LV E/e&', lateral                         19.06          ----------  LV e&', medial                            4.46  cm/s     ----------  LV E/e&', medial                          20             ----------  LV e&', average                           4.57  cm/s     ----------  LV E/e&', average  19.52          ----------    Ventricular septum                       Value          Reference  IVS thickness, ED                        14.2  mm       ----------    LVOT                                     Value          Reference  LVOT ID, S                               21    mm       ----------  LVOT area                                3.46  cm^2     ----------  LVOT peak velocity, S                    103   cm/s     ----------  LVOT mean velocity, S                    63.7  cm/s     ----------  LVOT VTI, S                              15.4  cm       ----------  LVOT peak gradient, S                    4     mm Hg    ----------    Aortic valve                             Value          Reference  Aortic regurg pressure                   554   ms       ----------  half-time    Aorta                                    Value          Reference  Aortic root ID, ED                       32    mm       ----------    Left atrium                              Value          Reference  LA ID, A-P, ES  34    mm       ----------  LA ID/bsa, A-P                           1.73  cm/m^2   <=2.2  LA volume, S                             19.7  ml        ----------  LA volume/bsa, S                         10    ml/m^2   ----------  LA volume, ES, 1-p A4C                   14.5  ml       ----------  LA volume/bsa, ES, 1-p A4C               7.4   ml/m^2   ----------  LA volume, ES, 1-p A2C                   25.4  ml       ----------  LA volume/bsa, ES, 1-p A2C               12.9  ml/m^2   ----------    Mitral valve                             Value          Reference  Mitral E-wave peak velocity              89.2  cm/s     ----------  Mitral A-wave peak velocity              62.6  cm/s     ----------  Mitral deceleration time                 176   ms       150 - 230  Mitral pressure half-time                52    ms       ----------  Mitral peak gradient, D                  3     mm Hg    ----------  Mitral E/A ratio, peak                   1.4            ----------  Mitral valve area, PHT, DP               4.23  cm^2     ----------  Mitral valve area/bsa, PHT, DP           2.15  cm^2/m^2 ----------    Right atrium                             Value          Reference  RA ID, S-I, ES, A4C                      48  mm       34 - 49  RA area, ES, A4C                         12.3  cm^2     8.3 - 19.5  RA volume, ES, A/L                       25.2  ml       ----------  RA volume/bsa, ES, A/L                   12.8  ml/m^2   ----------  Legend: (L)  and  (H)  mark values outside specified reference range.  ------------------------------------------------------------------- Prepared and Electronically Authenticated by  Zoila Shutter MD 2019-04-25T15:21:50 [facility] Images   Show images for ECHOCARDIOGRAM COMPLETE Patient Information   Patient Name Wayne Mitchell, Wayne Mitchell Sex Male DOB 04-17-1935 SSN [id number] Reason for Exam  Priority: Routine  Comments:  Surgical History   Surgical History    Procedure Laterality Date Comment Source CORONARY ARTERY BYPASS GRAFT N/A 06/02/2017 Procedure: CORONARY ARTERY BYPASS GRAFTING (CABG) x3  Using Left Internal Mammary Artery and Right Leg Greater Saphenous Vein harvested endoscopically; Surgeon: Kerin Perna, MD; Location: [facility] OR; Service: Open Heart Surgery; Laterality: N/A; Provider  Other Surgical History    Procedure Laterality Date Comment Source CATARACT EXTRACTION Left   Provider ESOPHAGOGASTRODUODENOSCOPY N/A 10/23/2014 Procedure: ESOPHAGOGASTRODUODENOSCOPY (EGD); Surgeon: Bernette Redbird, MD; Location: [facility] ENDOSCOPY; Service: Endoscopy; Laterality: N/A; Provider HEMORRHOID SURGERY    Provider LEFT HEART CATH AND CORONARY ANGIOGRAPHY N/A 05/30/2017 Procedure: LEFT HEART CATH AND CORONARY ANGIOGRAPHY; Surgeon: [name], MD; Location: [facility] INVASIVE CV LAB; Service: Cardiovascular; Laterality: N/A; Provider REFRACTIVE SURGERY Right   Provider TEE WITHOUT CARDIOVERSION N/A 06/02/2017 Procedure: TRANSESOPHAGEAL ECHOCARDIOGRAM (TEE); Surgeon: Donata Clay, Theron Arista, MD; Location: [facility] OR; Service: Open Heart Surgery; Laterality: N/A; Provider  Patient Data   Height  67 in  BP  125/68 mmHg    Performing Technologist/Nurse   Performing Technologist/Nurse: Janalyn Harder, RDCS          Implants    No active implants to display in this view. Order-Level Documents - 05/30/2017:   Scan on 06/10/2017 12:12 PM by Default, Provider, MDScan on 06/10/2017 12:12 PM by Default, Provider, MD    Encounter-Level Documents - 05/30/2017:   Scan on 06/10/2017 12:31 PM by Default, Provider, MDScan on 06/10/2017 12:31 PM by Default, Provider, MD  Scan on 06/10/2017 12:20 PM by Default, Provider, MDScan on 06/10/2017 12:20 PM by Default, Provider, MD  Scan on 06/10/2017 12:10 PM by Default, Provider, MDScan on 06/10/2017 12:10 PM by Default, Provider, MD  Scan on 06/10/2017 12:10 PM by Default, Provider, MDScan on 06/10/2017 12:10 PM by Default, Provider, MD  Scan on 06/10/2017 12:07 PM by Default, Provider, MDScan on 06/10/2017 12:07 PM by Default, Provider, MD  Document on  06/09/2017 3:16 PM by Alphonzo Dublin, RN: IP After Visit Summary  Scan on 06/03/2017 11:45 AM by Default, Provider, MDScan on 06/03/2017 11:45 AM by Default, Provider, MD  Scan on 06/02/2017 7:47 AM by Kipp Brood, MDScan on 06/02/2017 7:47 AM by Kipp Brood, MD  Scan on 06/09/2017 9:03 PM by Default, Provider, MDScan on 06/09/2017 9:03 PM by Default, Provider, MD  Scan on 06/09/2017 12:07 AM by Default, Provider, MDScan on 06/09/2017 12:07 AM by Default, Provider, MD  Scan on 05/30/2017 1:41 PM by Default, Provider, MDScan on 05/30/2017 1:41  PM by Default, Provider, MD  Electronic signature on 05/30/2017 10:00 AM - Signed  Electronic signature on 05/30/2017 9:59 AM - Signed    Signed   Electronically signed by Chrystie Nose, MD on 06/05/17 at 1521 EDT Printable Result Report    Result Report  External Result Report    External Result Report     Reproductive/Obstetrics                            Anesthesia Physical  Anesthesia Plan  ASA: III  Anesthesia Plan: General   Post-op Pain Management:    Induction: Intravenous  PONV Risk Score and Plan: 2 and Treatment may vary due to age or medical condition and Ondansetron  Airway Management Planned: Oral ETT  Additional Equipment:   Intra-op Plan:   Post-operative Plan: Extubation in OR  Informed Consent: I have reviewed the patients History and Physical, chart, labs and discussed the procedure including the risks, benefits and alternatives for the proposed anesthesia with the patient or authorized representative who has indicated his/her understanding and acceptance.   Dental advisory given  Plan Discussed with: CRNA and Surgeon  Anesthesia Plan Comments:         Anesthesia Quick Evaluation

## 2017-10-24 NOTE — Op Note (Signed)
Ngoc Malla Boster 10/24/2017 Diagnosis: Retinal detachment with multiple breaks right eye  Procedure: Pars Plana Vitrectomy, Endolaser, Fluid Gas Exchange and SF6 gas tamponade Operative Eye:  right eye  Surgeon: Harrold Donath Estimated Blood Loss: minimal Specimens for Pathology:  None Complications: none   The  patient was prepped and draped in the usual fashion for ocular surgery on the  right eye .  A lid speculum was placed.  Infusion line and trocar was placed at the 8 o'clock position approximately 3.5 mm from the surgical limbus.   The infusion line was allowed to run and then clamped when placed at the cannula opening. The line was inserted and secured to the drape with an adhesive strip.   Active trocars/cannula were placed at the 10 and 2 o'clock positions approximately 3.5 mm from the surgical limbus. The cannula was visualized in the vitreous cavity.  The light pipe and vitreous cutter were inserted into the vitreous cavity and a core vitrectomy was performed.  Care taken to remove the vitreous up to the vitreous base for 360 degrees.   Scleral depression was used to perform a careful shaving of the vitreous base and relieve any traction on the retinal tears and associated superior detachment.  Scleral depression was then used to demarcate the multiple retinal tears and the periphery to the ora serrata 360 degrees.  A complete air-fluid exchange was performed.  16% SF6 gas was placed in the eye.  The superior cannulas were sequentially removed with concommitant tamponade using a cotton tipped applicator and noted to be air tight.  The infusion line and trocar were removed and the sclerotomy was noted to be air tight with normal intraocular pressure by digital palpapation.  Subconjunctival injections of  Dexamethasone 4mg /69ml was placed in the infero-medial quadrant.   The speculum and drapes were removed and the eye was patched with Polymixin/Bacitracin ophthalmic ointment.  An eye shield was placed and the patient was transferred alert and conversant with stable vital signs to the post operative recovery area.  The patient tolerated the procedure well and no complications were noted.  Harrold Donath MD

## 2017-10-24 NOTE — Discharge Instructions (Signed)
DO NOT SLEEP ON BACK, THE EYE PRESSURE CAN GO UP AND CAUSE VISION LOSS   SLEEP ON SIDE WITH NOSE TO PILLOW  DURING DAY KEEP FACE DOWN.  15 MINUTES EVERY 2 HOURS IT IS OK TO LOOK STRAIGHT AHEAD (USE BATHROOM, EAT, WALK, ETC.)  

## 2017-10-24 NOTE — Transfer of Care (Signed)
Immediate Anesthesia Transfer of Care Note  Patient: Wayne Mitchell  Procedure(s) Performed: PARS PLANA VITRECTOMY WITH 25 GAUGE (Right Eye) PHOTOCOAGULATION (Right Eye) GAS/FLUID EXCHANGE (Right Eye)  Patient Location: PACU  Anesthesia Type:MAC  Level of Consciousness: awake, alert  and oriented  Airway & Oxygen Therapy: Patient Spontanous Breathing  Post-op Assessment: Report given to RN and Post -op Vital signs reviewed and stable  Post vital signs: Reviewed and stable  Last Vitals:  Vitals Value Taken Time  BP 174/100 10/24/2017  7:15 PM  Temp    Pulse 89 10/24/2017  7:15 PM  Resp 15 10/24/2017  7:15 PM  SpO2 96 % 10/24/2017  7:15 PM  Vitals shown include unvalidated device data.  Last Pain:  Vitals:   10/24/17 1539  TempSrc: Oral  PainSc:          Complications: No apparent anesthesia complications

## 2017-10-24 NOTE — Progress Notes (Signed)
Green Social worker on right wrist

## 2017-10-24 NOTE — Brief Op Note (Signed)
10/24/2017  7:06 PM  PATIENT:  Wayne Mitchell  82 y.o. male  PRE-OPERATIVE DIAGNOSIS:  RETINAL DETACHMENT right eye with multiple breaks  POST-OPERATIVE DIAGNOSIS: RETINAL DETACHMENT right eye with multiple breaks  PROCEDURE:  Procedure(s) with comments:  Retinal detachment repair right eye PARS PLANA VITRECTOMY WITH 25 GAUGE (Right) PHOTOCOAGULATION (Right) GAS/FLUID EXCHANGE (Right) - SF6  SURGEON:  Surgeon(s) and Role:    * Jalene Mullet, MD - Primary  PHYSICIAN ASSISTANT:   ASSISTANTS: none   ANESTHESIA:   local and MAC  EBL:  minimal   BLOOD ADMINISTERED:none  DRAINS: none   LOCAL MEDICATIONS USED:  MARCAINE    and LIDOCAINE   SPECIMEN:  No Specimen  DISPOSITION OF SPECIMEN:  N/A  COUNTS:  YES  TOURNIQUET:  None  DICTATION: .Note written in EPIC  PLAN OF CARE: Discharge to home after PACU  PATIENT DISPOSITION:  PACU - hemodynamically stable.   Delay start of Pharmacological VTE agent (>24hrs) due to surgical blood loss or risk of bleeding: not applicable

## 2017-10-24 NOTE — H&P (Signed)
Date of examination:  10/24/17  Indication for surgery: Retinal detachment right eye  Pertinent past medical history:  Past Medical History:  Diagnosis Date  . Carotid artery disease (HCC)   . Colon polyp    small  . Coronary artery disease   . Coronary artery disease involving native coronary artery of native heart with unstable angina pectoris (HCC) 05/30/2017   3V CABG 06/02/17  . HTN (hypertension)   . Hypothyroidism   . Syncope   . Syncope   . Thrombocytopenia (HCC)     Pertinent ocular history: Vitreous hemorrhage and multiple retinal tears.  Pertinent family history:  Family History  Problem Relation Age of Onset  . Pancreatic cancer Mother   . Healthy Brother     General:  Healthy appearing patient in no distress.    Eyes:    Acuity OD 20/25  OS 20/20  cc  External: Within normal limits     Anterior segment: Within normal limits     Fundus: Shallow retinal detachment right eye with multiple retinal breaks     Impression: Retinal detachment right eye  Plan: Retinal detachment repair right eye  Harrold Donath

## 2017-10-25 ENCOUNTER — Encounter (HOSPITAL_COMMUNITY): Payer: Self-pay | Admitting: Ophthalmology

## 2017-10-27 ENCOUNTER — Telehealth (HOSPITAL_COMMUNITY): Payer: Self-pay | Admitting: Internal Medicine

## 2017-10-27 ENCOUNTER — Encounter (HOSPITAL_COMMUNITY): Payer: Medicare HMO

## 2017-10-27 DIAGNOSIS — I10 Essential (primary) hypertension: Secondary | ICD-10-CM | POA: Diagnosis not present

## 2017-10-27 DIAGNOSIS — R7303 Prediabetes: Secondary | ICD-10-CM | POA: Diagnosis not present

## 2017-10-27 DIAGNOSIS — I25810 Atherosclerosis of coronary artery bypass graft(s) without angina pectoris: Secondary | ICD-10-CM | POA: Diagnosis not present

## 2017-10-27 DIAGNOSIS — E7800 Pure hypercholesterolemia, unspecified: Secondary | ICD-10-CM | POA: Diagnosis not present

## 2017-10-27 DIAGNOSIS — N183 Chronic kidney disease, stage 3 (moderate): Secondary | ICD-10-CM | POA: Diagnosis not present

## 2017-10-27 DIAGNOSIS — E039 Hypothyroidism, unspecified: Secondary | ICD-10-CM | POA: Diagnosis not present

## 2017-10-29 ENCOUNTER — Encounter (HOSPITAL_COMMUNITY): Payer: Medicare HMO

## 2017-10-31 ENCOUNTER — Encounter (HOSPITAL_COMMUNITY): Payer: Medicare HMO

## 2017-11-03 ENCOUNTER — Encounter (HOSPITAL_COMMUNITY): Payer: Medicare HMO

## 2017-11-05 ENCOUNTER — Telehealth (HOSPITAL_COMMUNITY): Payer: Self-pay | Admitting: Cardiac Rehabilitation

## 2017-11-05 ENCOUNTER — Encounter (HOSPITAL_COMMUNITY): Payer: Medicare HMO

## 2017-11-05 NOTE — Telephone Encounter (Signed)
pc to assess reason for continued absence from cardiac rehab. LMOM.  Ileanna Gemmill, RN, BSN Cardiac Pulmonary Rehab  

## 2017-11-07 ENCOUNTER — Encounter (HOSPITAL_COMMUNITY): Payer: Medicare HMO

## 2017-11-07 ENCOUNTER — Telehealth (HOSPITAL_COMMUNITY): Payer: Self-pay | Admitting: Internal Medicine

## 2017-11-10 ENCOUNTER — Encounter (HOSPITAL_COMMUNITY)
Admission: RE | Admit: 2017-11-10 | Discharge: 2017-11-10 | Disposition: A | Discharge status: Home / Self Care | Payer: Medicare HMO | Source: Ambulatory Visit | Attending: Interventional Cardiology | Admitting: Interventional Cardiology

## 2017-11-10 DIAGNOSIS — I10 Essential (primary) hypertension: Secondary | ICD-10-CM | POA: Diagnosis not present

## 2017-11-10 DIAGNOSIS — Z951 Presence of aortocoronary bypass graft: Secondary | ICD-10-CM | POA: Diagnosis not present

## 2017-11-10 DIAGNOSIS — Z7984 Long term (current) use of oral hypoglycemic drugs: Secondary | ICD-10-CM | POA: Diagnosis not present

## 2017-11-10 DIAGNOSIS — I2510 Atherosclerotic heart disease of native coronary artery without angina pectoris: Secondary | ICD-10-CM | POA: Diagnosis not present

## 2017-11-10 DIAGNOSIS — Z7982 Long term (current) use of aspirin: Secondary | ICD-10-CM | POA: Diagnosis not present

## 2017-11-10 DIAGNOSIS — D696 Thrombocytopenia, unspecified: Secondary | ICD-10-CM | POA: Diagnosis not present

## 2017-11-10 DIAGNOSIS — Z79890 Hormone replacement therapy (postmenopausal): Secondary | ICD-10-CM | POA: Diagnosis not present

## 2017-11-10 DIAGNOSIS — E039 Hypothyroidism, unspecified: Secondary | ICD-10-CM | POA: Diagnosis not present

## 2017-11-10 DIAGNOSIS — Z79899 Other long term (current) drug therapy: Secondary | ICD-10-CM | POA: Diagnosis not present

## 2017-11-12 ENCOUNTER — Encounter (HOSPITAL_COMMUNITY)
Admission: RE | Admit: 2017-11-12 | Discharge: 2017-11-12 | Disposition: A | Discharge status: Home / Self Care | Payer: Medicare HMO | Source: Ambulatory Visit | Attending: Interventional Cardiology | Admitting: Interventional Cardiology

## 2017-11-12 DIAGNOSIS — Z7982 Long term (current) use of aspirin: Secondary | ICD-10-CM | POA: Insufficient documentation

## 2017-11-12 DIAGNOSIS — Z79899 Other long term (current) drug therapy: Secondary | ICD-10-CM | POA: Diagnosis not present

## 2017-11-12 DIAGNOSIS — D696 Thrombocytopenia, unspecified: Secondary | ICD-10-CM | POA: Diagnosis not present

## 2017-11-12 DIAGNOSIS — Z79890 Hormone replacement therapy (postmenopausal): Secondary | ICD-10-CM | POA: Diagnosis not present

## 2017-11-12 DIAGNOSIS — I10 Essential (primary) hypertension: Secondary | ICD-10-CM | POA: Insufficient documentation

## 2017-11-12 DIAGNOSIS — Z951 Presence of aortocoronary bypass graft: Secondary | ICD-10-CM | POA: Diagnosis not present

## 2017-11-12 DIAGNOSIS — Z7984 Long term (current) use of oral hypoglycemic drugs: Secondary | ICD-10-CM | POA: Insufficient documentation

## 2017-11-12 DIAGNOSIS — I2510 Atherosclerotic heart disease of native coronary artery without angina pectoris: Secondary | ICD-10-CM | POA: Diagnosis not present

## 2017-11-12 DIAGNOSIS — E039 Hypothyroidism, unspecified: Secondary | ICD-10-CM | POA: Insufficient documentation

## 2017-11-13 NOTE — Progress Notes (Signed)
Cardiac Individual Treatment Plan  Patient Details  Name: Wayne Mitchell MRN: [id number] Date of Birth: 17-Jan-1936 Referring Provider:   Flowsheet Row CARDIAC REHAB PHASE II ORIENTATION from 08/12/2017 in [place]  Referring Provider  Daneen Schick MD      Initial Encounter Date:  [place] from 08/12/2017 in [place]  Date  08/12/17      Visit Diagnosis: 06/02/17 S/P CABG x 3  Patient's Home Medications on Admission:  Current Outpatient Medications:  .  acetaminophen (TYLENOL) 325 MG tablet, Take 2 tablets (650 mg total) by mouth every 6 (six) hours as needed for mild pain., Disp: , Rfl:  .  aspirin EC 81 MG tablet, Take 81 mg by mouth See admin instructions. Twice monthly... Pt takes 1 pill on the 1st and 1 pill on the 15th, Disp: , Rfl:  .  atorvastatin (LIPITOR) 20 MG tablet, Take 20 mg by mouth daily., Disp: , Rfl:  .  carvedilol (COREG) 3.125 MG tablet, Take 1 tablet (3.125 mg total) by mouth daily., Disp: 90 tablet, Rfl: 3 .  levothyroxine (SYNTHROID, LEVOTHROID) 112 MCG tablet, Take 112 mcg by mouth daily before breakfast., Disp: , Rfl:   Past Medical History: Past Medical History:  Diagnosis Date  . Carotid artery disease ([place])   . Colon polyp    small  . Coronary artery disease   . Coronary artery disease involving native coronary artery of native heart with unstable angina pectoris ([place]) 05/30/2017   3V CABG 06/02/17  . HTN (hypertension)   . Hypothyroidism   . Syncope   . Syncope   . Thrombocytopenia ([place])     Tobacco Use: Social History   Tobacco Use  Smoking Status Never Smoker  Smokeless Tobacco Never Used    Labs: Recent Review Flowsheet Data    Labs for ITP Cardiac and Pulmonary Rehab Latest Ref Rng & Units 06/04/2017 06/05/2017 06/05/2017 06/05/2017 06/06/2017   Cholestrol 0 - 200 mg/dL - - - - -   LDLCALC 0 - 99 mg/dL - - - - -   HDL >40 mg/dL - -  - - -   Trlycerides <150 mg/dL - - - - -   Hemoglobin A1c 4.8 - 5.6 % - - - - -   PHART 7.350 - 7.450 - - 7.436 - -   PCO2ART 32.0 - 48.0 mmHg - - 34.8 - -   HCO3 20.0 - 28.0 mmol/L - - 23.5 - -   TCO2 22 - 32 mmol/L - - 25 26 -   ACIDBASEDEF 0.0 - 2.0 mmol/L - - 1.0 - -   O2SAT % 46.1 39.8 97.0 - 54.8      Capillary Blood Glucose: Lab Results  Component Value Date   GLUCAP 125 (H) 06/08/2017   GLUCAP 118 (H) 06/08/2017   GLUCAP 106 (H) 06/07/2017   GLUCAP 126 (H) 06/07/2017   GLUCAP 113 (H) 06/07/2017     Exercise Target Goals: Exercise Program Goal: Individual exercise prescription set using results from initial 6 min walk test and THRR while considering  patient's activity barriers and safety.   Exercise Prescription Goal: Initial exercise prescription builds to 30-45 minutes a day of aerobic activity, 2-3 days per week.  Home exercise guidelines will be given to patient during program as part of exercise prescription that the participant will acknowledge.  Activity Barriers & Risk Stratification: Activity Barriers & Cardiac Risk Stratification - 08/12/17  1055    Activity Barriers & Cardiac Risk Stratification          Activity Barriers  Deconditioning;Muscular Weakness    Cardiac Risk Stratification  High           6 Minute Walk: 6 Minute Walk    6 Minute Walk    Row Name 08/12/17 1054 10/22/17 0936   Phase  Initial  Discharge   Distance  1456 feet  1971 feet   Distance % Change  no documentation  35.37 %   Distance Feet Change  no documentation  515 ft   Walk Time  6 minutes  6 minutes   # of Rest Breaks  0  0   MPH  2.76  3.74   METS  3.09  4.18   RPE  10  11   Perceived Dyspnea   no documentation  0   VO2 Peak  10.83  14.61   Symptoms  No  No   Resting HR  99 bpm  84 bpm   Resting BP  112/62  120/80   Resting Oxygen Saturation   98 %  no documentation   Exercise Oxygen Saturation  during 6 min walk  98 %  no documentation   Max Ex. HR  115 bpm  117  bpm   Max Ex. BP  134/78  158/74   2 Minute Post BP  130/70  118/70          Oxygen Initial Assessment:   Oxygen Re-Evaluation:   Oxygen Discharge (Final Oxygen Re-Evaluation):   Initial Exercise Prescription: Initial Exercise Prescription - 08/12/17 1000    Date of Initial Exercise RX and Referring Provider          Date  08/12/17    Referring Provider  Daneen Schick MD    Expected Discharge Date  11/12/17        Bike          Level  0.5    Minutes  10    METs  2.4        NuStep          Level  2    SPM  70    Minutes  10    METs  2        Track          Laps  10    Minutes  10    METs  2.74        Prescription Details          Frequency (times per week)  3    Duration  Progress to 30 minutes of continuous aerobic without signs/symptoms of physical distress        Intensity          THRR 40-80% of Max Heartrate  56-113    Ratings of Perceived Exertion  11-13    Perceived Dyspnea  0-4        Progression          Progression  Continue to progress workloads to maintain intensity without signs/symptoms of physical distress.        Resistance Training          Training Prescription  Yes    Weight  2lbs    Reps  10-15           Perform Capillary Blood Glucose checks as needed.  Exercise Prescription Changes: Exercise Prescription Changes    Response to Exercise    Row  Name 08/20/17 1504 08/25/17 1612 09/01/17 1600 09/12/17 1200 09/24/17 1100   Blood Pressure (Admit)  144/78  110/58  no documentation  140/68  122/70   Blood Pressure (Exercise)  142/62  138/60  no documentation  164/84  160/80   Blood Pressure (Exit)  124/80  106/62  no documentation  124/70  118/80   Heart Rate (Admit)  95 bpm  89 bpm  no documentation  90 bpm  80 bpm   Heart Rate (Exercise)  111 bpm  114 bpm  no documentation  111 bpm  116 bpm   Heart Rate (Exit)  77 bpm  88 bpm  no documentation  87 bpm  80 bpm   Rating of Perceived Exertion (Exercise)  13  13  no  documentation  14  11   Symptoms  none  none  no documentation  none  none   Comments  pt oriented to exercise equipment  no documentation  no documentation  no documentation  no documentation   Duration  Continue with 30 min of aerobic exercise without signs/symptoms of physical distress.  Continue with 30 min of aerobic exercise without signs/symptoms of physical distress.  no documentation  Continue with 30 min of aerobic exercise without signs/symptoms of physical distress.  Continue with 30 min of aerobic exercise without signs/symptoms of physical distress.   Intensity  THRR unchanged  THRR unchanged  no documentation  THRR unchanged  THRR unchanged       Progression    Row Name 08/20/17 1504 08/25/17 1612 09/01/17 1600 09/12/17 1200 09/24/17 1100   Progression  Continue to progress workloads to maintain intensity without signs/symptoms of physical distress.  Continue to progress workloads to maintain intensity without signs/symptoms of physical distress.  no documentation  Continue to progress workloads to maintain intensity without signs/symptoms of physical distress.  Continue to progress workloads to maintain intensity without signs/symptoms of physical distress.   Average METs  2.5  2.6  no documentation  3.3  3.3       Resistance Training    Row Name 08/20/17 1504 08/25/17 1612 09/01/17 1600 09/12/17 1200 09/24/17 1100   Training Prescription  No relaxation day  Yes  no documentation  Yes  No relaxation day   Weight  no documentation  3lbs  no documentation  4lbs  no documentation   Reps  no documentation  10-15  no documentation  10-15  no documentation   Time  10 Minutes  10 Minutes  no documentation  10 Minutes  [place] Name 08/20/17 1504 08/25/17 1612 09/01/17 1600 09/12/17 1200 09/24/17 1100   Level  0.5  0.5  no documentation  0.7  0.7   Minutes  10  10  no documentation  10  10   METs  2.4  2.4  no documentation  2.95  2.99       NuStep    Row Name  08/20/17 1504 08/25/17 1612 09/01/17 1600 09/12/17 1200 09/24/17 1100   Level  2  2  no documentation  4  5   SPM  70  80  no documentation  80  80   Minutes  10  10  no documentation  10  10   METs  2  2.4  no documentation  3.8  3.9       Track    Row Name 08/20/17 1504 08/25/17 1612 09/01/17 1600 09/12/17 1200 09/24/17  1100   Laps  12  10  no documentation  13  12   Minutes  10  10  no documentation  10  10   METs  3.09  2.74  no documentation  3.26  3.09       Home Exercise Plan    Row Name 08/20/17 1504 08/25/17 1612 09/01/17 1600 09/12/17 1200 09/24/17 1100   Plans to continue exercise at  no documentation  no documentation  no documentation  Home (comment)  Home (comment)   Frequency  no documentation  no documentation  no documentation  Add 2 additional days to program exercise sessions.  Add 2 additional days to program exercise sessions.   Initial Home Exercises Provided  no documentation  no documentation  no documentation  09/03/17  09/03/17       Response to Exercise    Row Name 10/10/17 1440 10/22/17 1624 11/12/17 1339   Blood Pressure (Admit)  122/72  120/80  124/76   Blood Pressure (Exercise)  150/80  158/74  150/66   Blood Pressure (Exit)  118/60  118/80  108/70   Heart Rate (Admit)  81 bpm  84 bpm  81 bpm   Heart Rate (Exercise)  123 bpm  127 bpm  119 bpm   Heart Rate (Exit)  81 bpm  75 bpm  75 bpm   Rating of Perceived Exertion (Exercise)  _0 Perceived Dyspnea (Exercise)  0  0  0   Symptoms  None   None   None    Comments  no documentation  None  None   Duration  Continue with 30 min of aerobic exercise without signs/symptoms of physical distress.  Continue with 30 min of aerobic exercise without signs/symptoms of physical distress.  Continue with 30 min of aerobic exercise without signs/symptoms of physical distress.   Intensity  THRR unchanged  THRR unchanged  THRR unchanged       Progression    Row Name 10/10/17 1440 10/22/17 1624 11/12/17 1339    Progression  Continue to progress workloads to maintain intensity without signs/symptoms of physical distress.  Continue to progress workloads to maintain intensity without signs/symptoms of physical distress.  Continue to progress workloads to maintain intensity without signs/symptoms of physical distress.   Average METs  3.81  3.84  4.28       Resistance Training    Row Name 10/10/17 1440 10/22/17 1624 11/12/17 1339   Training Prescription  Yes relaxation day  no documentation  No   Weight  4lbs  no documentation  no documentation   Reps  10-15  no documentation  no documentation   Time  10 Minutes  no documentation  no documentation       Interval Training    Row Name 10/10/17 1440 10/22/17 1624 11/12/17 1339   Interval Training  no documentation  No  No       Bike    Row Name 10/10/17 1440 10/22/17 1624 11/12/17 1339   Level  0.7  0.7  1.2   Minutes  _1 METs  3  3  4.41       NuStep    Row Name 10/10/17 1440 10/22/17 1624 11/12/17 1339   Level  _2 SPM  85  85  95   Minutes  _3 METs  4.3  4.4  4.3  Track    Row Name 10/10/17 1440 10/22/17 1624 11/12/17 1339   Laps  _0 Minutes  _1 METs  3.3  3.3  4.13       Home Exercise Plan    Row Name 10/10/17 1440 10/22/17 1624 11/12/17 1339   Plans to continue exercise at  Home (comment) Walking  Home (comment) Walking  Home (comment) Walking   Frequency  Add 2 additional days to program exercise sessions.  Add 2 additional days to program exercise sessions.  Add 2 additional days to program exercise sessions.   Initial Home Exercises Provided  09/03/17  09/03/17  09/03/17          Exercise Comments: Exercise Comments    Row Name 08/21/17 1449 09/12/17 1212 10/06/17 1727 11/13/17 1342   Exercise Comments  Pt completed first session of cardiac rehab. Pt was able to exercise 30 minutes without difficulty. Rehab staff will monitor pt's activity levels and progress activities  as tolerated.   Reviewed METs and goals. Pt is tolerating exercise program very well. Rehab staff will continue to monitor pt's activity levels   Reviewed METs and goals. Pt is tolerating exercise program very well. Rehab staff will continue to monitor pt's activity levels   Reviewed METs and Goals with pt. Pt is continuing to respond well to exercise. Pt has returned to rehab from having eye surgery. Pt reported no issues with exercise. Will continue to monitor.      Exercise Goals and Review: Exercise Goals    Exercise Goals    Row Name 08/12/17 0935   Increase Physical Activity  Yes   Intervention  Provide advice, education, support and counseling about physical activity/exercise needs.;Develop an individualized exercise prescription for aerobic and resistive training based on initial evaluation findings, risk stratification, comorbidities and participant's personal goals.   Expected Outcomes  Short Term: Attend rehab on a regular basis to increase amount of physical activity.;Long Term: Add in home exercise to make exercise part of routine and to increase amount of physical activity.;Long Term: Exercising regularly at least 3-5 days a week.   Increase Strength and Stamina  Yes   Intervention  Provide advice, education, support and counseling about physical activity/exercise needs.;Develop an individualized exercise prescription for aerobic and resistive training based on initial evaluation findings, risk stratification, comorbidities and participant's personal goals.   Expected Outcomes  Short Term: Increase workloads from initial exercise prescription for resistance, speed, and METs.;Short Term: Perform resistance training exercises routinely during rehab and add in resistance training at home;Long Term: Improve cardiorespiratory fitness, muscular endurance and strength as measured by increased METs and functional capacity (6MWT)   Able to understand and use rate of perceived exertion (RPE) scale   Yes   Intervention  Provide education and explanation on how to use RPE scale   Expected Outcomes  Long Term:  Able to use RPE to guide intensity level when exercising independently;Short Term: Able to use RPE daily in rehab to express subjective intensity level   Able to understand and use Dyspnea scale  Yes   Intervention  Provide education and explanation on how to use Dyspnea scale   Expected Outcomes  Short Term: Able to use Dyspnea scale daily in rehab to express subjective sense of shortness of breath during exertion;Long Term: Able to use Dyspnea scale to guide intensity level when exercising independently   Knowledge and understanding of Target Heart Rate Range (THRR)  Yes   Intervention  Provide education and explanation of THRR including how the numbers were predicted and where they are located for reference   Expected Outcomes  Short Term: Able to state/look up THRR;Long Term: Able to use THRR to govern intensity when exercising independently;Short Term: Able to use daily as guideline for intensity in rehab   Able to check pulse independently  Yes   Intervention  Provide education and demonstration on how to check pulse in carotid and radial arteries.;Review the importance of being able to check your own pulse for safety during independent exercise   Expected Outcomes  Short Term: Able to explain why pulse checking is important during independent exercise;Long Term: Able to check pulse independently and accurately   Understanding of Exercise Prescription  Yes   Intervention  Provide education, explanation, and written materials on patient's individual exercise prescription   Expected Outcomes  Short Term: Able to explain program exercise prescription;Long Term: Able to explain home exercise prescription to exercise independently          Exercise Goals Re-Evaluation : Exercise Goals Re-Evaluation    Exercise Goal Re-Evaluation    Row Name 08/20/17 1447 09/03/17 1141 10/06/17 1725  11/13/17 1343   Exercise Goals Review  Increase Physical Activity;Increase Strength and Stamina  Increase Physical Activity;Able to understand and use rate of perceived exertion (RPE) scale;Knowledge and understanding of Target Heart Rate Range (THRR);Understanding of Exercise Prescription;Increase Strength and Stamina;Able to check pulse independently;Improve claudication pain tolerance and improve walking ability  Increase Physical Activity;Able to understand and use rate of perceived exertion (RPE) scale;Knowledge and understanding of Target Heart Rate Range (THRR);Understanding of Exercise Prescription;Increase Strength and Stamina;Able to check pulse independently;Improve claudication pain tolerance and improve walking ability  Increase Physical Activity;Understanding of Exercise Prescription   Comments  Pt was oriented to exercise equipment today. Pt was able to complete exercise session without sings/symptoms of physical distress.   Reviewed HEP in which pt plans to walk at home for 15-20 minutes and slowly progress to 30 minutes. Pt will walk 2x/week in addiiton to coming to cardiac rehab. Pt is also understanding of temperature/emergency precautions.  Pt has returned to dancing. Pt feels great and ready to mow the lawn. Discussed increasing MET level to a 5 before pushing a self-propel mower. Pt is currently averaging 3.5-4.0 METs. Will f/u  Pt is continuing to tolerate exercise will. Will follow up with pt regarding trying level 6 on Nustep. Pt is continuing to increase stamina. Will continue to monitor.    Expected Outcomes  Pt will be able to exercise 30 minutes without difficulty and improve in cardiorespiratory fitness  Pt will be able to exercise 30 minutes without difficulty and improve in cardiorespiratory fitness  Pt will continue to improve in MET levels and CR fitness to be able to mow the lawn with a self propel mower.   Pt will continue to increase cardiorespiratory fitness. Pt will be  graduating from rehab next week. Will follow up with pt regarding exercising post Cardiac Rehab.           Discharge Exercise Prescription (Final Exercise Prescription Changes): Exercise Prescription Changes - 11/12/17 1339    Response to Exercise          Blood Pressure (Admit)  124/76    Blood Pressure (Exercise)  150/66    Blood Pressure (Exit)  108/70    Heart Rate (Admit)  81 bpm    Heart Rate (Exercise)  119 bpm  Heart Rate (Exit)  75 bpm    Rating of Perceived Exertion (Exercise)  14    Perceived Dyspnea (Exercise)  0    Symptoms  None     Comments  None    Duration  Continue with 30 min of aerobic exercise without signs/symptoms of physical distress.    Intensity  THRR unchanged        Progression          Progression  Continue to progress workloads to maintain intensity without signs/symptoms of physical distress.    Average METs  4.28        Resistance Training          Training Prescription  No        Interval Training          Interval Training  No        Bike          Level  1.2    Minutes  10    METs  4.41        NuStep          Level  5    SPM  95    Minutes  10    METs  4.3        Track          Laps  14    Minutes  10    METs  4.13        Home Exercise Plan          Plans to continue exercise at  Home (comment)   Walking   Frequency  Add 2 additional days to program exercise sessions.    Initial Home Exercises Provided  09/03/17           Nutrition:  Target Goals: Understanding of nutrition guidelines, daily intake of sodium <1567m, cholesterol <2032m calories 30% from fat and 7% or less from saturated fats, daily to have 5 or more servings of fruits and vegetables.  Biometrics: Pre Biometrics - 08/12/17 1058    Pre Biometrics          Height  5' 7" (1.702 m)    Weight  67.6 kg    Waist Circumference  33 inches    Hip Circumference  38.25 inches    Waist to Hip Ratio  0.86 %    BMI (Calculated)  23.34     Triceps Skinfold  23 mm    % Body Fat  24.9 %    Grip Strength  29 kg    Flexibility  0 in    Single Leg Stand  6.35 seconds          Post Biometrics - 10/22/17 0937     Post  Biometrics          Height  5' 7" (1.702 m)    Weight  66.3 kg    Waist Circumference  34 inches    Hip Circumference  37 inches    Waist to Hip Ratio  0.92 %    BMI (Calculated)  22.89    Triceps Skinfold  10 mm    % Body Fat  21.8 %    Grip Strength  32 kg    Flexibility  0 in    Single Leg Stand  3.25 seconds           Nutrition Therapy Plan and Nutrition Goals: Nutrition Therapy & Goals - 08/12/17 1327    Nutrition Therapy  Diet  consistent carbohydrate heart healthy        Personal Nutrition Goals          Nutrition Goal  identify and limit food sources of saturated fat, trans fat, and sodium    Personal Goal #2  pt able to name foods that affect blood glucose        Intervention Plan          Intervention  Prescribe, educate and counsel regarding individualized specific dietary modifications aiming towards targeted core components such as weight, hypertension, lipid management, diabetes, heart failure and other comorbidities.    Expected Outcomes  Short Term Goal: Understand basic principles of dietary content, such as calories, fat, sodium, cholesterol and nutrients.           Nutrition Assessments: Nutrition Assessments - 08/12/17 1328    MEDFICTS Scores          Pre Score  25           Nutrition Goals Re-Evaluation:   Nutrition Goals Re-Evaluation:   Nutrition Goals Discharge (Final Nutrition Goals Re-Evaluation):   Psychosocial: Target Goals: Acknowledge presence or absence of significant depression and/or stress, maximize coping skills, provide positive support system. Participant is able to verbalize types and ability to use techniques and skills needed for reducing stress and depression.  Initial Review & Psychosocial Screening: Initial Psych Review &  Screening - 08/12/17 1006    Initial Review          Current issues with  None Identified        Family Dynamics          Good Support System?  Yes   spouse       Barriers          Psychosocial barriers to participate in program  There are no identifiable barriers or psychosocial needs.        Screening Interventions          Interventions  Encouraged to exercise           Quality of Life Scores: Quality of Life - 08/20/17 1227    Quality of Life          Select  Quality of Life        Quality of Life Scores          Health/Function Pre  26.2 %    Socioeconomic Pre  30 %    Psych/Spiritual Pre  29.14 %    GLOBAL Pre  28.21 %   scores reviewed with pt. no concerns identified. pt encouraged to participate in CR exercise for overall well being.          Scores of 19 and below usually indicate a poorer quality of life in these areas.  A difference of  2-3 points is a clinically meaningful difference.  A difference of 2-3 points in the total score of the Quality of Life Index has been associated with significant improvement in overall quality of life, self-image, physical symptoms, and general health in studies assessing change in quality of life.  PHQ-9: Recent Review Flowsheet Data    Depression screen [facility] 2/9 08/20/2017   Decreased Interest 0   Down, Depressed, Hopeless 0   PHQ - 2 Score 0     Interpretation of Total Score  Total Score Depression Severity:  1-4 = Minimal depression, 5-9 = Mild depression, 10-14 = Moderate depression, 15-19 = Moderately severe depression, 20-27 = Severe depression   Psychosocial Evaluation and  Intervention: Psychosocial Evaluation - 10/15/17 1712    Psychosocial Evaluation & Interventions          Interventions  Encouraged to exercise with the program and follow exercise prescription    Comments  no psychosocial needs identified, no interventions necessary.     Expected Outcomes  pt will exhibit positive outlook with good  coping skills.     Continue Psychosocial Services   No Follow up required           Psychosocial Re-Evaluation: Psychosocial Re-Evaluation    Psychosocial Re-Evaluation    Row Name 08/21/17 [phone] 09/16/17 1135 10/16/17 1627 11/13/17 1522   Current issues with  None Identified  None Identified  None Identified  None Identified   Comments  no psychosocial needs identified, no interventions necessary   no psychosocial needs identified, no interventions necessary   no psychosocial needs identified, no interventions necessary   no psychosocial needs identified, no interventions necessary    Expected Outcomes  pt will exhibit positive outlook with good coping skills.   pt will exhibit positive outlook with good coping skills.   pt will exhibit positive outlook with good coping skills.   pt will exhibit positive outlook with good coping skills.    Interventions  Encouraged to attend Cardiac Rehabilitation for the exercise  Encouraged to attend Cardiac Rehabilitation for the exercise  Encouraged to attend Cardiac Rehabilitation for the exercise  Encouraged to attend Cardiac Rehabilitation for the exercise   Continue Psychosocial Services   No Follow up required  No Follow up required  No Follow up required  No Follow up required          Psychosocial Discharge (Final Psychosocial Re-Evaluation): Psychosocial Re-Evaluation - 11/13/17 1522    Psychosocial Re-Evaluation          Current issues with  None Identified    Comments  no psychosocial needs identified, no interventions necessary     Expected Outcomes  pt will exhibit positive outlook with good coping skills.     Interventions  Encouraged to attend Cardiac Rehabilitation for the exercise    Continue Psychosocial Services   No Follow up required           Vocational Rehabilitation: Provide vocational rehab assistance to qualifying candidates.   Vocational Rehab Evaluation & Intervention: Vocational Rehab - 08/12/17 1006    Initial  Vocational Rehab Evaluation & Intervention          Assessment shows need for Vocational Rehabilitation  No   retired           Education: Education Goals: Education classes will be provided on a weekly basis, covering required topics. Participant will state understanding/return demonstration of topics presented.  Learning Barriers/Preferences: Learning Barriers/Preferences - 08/12/17 0935    Learning Barriers/Preferences          Learning Barriers  Sight    Learning Preferences  Written Material           Education Topics: Count Your Pulse:  -Group instruction provided by verbal instruction, demonstration, patient participation and written materials to support subject.  Instructors address importance of being able to find your pulse and how to count your pulse when at home without a heart monitor.  Patients get hands on experience counting their pulse with staff help and individually. Flowsheet Row CARDIAC REHAB PHASE II EXERCISE from 11/12/2017 in [place]  Date  09/19/17  Educator  RN  Instruction Review Code  2- Demonstrated Understanding  Heart Attack, Angina, and Risk Factor Modification:  -Group instruction provided by verbal instruction, video, and written materials to support subject.  Instructors address signs and symptoms of angina and heart attacks.    Also discuss risk factors for heart disease and how to make changes to improve heart health risk factors. Flowsheet Row CARDIAC REHAB PHASE II EXERCISE from 11/12/2017 in [place]  Date  11/12/17  Educator  RN  Instruction Review Code  2- Demonstrated Understanding      Functional Fitness:  -Group instruction provided by verbal instruction, demonstration, patient participation, and written materials to support subject.  Instructors address safety measures for doing things around the house.  Discuss how to get up and down off the floor, how to  pick things up properly, how to safely get out of a chair without assistance, and balance training. Flowsheet Row CARDIAC REHAB PHASE II EXERCISE from 11/12/2017 in [place]  Date  09/26/17  Instruction Review Code  2- Demonstrated Understanding      Meditation and Mindfulness:  -Group instruction provided by verbal instruction, patient participation, and written materials to support subject.  Instructor addresses importance of mindfulness and meditation practice to help reduce stress and improve awareness.  Instructor also leads participants through a meditation exercise.  Flowsheet Row CARDIAC REHAB PHASE II EXERCISE from 11/12/2017 in [place]  Date  10/08/17  Educator  Jeanella Craze  Instruction Review Code  2- Demonstrated Understanding      Stretching for Flexibility and Mobility:  -Group instruction provided by verbal instruction, patient participation, and written materials to support subject.  Instructors lead participants through series of stretches that are designed to increase flexibility thus improving mobility.  These stretches are additional exercise for major muscle groups that are typically performed during regular warm up and cool down.   Hands Only CPR:  -Group verbal, video, and participation provides a basic overview of AHA guidelines for community CPR. Role-play of emergencies allow participants the opportunity to practice calling for help and chest compression technique with discussion of AED use.   Hypertension: -Group verbal and written instruction that provides a basic overview of hypertension including the most recent diagnostic guidelines, risk factor reduction with self-care instructions and medication management. Flowsheet Row CARDIAC REHAB PHASE II EXERCISE from 11/12/2017 in [place]  Date  08/29/17  Educator  RN  Instruction Review Code  2- Demonstrated  Understanding       Nutrition I class: Heart Healthy Eating:  -Group instruction provided by PowerPoint slides, verbal discussion, and written materials to support subject matter. The instructor gives an explanation and review of the Therapeutic Lifestyle Changes diet recommendations, which includes a discussion on lipid goals, dietary fat, sodium, fiber, plant stanol/sterol esters, sugar, and the components of a well-balanced, healthy diet.   Nutrition II class: Lifestyle Skills:  -Group instruction provided by PowerPoint slides, verbal discussion, and written materials to support subject matter. The instructor gives an explanation and review of label reading, grocery shopping for heart health, heart healthy recipe modifications, and ways to make healthier choices when eating out.   Diabetes Question & Answer:  -Group instruction provided by PowerPoint slides, verbal discussion, and written materials to support subject matter. The instructor gives an explanation and review of diabetes co-morbidities, pre- and post-prandial blood glucose goals, pre-exercise blood glucose goals, signs, symptoms, and treatment of hypoglycemia and hyperglycemia, and foot care basics.  Diabetes Blitz:  -Group instruction provided by PowerPoint slides, verbal discussion, and written materials to support subject matter. The instructor gives an explanation and review of the physiology behind type 1 and type 2 diabetes, diabetes medications and rational behind using different medications, pre- and post-prandial blood glucose recommendations and Hemoglobin A1c goals, diabetes diet, and exercise including blood glucose guidelines for exercising safely.    Portion Distortion:  -Group instruction provided by PowerPoint slides, verbal discussion, written materials, and food models to support subject matter. The instructor gives an explanation of serving size versus portion size, changes in portions sizes over the last 20  years, and what consists of a serving from each food group.   Stress Management:  -Group instruction provided by verbal instruction, video, and written materials to support subject matter.  Instructors review role of stress in heart disease and how to cope with stress positively.   Flowsheet Row CARDIAC REHAB PHASE II EXERCISE from 11/12/2017 in [place]  Date  09/03/17  Educator  RN  Instruction Review Code  2- Demonstrated Understanding      Exercising on Your Own:  -Group instruction provided by verbal instruction, power point, and written materials to support subject.  Instructors discuss benefits of exercise, components of exercise, frequency and intensity of exercise, and end points for exercise.  Also discuss use of nitroglycerin and activating EMS.  Review options of places to exercise outside of rehab.  Review guidelines for sex with heart disease. Flowsheet Row CARDIAC REHAB PHASE II EXERCISE from 11/12/2017 in [place]  Date  10/01/17  Educator  EP  Instruction Review Code  2- Demonstrated Understanding      Cardiac Drugs I:  -Group instruction provided by verbal instruction and written materials to support subject.  Instructor reviews cardiac drug classes: antiplatelets, anticoagulants, beta blockers, and statins.  Instructor discusses reasons, side effects, and lifestyle considerations for each drug class. Flowsheet Row CARDIAC REHAB PHASE II EXERCISE from 11/12/2017 in [place]  Date  10/22/17  Instruction Review Code  2- Demonstrated Understanding      Cardiac Drugs II:  -Group instruction provided by verbal instruction and written materials to support subject.  Instructor reviews cardiac drug classes: angiotensin converting enzyme inhibitors (ACE-I), angiotensin II receptor blockers (ARBs), nitrates, and calcium channel blockers.  Instructor discusses reasons, side effects,  and lifestyle considerations for each drug class. Flowsheet Row CARDIAC REHAB PHASE II EXERCISE from 11/12/2017 in [place]  Date  09/24/17  Educator  pharmacist  Instruction Review Code  2- Demonstrated Understanding      Anatomy and Physiology of the Circulatory System:  Group verbal and written instruction and models provide basic cardiac anatomy and physiology, with the coronary electrical and arterial systems. Review of: AMI, Angina, Valve disease, Heart Failure, Peripheral Artery Disease, Cardiac Arrhythmia, Pacemakers, and the ICD. Flowsheet Row CARDIAC REHAB PHASE II EXERCISE from 11/12/2017 in [place]  Date  09/10/17  Educator  RN  Instruction Review Code  2- Demonstrated Understanding      Other Education:  -Group or individual verbal, written, or video instructions that support the educational goals of the cardiac rehab program.   Holiday Eating Survival Tips:  -Group instruction provided by PowerPoint slides, verbal discussion, and written materials to support subject matter. The instructor gives patients tips, tricks, and techniques to help them not only survive but enjoy the holidays despite  the onslaught of food that accompanies the holidays.   Knowledge Questionnaire Score: Knowledge Questionnaire Score - 08/12/17 1006    Knowledge Questionnaire Score          Pre Score  20/24           Core Components/Risk Factors/Patient Goals at Admission: Personal Goals and Risk Factors at Admission - 08/12/17 0936    Core Components/Risk Factors/Patient Goals on Admission           Weight Management  Yes;Weight Maintenance    Intervention  Weight Management: Develop a combined nutrition and exercise program designed to reach desired caloric intake, while maintaining appropriate intake of nutrient and fiber, sodium and fats, and appropriate energy expenditure required for the weight goal.;Weight Management:  Provide education and appropriate resources to help participant work on and attain dietary goals.    Admit Weight  149 lb 0.5 oz (67.6 kg)    Goal Weight: Short Term  149 lb (67.6 kg)    Goal Weight: Long Term  149 lb (67.6 kg)    Expected Outcomes  Short Term: Continue to assess and modify interventions until short term weight is achieved;Long Term: Adherence to nutrition and physical activity/exercise program aimed toward attainment of established weight goal;Weight Maintenance: Understanding of the daily nutrition guidelines, which includes 25-35% calories from fat, 7% or less cal from saturated fats, less than 289m cholesterol, less than 1.5gm of sodium, & 5 or more servings of fruits and vegetables daily;Understanding recommendations for meals to include 15-35% energy as protein, 25-35% energy from fat, 35-60% energy from carbohydrates, less than 2085mof dietary cholesterol, 20-35 gm of total fiber daily;Understanding of distribution of calorie intake throughout the day with the consumption of 4-5 meals/snacks    Hypertension  Yes    Intervention  Provide education on lifestyle modifcations including regular physical activity/exercise, weight management, moderate sodium restriction and increased consumption of fresh fruit, vegetables, and low fat dairy, alcohol moderation, and smoking cessation.;Monitor prescription use compliance.    Expected Outcomes  Short Term: Continued assessment and intervention until BP is < 140/9059mG in hypertensive participants. < 130/7m47m in hypertensive participants with diabetes, heart failure or chronic kidney disease.;Long Term: Maintenance of blood pressure at goal levels.    Lipids  Yes    Intervention  Provide education and support for participant on nutrition & aerobic/resistive exercise along with prescribed medications to achieve LDL <70mg77mL >40mg.31mExpected Outcomes  Short Term: Participant states understanding of desired cholesterol values and is  compliant with medications prescribed. Participant is following exercise prescription and nutrition guidelines.;Long Term: Cholesterol controlled with medications as prescribed, with individualized exercise RX and with personalized nutrition plan. Value goals: LDL < 70mg, 38m> 40 mg.           Core Components/Risk Factors/Patient Goals Review:  Goals and Risk Factor Review    Core Components/Risk Factors/Patient Goals Review    Row Name 08/20/17 1217 09/16/17 1134 10/15/17 1712 11/13/17 1522   Personal Goals Review  Weight Management/Obesity;Hypertension;Lipids  Weight Management/Obesity;Hypertension;Lipids  Weight Management/Obesity;Hypertension;Lipids  Weight Management/Obesity;Hypertension;Lipids   Review  pt with CAD RF demonstrates willingness to participate in CR program. pt personal goal to resume pleasure activities including yardwork and playing golf. pt also eager to finish healing process and restore health. pt encouraged to participate in CR exercise, nutrition and lifestyle modification opporunities.   no documentation  pt with CAD RF demonstrates willingness to participate in CR program. pt personal goal to resume  pleasure activities including yardwork and playing golf. pt also eager to finish healing process and restore health. pt stamina improved, MET level increased.    pt with CAD RF demonstrates willingness to participate in CR program. pt personal goal to resume pleasure activities including yardwork and playing golf. pt also eager to finish healing process and restore health. pt stamina improved, MET level increased.     Expected Outcomes  pt will participate in CR exercise, nutrition and lifestyle modification to decrease overall RF.   no documentation  pt will participate in CR exercise, nutrition and lifestyle modification to decrease overall RF.   pt will participate in CR exercise, nutrition and lifestyle modification to decrease overall RF.           Core Components/Risk  Factors/Patient Goals at Discharge (Final Review):  Goals and Risk Factor Review - 11/13/17 1522    Core Components/Risk Factors/Patient Goals Review          Personal Goals Review  Weight Management/Obesity;Hypertension;Lipids    Review  pt with CAD RF demonstrates willingness to participate in CR program. pt personal goal to resume pleasure activities including yardwork and playing golf. pt also eager to finish healing process and restore health. pt stamina improved, MET level increased.      Expected Outcomes  pt will participate in CR exercise, nutrition and lifestyle modification to decrease overall RF.            ITP Comments: ITP Comments    Row Name 08/12/17 0934 08/20/17 1215 09/16/17 1133 10/15/17 1712 11/13/17 1521   ITP Comments  Dr. Fransico Him, Medical Director  pt started group exercise program. pt tolerated light activity without difficulty. pt oriented to exercise equipment and safety routine.    30 day ITP review. pt with good attendance and participation. pt demonstrates willingness to participate in CR activiites.   30 day ITP review. pt with good attendance and participation. pt demonstrates willingness to participate in CR activiites.   30 day ITP review. pt with good attendance and participation. pt demonstrates willingness to participate in CR activiites.       Comments:

## 2017-11-14 ENCOUNTER — Encounter (HOSPITAL_COMMUNITY)
Admission: RE | Admit: 2017-11-14 | Discharge: 2017-11-14 | Disposition: A | Discharge status: Home / Self Care | Payer: Medicare HMO | Source: Ambulatory Visit | Attending: Interventional Cardiology | Admitting: Interventional Cardiology

## 2017-11-14 DIAGNOSIS — Z7984 Long term (current) use of oral hypoglycemic drugs: Secondary | ICD-10-CM | POA: Diagnosis not present

## 2017-11-14 DIAGNOSIS — Z79899 Other long term (current) drug therapy: Secondary | ICD-10-CM | POA: Diagnosis not present

## 2017-11-14 DIAGNOSIS — Z7982 Long term (current) use of aspirin: Secondary | ICD-10-CM | POA: Diagnosis not present

## 2017-11-14 DIAGNOSIS — I10 Essential (primary) hypertension: Secondary | ICD-10-CM | POA: Diagnosis not present

## 2017-11-14 DIAGNOSIS — I2510 Atherosclerotic heart disease of native coronary artery without angina pectoris: Secondary | ICD-10-CM | POA: Diagnosis not present

## 2017-11-14 DIAGNOSIS — D696 Thrombocytopenia, unspecified: Secondary | ICD-10-CM | POA: Diagnosis not present

## 2017-11-14 DIAGNOSIS — Z79890 Hormone replacement therapy (postmenopausal): Secondary | ICD-10-CM | POA: Diagnosis not present

## 2017-11-14 DIAGNOSIS — E039 Hypothyroidism, unspecified: Secondary | ICD-10-CM | POA: Diagnosis not present

## 2017-11-14 DIAGNOSIS — Z951 Presence of aortocoronary bypass graft: Secondary | ICD-10-CM

## 2017-11-17 ENCOUNTER — Encounter (HOSPITAL_COMMUNITY)
Admission: RE | Admit: 2017-11-17 | Discharge: 2017-11-17 | Disposition: A | Discharge status: Home / Self Care | Payer: Medicare HMO | Source: Ambulatory Visit | Attending: Interventional Cardiology | Admitting: Interventional Cardiology

## 2017-11-17 DIAGNOSIS — I2510 Atherosclerotic heart disease of native coronary artery without angina pectoris: Secondary | ICD-10-CM | POA: Diagnosis not present

## 2017-11-17 DIAGNOSIS — E039 Hypothyroidism, unspecified: Secondary | ICD-10-CM | POA: Diagnosis not present

## 2017-11-17 DIAGNOSIS — I10 Essential (primary) hypertension: Secondary | ICD-10-CM | POA: Diagnosis not present

## 2017-11-17 DIAGNOSIS — Z7984 Long term (current) use of oral hypoglycemic drugs: Secondary | ICD-10-CM | POA: Diagnosis not present

## 2017-11-17 DIAGNOSIS — D696 Thrombocytopenia, unspecified: Secondary | ICD-10-CM | POA: Diagnosis not present

## 2017-11-17 DIAGNOSIS — Z79890 Hormone replacement therapy (postmenopausal): Secondary | ICD-10-CM | POA: Diagnosis not present

## 2017-11-17 DIAGNOSIS — Z951 Presence of aortocoronary bypass graft: Secondary | ICD-10-CM | POA: Diagnosis not present

## 2017-11-17 DIAGNOSIS — Z79899 Other long term (current) drug therapy: Secondary | ICD-10-CM | POA: Diagnosis not present

## 2017-11-17 DIAGNOSIS — Z7982 Long term (current) use of aspirin: Secondary | ICD-10-CM | POA: Diagnosis not present

## 2017-11-19 ENCOUNTER — Encounter (HOSPITAL_COMMUNITY)
Admission: RE | Admit: 2017-11-19 | Discharge: 2017-11-19 | Disposition: A | Discharge status: Home / Self Care | Payer: Medicare HMO | Source: Ambulatory Visit | Attending: Interventional Cardiology | Admitting: Interventional Cardiology

## 2017-11-19 ENCOUNTER — Encounter (HOSPITAL_COMMUNITY): Payer: Self-pay

## 2017-11-19 DIAGNOSIS — Z79899 Other long term (current) drug therapy: Secondary | ICD-10-CM | POA: Diagnosis not present

## 2017-11-19 DIAGNOSIS — D696 Thrombocytopenia, unspecified: Secondary | ICD-10-CM | POA: Diagnosis not present

## 2017-11-19 DIAGNOSIS — E039 Hypothyroidism, unspecified: Secondary | ICD-10-CM | POA: Diagnosis not present

## 2017-11-19 DIAGNOSIS — Z7982 Long term (current) use of aspirin: Secondary | ICD-10-CM | POA: Diagnosis not present

## 2017-11-19 DIAGNOSIS — Z79890 Hormone replacement therapy (postmenopausal): Secondary | ICD-10-CM | POA: Diagnosis not present

## 2017-11-19 DIAGNOSIS — Z7984 Long term (current) use of oral hypoglycemic drugs: Secondary | ICD-10-CM | POA: Diagnosis not present

## 2017-11-19 DIAGNOSIS — I2510 Atherosclerotic heart disease of native coronary artery without angina pectoris: Secondary | ICD-10-CM | POA: Diagnosis not present

## 2017-11-19 DIAGNOSIS — Z951 Presence of aortocoronary bypass graft: Secondary | ICD-10-CM

## 2017-11-19 DIAGNOSIS — I10 Essential (primary) hypertension: Secondary | ICD-10-CM | POA: Diagnosis not present

## 2017-11-19 NOTE — Progress Notes (Signed)
Cardiology Office Note:    Date:  11/20/2017   ID:  Wayne Mitchell, DOB [birth year], MRN [id number]  PCP:  Georgann Housekeeper, MD  Cardiologist:  Lesleigh Noe, MD   Referring MD: Georgann Housekeeper, MD   Chief Complaint  Patient presents with  . Coronary Artery Disease    History of Present Illness:    Wayne Mitchell is a 82 y.o. male with a hx of syncope related to dehydration,hyperlipidemia,  hypertension, and CAD with CABG 05/2017.  [place] is doing well.  He is completed cardiac rehab.  He denies angina.  He occasionally has a momentary feeling of lightheadedness.  He denies palpitations.  No medication side effects.  Recent laboratory data including hemoglobin A1c and LDL were all below target values.  He is taking carvedilol 3.125 mg daily.  Past Medical History:  Diagnosis Date  . Carotid artery disease (HCC)   . Colon polyp    small  . Coronary artery disease   . Coronary artery disease involving native coronary artery of native heart with unstable angina pectoris (HCC) 05/30/2017   3V CABG 06/02/17  . HTN (hypertension)   . Hypothyroidism   . Syncope   . Syncope   . Thrombocytopenia (HCC)     Past Surgical History:  Procedure Laterality Date  . CATARACT EXTRACTION Left   . CORONARY ARTERY BYPASS GRAFT N/A 06/02/2017   Procedure: CORONARY ARTERY BYPASS GRAFTING (CABG) x3 Using Left Internal Mammary Artery and Right Leg Greater Saphenous Vein harvested endoscopically;  Surgeon: Kerin Perna, MD;  Location: [facility] OR;  Service: Open Heart Surgery;  Laterality: N/A;  . ESOPHAGOGASTRODUODENOSCOPY N/A 10/23/2014   Procedure: ESOPHAGOGASTRODUODENOSCOPY (EGD);  Surgeon: Bernette Redbird, MD;  Location: [facility] ENDOSCOPY;  Service: Endoscopy;  Laterality: N/A;  . GAS/FLUID EXCHANGE Right 10/24/2017   Procedure: GAS/FLUID EXCHANGE;  Surgeon: Carmela Rima, MD;  Location: [facility] OR;  Service: Ophthalmology;  Laterality: Right;  SF6  . HEMORRHOID SURGERY    . LEFT HEART CATH AND  CORONARY ANGIOGRAPHY N/A 05/30/2017   Procedure: LEFT HEART CATH AND CORONARY ANGIOGRAPHY;  Surgeon: [name], MD;  Location: [facility] INVASIVE CV LAB;  Service: Cardiovascular;  Laterality: N/A;  . PARS PLANA VITRECTOMY Right 10/24/2017   Procedure: PARS PLANA VITRECTOMY WITH 25 GAUGE;  Surgeon: Carmela Rima, MD;  Location: [facility] OR;  Service: Ophthalmology;  Laterality: Right;  . PHOTOCOAGULATION Right 10/24/2017   Procedure: PHOTOCOAGULATION;  Surgeon: Carmela Rima, MD;  Location: [facility] OR;  Service: Ophthalmology;  Laterality: Right;  . REFRACTIVE SURGERY Right   . TEE WITHOUT CARDIOVERSION N/A 06/02/2017   Procedure: TRANSESOPHAGEAL ECHOCARDIOGRAM (TEE);  Surgeon: Donata Clay, Theron Arista, MD;  Location: [facility] OR;  Service: Open Heart Surgery;  Laterality: N/A;    Current Medications: Current Meds  Medication Sig  . acetaminophen (TYLENOL) 325 MG tablet Take 2 tablets (650 mg total) by mouth every 6 (six) hours as needed for mild pain.  Marland Kitchen amLODipine (NORVASC) 5 MG tablet Take 5 mg by mouth daily.  Marland Kitchen aspirin EC 81 MG tablet Take 81 mg by mouth See admin instructions. Twice monthly... Pt takes 1 pill on the 1st and 1 pill on the 15th  . atorvastatin (LIPITOR) 20 MG tablet Take 20 mg by mouth daily.  . carvedilol (COREG) 3.125 MG tablet Take 1 tablet (3.125 mg total) by mouth 2 (two) times daily with a meal.  . levothyroxine (SYNTHROID, LEVOTHROID) 112 MCG tablet Take 112 mcg by mouth daily before breakfast.  . [DISCONTINUED] carvedilol (COREG)  3.125 MG tablet Take 1 tablet (3.125 mg total) by mouth daily.     Allergies:   Lisinopril   Social History   Socioeconomic History  . Marital status: Married    Spouse name: Not on file  . Number of children: Not on file  . Years of education: Not on file  . Highest education level: Not on file  Occupational History  . Occupation: retired  Engineer, production  . Financial resource strain: Not on file  . Food insecurity:    Worry: Not on file    Inability:  Not on file  . Transportation needs:    Medical: Not on file    Non-medical: Not on file  Tobacco Use  . Smoking status: Never Smoker  . Smokeless tobacco: Never Used  Substance and Sexual Activity  . Alcohol use: Yes    Alcohol/week: 4.0 standard drinks    Types: 4 Cans of beer per week  . Drug use: No  . Sexual activity: Not on file  Lifestyle  . Physical activity:    Days per week: Not on file    Minutes per session: Not on file  . Stress: Not on file  Relationships  . Social connections:    Talks on phone: Not on file    Gets together: Not on file    Attends religious service: Not on file    Active member of club or organization: Not on file    Attends meetings of clubs or organizations: Not on file    Relationship status: Not on file  Other Topics Concern  . Not on file  Social History Narrative  . Not on file     Family History: The patient's family history includes Healthy in his brother; Pancreatic cancer in his mother.  ROS:   Please see the history of present illness.    Decreased memory but otherwise normal.  All other systems reviewed and are negative.  EKGs/Labs/Other Studies Reviewed:    The following studies were reviewed today: None  EKG:  EKG is not ordered today.    Recent Labs: 06/04/2017: TSH 5.209 06/07/2017: ALT 22 06/09/2017: Magnesium 1.7 10/24/2017: BUN 16; Creatinine, Ser 1.10; Hemoglobin 13.2; Platelets 86; Potassium 4.0; Sodium 139  Recent Lipid Panel    Component Value Date/Time   CHOL 130 05/31/2017 0517   TRIG 63 05/31/2017 0517   HDL 56 05/31/2017 0517   CHOLHDL 2.3 05/31/2017 0517   VLDL 13 05/31/2017 0517   LDLCALC 61 05/31/2017 0517    Physical Exam:    VS:  BP (!) 118/52   Pulse 64   Ht 5\' 8"  (1.727 m)   Wt 148 lb (67.1 kg)   BMI 22.50 kg/m     Wt Readings from Last 3 Encounters:  11/20/17 148 lb (67.1 kg)  10/22/17 146 lb 2.6 oz (66.3 kg)  08/18/17 148 lb 6.4 oz (67.3 kg)     GEN: Elderly but well nourished,  well developed in no acute distress HEENT: Normal NECK: No JVD. LYMPHATICS: No lymphadenopathy CARDIAC: RRR, no murmur, no gallop, no edema. VASCULAR: 2+ bilateral radial pulses.  No bruits. RESPIRATORY:  Clear to auscultation without rales, wheezing or rhonchi  ABDOMEN: Soft, non-tender, non-distended, No pulsatile mass, MUSCULOSKELETAL: No deformity  SKIN: Warm and dry NEUROLOGIC:  Alert and oriented x 3 PSYCHIATRIC:  Normal affect   ASSESSMENT:    1. Coronary artery disease involving coronary bypass graft of native heart with angina pectoris (HCC)   2. PVC's (premature  ventricular contractions)   3. Essential (primary) hypertension   4. Hyperlipidemia, unspecified hyperlipidemia type   5. Essential hypertension   6. Bilateral carotid artery occlusion    PLAN:    In order of problems listed above:  1. No angina or ischemic symptoms.  We had a long discussion concerning risk factor modification. 2. Asymptomatic and not apparent today. 3. Increase carvedilol to 3.125 mg p.o. twice daily.  Systolic pressures dipped below 100 mmHg, will discontinue amlodipine. 4. LDL is at target. 5. Will need longitudinal follow-up.  Secondary risk prevention discussed in detail: Glycemic control, blood pressure control, lipid control, moderate aerobic activity greater than 150 minutes/week.  Clinical follow-up in 6 months.  Prolonged office visit with greater than 50% of the time spent in counseling concerning secondary risk modification.   Medication Adjustments/Labs and Tests Ordered: Current medicines are reviewed at length with the patient today.  Concerns regarding medicines are outlined above.  No orders of the defined types were placed in this encounter.  Meds ordered this encounter  Medications  . carvedilol (COREG) 3.125 MG tablet    Sig: Take 1 tablet (3.125 mg total) by mouth 2 (two) times daily with a meal.    Dispense:  180 tablet    Refill:  3    Dose change    There  are no Patient Instructions on file for this visit.   Signed, Lesleigh Noe, MD  11/20/2017 8:22 AM    [facility]

## 2017-11-20 ENCOUNTER — Ambulatory Visit: Payer: Medicare HMO | Admitting: Interventional Cardiology

## 2017-11-20 ENCOUNTER — Encounter: Payer: Self-pay | Admitting: Interventional Cardiology

## 2017-11-20 VITALS — BP 118/52 | HR 64 | Ht 68.0 in | Wt 148.0 lb

## 2017-11-20 DIAGNOSIS — E785 Hyperlipidemia, unspecified: Secondary | ICD-10-CM | POA: Diagnosis not present

## 2017-11-20 DIAGNOSIS — I25709 Atherosclerosis of coronary artery bypass graft(s), unspecified, with unspecified angina pectoris: Secondary | ICD-10-CM | POA: Diagnosis not present

## 2017-11-20 DIAGNOSIS — I10 Essential (primary) hypertension: Secondary | ICD-10-CM

## 2017-11-20 DIAGNOSIS — I493 Ventricular premature depolarization: Secondary | ICD-10-CM

## 2017-11-20 DIAGNOSIS — I6523 Occlusion and stenosis of bilateral carotid arteries: Secondary | ICD-10-CM | POA: Diagnosis not present

## 2017-11-20 MED ORDER — CARVEDILOL 3.125 MG PO TABS: 3.1250 mg | ORAL_TABLET | Freq: Two times a day (BID) | ORAL | 3 refills | Status: DC

## 2017-11-20 NOTE — Patient Instructions (Signed)
Medication Instructions:  1) INCREASE Carvedilol to 3.125mg  twice daily  If you need a refill on your cardiac medications before your next appointment, please call your pharmacy.   Lab work: None If you have labs (blood work) drawn today and your tests are completely normal, you will receive your results only by: Marland Kitchen MyChart Message (if you have MyChart) OR . A paper copy in the mail If you have any lab test that is abnormal or we need to change your treatment, we will call you to review the results.  Testing/Procedures: None  Follow-Up: At [facility], you and your health needs are our priority.  As part of our continuing mission to provide you with exceptional heart care, we have created designated Provider Care Teams.  These Care Teams include your primary Cardiologist (physician) and Advanced Practice Providers (APPs -  Physician Assistants and Nurse Practitioners) who all work together to provide you with the care you need, when you need it. You will need a follow up appointment in 6-8 months.  Please call our office 2 months in advance to schedule this appointment.  You may see Wayne Noe, MD or one of the following Advanced Practice Providers on your designated Care Team:   Wayne Fredrickson, NP Wayne Boozer, NP . Wayne Chard, NP  Any Other Special Instructions Will Be Listed Below (If Applicable).

## 2017-11-21 ENCOUNTER — Telehealth: Payer: Self-pay | Admitting: Interventional Cardiology

## 2017-11-21 ENCOUNTER — Encounter (HOSPITAL_COMMUNITY): Payer: Medicare HMO

## 2017-11-21 ENCOUNTER — Other Ambulatory Visit: Payer: Self-pay | Admitting: *Deleted

## 2017-11-21 MED ORDER — CARVEDILOL 3.125 MG PO TABS: 3.1250 mg | ORAL_TABLET | Freq: Two times a day (BID) | ORAL | 3 refills | Status: DC

## 2017-11-21 NOTE — Telephone Encounter (Signed)
°*  STAT* If patient is at the pharmacy, call can be transferred to refill team.   1. Which medications need to be refilled? (please list name of each medication and dose if known) Carvedilol- sent to the wrong pharmacy  2. Which pharmacy/location (including street and city if local pharmacy) is medication to be sent to? [facility] [email or web address]  3. Do they need a 30 day or 90 day supply? 190 and refills

## 2017-11-26 DIAGNOSIS — H33312 Horseshoe tear of retina without detachment, left eye: Secondary | ICD-10-CM | POA: Diagnosis not present

## 2017-11-26 DIAGNOSIS — H35372 Puckering of macula, left eye: Secondary | ICD-10-CM | POA: Diagnosis not present

## 2017-12-01 DIAGNOSIS — H59811 Chorioretinal scars after surgery for detachment, right eye: Secondary | ICD-10-CM | POA: Diagnosis not present

## 2017-12-01 DIAGNOSIS — H4312 Vitreous hemorrhage, left eye: Secondary | ICD-10-CM | POA: Diagnosis not present

## 2017-12-01 DIAGNOSIS — H53132 Sudden visual loss, left eye: Secondary | ICD-10-CM | POA: Diagnosis not present

## 2017-12-01 DIAGNOSIS — H35372 Puckering of macula, left eye: Secondary | ICD-10-CM | POA: Diagnosis not present

## 2017-12-01 DIAGNOSIS — H33312 Horseshoe tear of retina without detachment, left eye: Secondary | ICD-10-CM | POA: Diagnosis not present

## 2017-12-01 DIAGNOSIS — Z961 Presence of intraocular lens: Secondary | ICD-10-CM | POA: Diagnosis not present

## 2017-12-08 DIAGNOSIS — H33312 Horseshoe tear of retina without detachment, left eye: Secondary | ICD-10-CM | POA: Diagnosis not present

## 2017-12-11 DIAGNOSIS — H35412 Lattice degeneration of retina, left eye: Secondary | ICD-10-CM | POA: Diagnosis not present

## 2017-12-11 DIAGNOSIS — H35372 Puckering of macula, left eye: Secondary | ICD-10-CM | POA: Diagnosis not present

## 2017-12-11 DIAGNOSIS — H4312 Vitreous hemorrhage, left eye: Secondary | ICD-10-CM | POA: Diagnosis not present

## 2017-12-16 DIAGNOSIS — H4312 Vitreous hemorrhage, left eye: Secondary | ICD-10-CM | POA: Diagnosis not present

## 2017-12-16 DIAGNOSIS — H33332 Multiple defects of retina without detachment, left eye: Secondary | ICD-10-CM | POA: Diagnosis not present

## 2017-12-26 DIAGNOSIS — H35372 Puckering of macula, left eye: Secondary | ICD-10-CM | POA: Diagnosis not present

## 2017-12-26 NOTE — Addendum Note (Signed)
Encounter addended by: Robyne Peers, RN on: 12/26/2017 11:51 AM  Actions taken: Episode resolved, Sign clinical note

## 2017-12-26 NOTE — Progress Notes (Signed)
Discharge Progress Report  Patient Details  Name: Wayne Mitchell MRN: [id number] Date of Birth: [birth year] Referring Provider:   Flowsheet Row CARDIAC REHAB PHASE II ORIENTATION from 08/12/2017 in [facility]  Referring Provider  Daneen Schick MD       Number of Visits: 33  Reason for Discharge:  Patient has met program and personal goals.  Smoking History:  Social History   Tobacco Use  Smoking Status Never Smoker  Smokeless Tobacco Never Used    Diagnosis:  06/02/17 S/P CABG x 3  ADL [facility]:   Initial Exercise Prescription: Initial Exercise Prescription - 08/12/17 1000    Date of Initial Exercise RX and Referring Provider          Date  08/12/17    Referring Provider  Daneen Schick MD    Expected Discharge Date  11/12/17        Bike          Level  0.5    Minutes  10    METs  2.4        NuStep          Level  2    SPM  70    Minutes  10    METs  2        Track          Laps  10    Minutes  10    METs  2.74        Prescription Details          Frequency (times per week)  3    Duration  Progress to 30 minutes of continuous aerobic without signs/symptoms of physical distress        Intensity          THRR 40-80% of Max Heartrate  56-113    Ratings of Perceived Exertion  11-13    Perceived Dyspnea  0-4        Progression          Progression  Continue to progress workloads to maintain intensity without signs/symptoms of physical distress.        Resistance Training          Training Prescription  Yes    Weight  2lbs    Reps  10-15           Discharge Exercise Prescription (Final Exercise Prescription Changes): Exercise Prescription Changes - 11/19/17 1500    Response to Exercise          Blood Pressure (Admit)  132/60    Blood Pressure (Exercise)  120/60    Blood Pressure (Exit)  118/82    Heart Rate (Admit)  91 bpm    Heart Rate (Exercise)  109 bpm    Heart Rate (Exit)  60 bpm    Rating of  Perceived Exertion (Exercise)  13    Perceived Dyspnea (Exercise)  0    Symptoms  None    Comments  Pt graduated from Cardiac Rehab     Duration  Progress to 45 minutes of aerobic exercise without signs/symptoms of physical distress    Intensity  THRR unchanged        Progression          Progression  Continue to progress workloads to maintain intensity without signs/symptoms of physical distress.    Average METs  4.24        Resistance Training  Training Prescription  No        Interval Training          Interval Training  No        Bike          Level  1.2    Minutes  10    METs  4.41        NuStep          Level  5    SPM  105    Minutes  10    METs  4.2        Track          Laps  14    Minutes  10    METs  4.13        Home Exercise Plan          Plans to continue exercise at  Home (comment)   Walking   Frequency  Add 3 additional days to program exercise sessions.    Initial Home Exercises Provided  09/03/17           Functional Capacity: 6 Minute Walk    6 Minute Walk    Row Name 08/12/17 1054 10/22/17 0936   Phase  Initial  Discharge   Distance  1456 feet  1971 feet   Distance % Change  no documentation  35.37 %   Distance Feet Change  no documentation  515 ft   Walk Time  6 minutes  6 minutes   # of Rest Breaks  0  0   MPH  2.76  3.74   METS  3.09  4.18   RPE  10  11   Perceived Dyspnea   no documentation  0   VO2 Peak  10.83  14.61   Symptoms  No  No   Resting HR  99 bpm  84 bpm   Resting BP  112/62  120/80   Resting Oxygen Saturation   98 %  no documentation   Exercise Oxygen Saturation  during 6 min walk  98 %  no documentation   Max Ex. HR  115 bpm  117 bpm   Max Ex. BP  134/78  158/74   2 Minute Post BP  130/70  118/70          Psychological, QOL, Others - Outcomes: PHQ 2/9: Depression screen PHQ 2/9 08/20/2017  Decreased Interest 0  Down, Depressed, Hopeless 0  PHQ - 2 Score 0    Quality of  Life: Quality of Life - 11/17/17 1352    Quality of Life          Select  Quality of Life        Quality of Life Scores          Health/Function Pre  26.2 %    Health/Function Post  28.3 %    Health/Function % Change  8.02 %    Socioeconomic Pre  30 %    Socioeconomic Post  27.92 %    Socioeconomic % Change   -6.93 %    Psych/Spiritual Pre  29.14 %    Psych/Spiritual Post  25.71 %    Psych/Spiritual % Change  -11.77 %    Family Pre  30 %    Family Post  30 %    Family % Change  0 %    GLOBAL Pre  28.21 %    GLOBAL Post  27.94 %    GLOBAL % Change  -0.96 %  Personal Goals: Goals established at orientation with interventions provided to work toward goal. Personal Goals and Risk Factors at Admission - 08/12/17 0936    Core Components/Risk Factors/Patient Goals on Admission           Weight Management  Yes;Weight Maintenance    Intervention  Weight Management: Develop a combined nutrition and exercise program designed to reach desired caloric intake, while maintaining appropriate intake of nutrient and fiber, sodium and fats, and appropriate energy expenditure required for the weight goal.;Weight Management: Provide education and appropriate resources to help participant work on and attain dietary goals.    Admit Weight  149 lb 0.5 oz (67.6 kg)    Goal Weight: Short Term  149 lb (67.6 kg)    Goal Weight: Long Term  149 lb (67.6 kg)    Expected Outcomes  Short Term: Continue to assess and modify interventions until short term weight is achieved;Long Term: Adherence to nutrition and physical activity/exercise program aimed toward attainment of established weight goal;Weight Maintenance: Understanding of the daily nutrition guidelines, which includes 25-35% calories from fat, 7% or less cal from saturated fats, less than 277m cholesterol, less than 1.5gm of sodium, & 5 or more servings of fruits and vegetables daily;Understanding recommendations for meals to include 15-35%  energy as protein, 25-35% energy from fat, 35-60% energy from carbohydrates, less than 2073mof dietary cholesterol, 20-35 gm of total fiber daily;Understanding of distribution of calorie intake throughout the day with the consumption of 4-5 meals/snacks    Hypertension  Yes    Intervention  Provide education on lifestyle modifcations including regular physical activity/exercise, weight management, moderate sodium restriction and increased consumption of fresh fruit, vegetables, and low fat dairy, alcohol moderation, and smoking cessation.;Monitor prescription use compliance.    Expected Outcomes  Short Term: Continued assessment and intervention until BP is < 140/907mG in hypertensive participants. < 130/7m22m in hypertensive participants with diabetes, heart failure or chronic kidney disease.;Long Term: Maintenance of blood pressure at goal levels.    Lipids  Yes    Intervention  Provide education and support for participant on nutrition & aerobic/resistive exercise along with prescribed medications to achieve LDL <70mg19mL >40mg.70mExpected Outcomes  Short Term: Participant states understanding of desired cholesterol values and is compliant with medications prescribed. Participant is following exercise prescription and nutrition guidelines.;Long Term: Cholesterol controlled with medications as prescribed, with individualized exercise RX and with personalized nutrition plan. Value goals: LDL < 70mg, 13m> 40 mg.            Personal Goals Discharge: Goals and Risk Factor Review    Core Components/Risk Factors/Patient Goals Review    Row Name 08/20/17 1217 09/16/17 1134 10/15/17 1712 11/13/17 1522 11/19/17 1113   Personal Goals Review  Weight Management/Obesity;Hypertension;Lipids  Weight Management/Obesity;Hypertension;Lipids  Weight Management/Obesity;Hypertension;Lipids  Weight Management/Obesity;Hypertension;Lipids  Weight Management/Obesity;Hypertension;Lipids   Review  pt with CAD RF  demonstrates willingness to participate in CR program. pt personal goal to resume pleasure activities including yardwork and playing golf. pt also eager to finish healing process and restore health. pt encouraged to participate in CR exercise, nutrition and lifestyle modification opporunities.   no documentation  pt with CAD RF demonstrates willingness to participate in CR program. pt personal goal to resume pleasure activities including yardwork and playing golf. pt also eager to finish healing process and restore health. pt stamina improved, MET level increased.    pt with CAD RF demonstrates willingness to participate in CR program. pt  personal goal to resume pleasure activities including yardwork and playing golf. pt also eager to finish healing process and restore health. pt stamina improved, MET level increased.    pt with CAD RF demonstrates willingness to participate in CR program. pt personal goal to resume pleasure activities including yardwork and playing golf. pt also eager to finish healing process and restore health. pt stamina improved, MET level increased.  pt most pleased with his exercise consistency, increased strength/stamina and endurance.however overall pt wishes he could have done more. pt plans to continue exercising on his own and following heart healthy diet with his wife.     Expected Outcomes  pt will participate in CR exercise, nutrition and lifestyle modification to decrease overall RF.   no documentation  pt will participate in CR exercise, nutrition and lifestyle modification to decrease overall RF.   pt will participate in CR exercise, nutrition and lifestyle modification to decrease overall RF.   pt will participate in CR exercise, nutrition and lifestyle modification to decrease overall RF.           Exercise Goals and Review: Exercise Goals    Exercise Goals    Row Name 08/12/17 0935   Increase Physical Activity  Yes   Intervention  Provide advice, education, support  and counseling about physical activity/exercise needs.;Develop an individualized exercise prescription for aerobic and resistive training based on initial evaluation findings, risk stratification, comorbidities and participant's personal goals.   Expected Outcomes  Short Term: Attend rehab on a regular basis to increase amount of physical activity.;Long Term: Add in home exercise to make exercise part of routine and to increase amount of physical activity.;Long Term: Exercising regularly at least 3-5 days a week.   Increase Strength and Stamina  Yes   Intervention  Provide advice, education, support and counseling about physical activity/exercise needs.;Develop an individualized exercise prescription for aerobic and resistive training based on initial evaluation findings, risk stratification, comorbidities and participant's personal goals.   Expected Outcomes  Short Term: Increase workloads from initial exercise prescription for resistance, speed, and METs.;Short Term: Perform resistance training exercises routinely during rehab and add in resistance training at home;Long Term: Improve cardiorespiratory fitness, muscular endurance and strength as measured by increased METs and functional capacity (6MWT)   Able to understand and use rate of perceived exertion (RPE) scale  Yes   Intervention  Provide education and explanation on how to use RPE scale   Expected Outcomes  Long Term:  Able to use RPE to guide intensity level when exercising independently;Short Term: Able to use RPE daily in rehab to express subjective intensity level   Able to understand and use Dyspnea scale  Yes   Intervention  Provide education and explanation on how to use Dyspnea scale   Expected Outcomes  Short Term: Able to use Dyspnea scale daily in rehab to express subjective sense of shortness of breath during exertion;Long Term: Able to use Dyspnea scale to guide intensity level when exercising independently   Knowledge and  understanding of Target Heart Rate Range (THRR)  Yes   Intervention  Provide education and explanation of THRR including how the numbers were predicted and where they are located for reference   Expected Outcomes  Short Term: Able to state/look up THRR;Long Term: Able to use THRR to govern intensity when exercising independently;Short Term: Able to use daily as guideline for intensity in rehab   Able to check pulse independently  Yes   Intervention  Provide education and demonstration on  how to check pulse in carotid and radial arteries.;Review the importance of being able to check your own pulse for safety during independent exercise   Expected Outcomes  Short Term: Able to explain why pulse checking is important during independent exercise;Long Term: Able to check pulse independently and accurately   Understanding of Exercise Prescription  Yes   Intervention  Provide education, explanation, and written materials on patient's individual exercise prescription   Expected Outcomes  Short Term: Able to explain program exercise prescription;Long Term: Able to explain home exercise prescription to exercise independently          Exercise Goals Re-Evaluation: Exercise Goals Re-Evaluation    Exercise Goal Re-Evaluation    Row Name 08/20/17 1447 09/03/17 1141 10/06/17 1725 11/13/17 1343 11/19/17 1538   Exercise Goals Review  Increase Physical Activity;Increase Strength and Stamina  Increase Physical Activity;Able to understand and use rate of perceived exertion (RPE) scale;Knowledge and understanding of Target Heart Rate Range (THRR);Understanding of Exercise Prescription;Increase Strength and Stamina;Able to check pulse independently;Improve claudication pain tolerance and improve walking ability  Increase Physical Activity;Able to understand and use rate of perceived exertion (RPE) scale;Knowledge and understanding of Target Heart Rate Range (THRR);Understanding of Exercise Prescription;Increase Strength  and Stamina;Able to check pulse independently;Improve claudication pain tolerance and improve walking ability  Increase Physical Activity;Understanding of Exercise Prescription  Increase Physical Activity;Understanding of Exercise Prescription   Comments  Pt was oriented to exercise equipment today. Pt was able to complete exercise session without sings/symptoms of physical distress.   Reviewed HEP in which pt plans to walk at home for 15-20 minutes and slowly progress to 30 minutes. Pt will walk 2x/week in addiiton to coming to cardiac rehab. Pt is also understanding of temperature/emergency precautions.  Pt has returned to dancing. Pt feels great and ready to mow the lawn. Discussed increasing MET level to a 5 before pushing a self-propel mower. Pt is currently averaging 3.5-4.0 METs. Will f/u  Pt is continuing to tolerate exercise will. Will follow up with pt regarding trying level 6 on Nustep. Pt is continuing to increase stamina. Will continue to monitor.   Pt completed 33 sessions of Cardiac Rehab. Pt increased functional capacity by 35.37%. Pt increased distance on post 6MWT by 515 ft. One of pt's goal was to decrease dyspena with exertion. Pt increased his stamina and stopped reporting any dyspena with exercise.    Expected Outcomes  Pt will be able to exercise 30 minutes without difficulty and improve in cardiorespiratory fitness  Pt will be able to exercise 30 minutes without difficulty and improve in cardiorespiratory fitness  Pt will continue to improve in MET levels and CR fitness to be able to mow the lawn with a self propel mower.   Pt will continue to increase cardiorespiratory fitness. Pt will be graduating from rehab next week. Will follow up with pt regarding exercising post Cardiac Rehab.   Pt will continue to exercise 3-4 days a week for 30-45 minutes, by walking. Pt does have membership at [facility]. Pt's wife plans to join him there for exercise or at [employer].           Nutrition  & Weight - Outcomes: Pre Biometrics - 08/12/17 1058    Pre Biometrics          Height  _0  (1.702 m)    Weight  67.6 kg    Waist Circumference  33 inches    Hip Circumference  38.25 inches    Waist  to Hip Ratio  0.86 %    BMI (Calculated)  23.34    Triceps Skinfold  23 mm    % Body Fat  24.9 %    Grip Strength  29 kg    Flexibility  0 in    Single Leg Stand  6.35 seconds          Post Biometrics - 10/22/17 9155     Post  Biometrics          Height  _0  (1.702 m)    Weight  66.3 kg    Waist Circumference  34 inches    Hip Circumference  37 inches    Waist to Hip Ratio  0.92 %    BMI (Calculated)  22.89    Triceps Skinfold  10 mm    % Body Fat  21.8 %    Grip Strength  32 kg    Flexibility  0 in    Single Leg Stand  3.25 seconds           Nutrition: Nutrition Therapy & Goals - 11/28/17 1018    Nutrition Therapy          Diet  consistent carbohydrate heart healthy        Personal Nutrition Goals          Nutrition Goal  identify and limit food sources of saturated fat, trans fat, and sodium        Intervention Plan          Intervention  Prescribe, educate and counsel regarding individualized specific dietary modifications aiming towards targeted core components such as weight, hypertension, lipid management, diabetes, heart failure and other comorbidities.    Expected Outcomes  Short Term Goal: Understand basic principles of dietary content, such as calories, fat, sodium, cholesterol and nutrients.           Nutrition Discharge: Nutrition Assessments - 11/28/17 1018    MEDFICTS Scores          Pre Score  25    Post Score  17    Score Difference  -8           Education Questionnaire Score: Knowledge Questionnaire Score - 11/17/17 1352    Knowledge Questionnaire Score          Pre Score  20/24    Post Score  23/24           Goals reviewed with patient; copy given to patient.

## 2018-01-14 ENCOUNTER — Encounter: Payer: Self-pay | Admitting: Cardiothoracic Surgery

## 2018-01-14 ENCOUNTER — Ambulatory Visit: Payer: Medicare HMO | Admitting: Cardiothoracic Surgery

## 2018-01-14 VITALS — BP 110/64 | HR 60 | Resp 20 | Ht 68.0 in | Wt 152.0 lb

## 2018-01-14 DIAGNOSIS — I25119 Atherosclerotic heart disease of native coronary artery with unspecified angina pectoris: Secondary | ICD-10-CM

## 2018-01-14 DIAGNOSIS — Z951 Presence of aortocoronary bypass graft: Secondary | ICD-10-CM | POA: Diagnosis not present

## 2018-01-14 NOTE — Progress Notes (Signed)
PCP is Georgann Housekeeper, MD Referring Provider is Lyn Records, MD  Chief Complaint  Patient presents with  . Follow-up    6 month f/u, HX of CABG x3 06/02/17    HPI: Postop visit after multivessel CABG for unstable angina April 2019. Patient doing well without recurrent angina, symptoms of CHF. Surgical incisions well-healed. Patient followed by Dr. Verdis Prime on aspirin, Lipitor, carvedilol and Norvasc.  Past Medical History:  Diagnosis Date  . Carotid artery disease (HCC)   . Colon polyp    small  . Coronary artery disease   . Coronary artery disease involving native coronary artery of native heart with unstable angina pectoris (HCC) 05/30/2017   3V CABG 06/02/17  . HTN (hypertension)   . Hypothyroidism   . Syncope   . Syncope   . Thrombocytopenia (HCC)     Past Surgical History:  Procedure Laterality Date  . CATARACT EXTRACTION Left   . CORONARY ARTERY BYPASS GRAFT N/A 06/02/2017   Procedure: CORONARY ARTERY BYPASS GRAFTING (CABG) x3 Using Left Internal Mammary Artery and Right Leg Greater Saphenous Vein harvested endoscopically;  Surgeon: Kerin Perna, MD;  Location: [facility] OR;  Service: Open Heart Surgery;  Laterality: N/A;  . ESOPHAGOGASTRODUODENOSCOPY N/A 10/23/2014   Procedure: ESOPHAGOGASTRODUODENOSCOPY (EGD);  Surgeon: Bernette Redbird, MD;  Location: [facility] ENDOSCOPY;  Service: Endoscopy;  Laterality: N/A;  . GAS/FLUID EXCHANGE Right 10/24/2017   Procedure: GAS/FLUID EXCHANGE;  Surgeon: Carmela Rima, MD;  Location: [facility] OR;  Service: Ophthalmology;  Laterality: Right;  SF6  . HEMORRHOID SURGERY    . LEFT HEART CATH AND CORONARY ANGIOGRAPHY N/A 05/30/2017   Procedure: LEFT HEART CATH AND CORONARY ANGIOGRAPHY;  Surgeon: [name], MD;  Location: [facility] INVASIVE CV LAB;  Service: Cardiovascular;  Laterality: N/A;  . PARS PLANA VITRECTOMY Right 10/24/2017   Procedure: PARS PLANA VITRECTOMY WITH 25 GAUGE;  Surgeon: Carmela Rima, MD;  Location: [facility] OR;  Service:  Ophthalmology;  Laterality: Right;  . PHOTOCOAGULATION Right 10/24/2017   Procedure: PHOTOCOAGULATION;  Surgeon: Carmela Rima, MD;  Location: [facility] OR;  Service: Ophthalmology;  Laterality: Right;  . REFRACTIVE SURGERY Right   . TEE WITHOUT CARDIOVERSION N/A 06/02/2017   Procedure: TRANSESOPHAGEAL ECHOCARDIOGRAM (TEE);  Surgeon: Donata Clay, Theron Arista, MD;  Location: [facility] OR;  Service: Open Heart Surgery;  Laterality: N/A;    Family History  Problem Relation Age of Onset  . Pancreatic cancer Mother   . Healthy Brother     Social History Social History   Tobacco Use  . Smoking status: Never Smoker  . Smokeless tobacco: Never Used  Substance Use Topics  . Alcohol use: Yes    Alcohol/week: 4.0 standard drinks    Types: 4 Cans of beer per week  . Drug use: No    Current Outpatient Medications  Medication Sig Dispense Refill  . acetaminophen (TYLENOL) 325 MG tablet Take 2 tablets (650 mg total) by mouth every 6 (six) hours as needed for mild pain.    Marland Kitchen amLODipine (NORVASC) 5 MG tablet Take 5 mg by mouth daily.    Marland Kitchen aspirin EC 81 MG tablet Take 81 mg by mouth See admin instructions. Twice monthly... Pt takes 1 pill on the 1st and 1 pill on the 15th    . atorvastatin (LIPITOR) 20 MG tablet Take 20 mg by mouth daily.    . carvedilol (COREG) 3.125 MG tablet Take 1 tablet (3.125 mg total) by mouth 2 (two) times daily with a meal. 180 tablet 3  . levothyroxine (SYNTHROID,  LEVOTHROID) 112 MCG tablet Take 112 mcg by mouth daily before breakfast.     No current facility-administered medications for this visit.     Allergies  Allergen Reactions  . Lisinopril Cough    Cough     Review of Systems  Weight stable Patient has needed several retinal operations since his heart operation Patient completed phase 2 outpatient cardiac rehab. Patient understands importance of heart healthy lifestyle-diet and activity  BP 110/64   Pulse 60   Resp 20   Ht 5\' 8"  (1.727 m)   Wt 152 lb (68.9 kg)    SpO2 98% Comment: RA  BMI 23.11 kg/m  Physical Exam      Exam    General- alert and comfortable    Neck- no JVD, no cervical adenopathy palpable, no carotid bruit   Lungs- clear without rales, wheezes   Cor- regular rate and rhythm, no murmur , gallop   Abdomen- soft, non-tender   Extremities - warm, non-tender, minimal edema   Neuro- oriented, appropriate, no focal weakness   Diagnostic Tests: None  Impression: Excellent recovery after multivessel CABG  Plan: Continue current medications.  Continue heart healthy lifestyle-diet and activity.  No sternal precautions at this point.  Return as needed.   [name], MD [facility] [phone]

## 2018-01-23 DIAGNOSIS — H35412 Lattice degeneration of retina, left eye: Secondary | ICD-10-CM | POA: Diagnosis not present

## 2018-01-23 DIAGNOSIS — H35373 Puckering of macula, bilateral: Secondary | ICD-10-CM | POA: Diagnosis not present

## 2018-01-23 DIAGNOSIS — H3122 Choroidal dystrophy (central areolar) (generalized) (peripapillary): Secondary | ICD-10-CM | POA: Diagnosis not present

## 2018-01-23 DIAGNOSIS — Z961 Presence of intraocular lens: Secondary | ICD-10-CM | POA: Diagnosis not present

## 2018-01-23 DIAGNOSIS — H35411 Lattice degeneration of retina, right eye: Secondary | ICD-10-CM | POA: Diagnosis not present

## 2018-01-23 DIAGNOSIS — H02401 Unspecified ptosis of right eyelid: Secondary | ICD-10-CM | POA: Diagnosis not present

## 2018-01-28 DIAGNOSIS — I10 Essential (primary) hypertension: Secondary | ICD-10-CM | POA: Diagnosis not present

## 2018-01-28 DIAGNOSIS — E039 Hypothyroidism, unspecified: Secondary | ICD-10-CM | POA: Diagnosis not present

## 2018-01-28 DIAGNOSIS — N183 Chronic kidney disease, stage 3 (moderate): Secondary | ICD-10-CM | POA: Diagnosis not present

## 2018-02-13 ENCOUNTER — Other Ambulatory Visit: Payer: Self-pay

## 2018-02-13 ENCOUNTER — Encounter (HOSPITAL_COMMUNITY): Payer: Self-pay | Admitting: *Deleted

## 2018-02-13 ENCOUNTER — Other Ambulatory Visit: Payer: Self-pay | Admitting: Gastroenterology

## 2018-02-13 ENCOUNTER — Ambulatory Visit (HOSPITAL_COMMUNITY)
Admission: RE | Admit: 2018-02-13 | Discharge: 2018-02-13 | Disposition: A | Discharge status: Home / Self Care | Payer: Medicare HMO | Source: Other Acute Inpatient Hospital | Attending: Gastroenterology | Admitting: Gastroenterology

## 2018-02-13 ENCOUNTER — Ambulatory Visit
Admission: RE | Admit: 2018-02-13 | Discharge: 2018-02-13 | Disposition: A | Discharge status: Home / Self Care | Payer: Medicare HMO | Source: Ambulatory Visit | Attending: Gastroenterology | Admitting: Gastroenterology

## 2018-02-13 ENCOUNTER — Ambulatory Visit (HOSPITAL_COMMUNITY): Payer: Medicare HMO | Admitting: Anesthesiology

## 2018-02-13 ENCOUNTER — Encounter (HOSPITAL_COMMUNITY)
Admission: RE | Disposition: A | Discharge status: Home / Self Care | Payer: Self-pay | Source: Other Acute Inpatient Hospital | Attending: Gastroenterology

## 2018-02-13 DIAGNOSIS — T18128A Food in esophagus causing other injury, initial encounter: Secondary | ICD-10-CM | POA: Insufficient documentation

## 2018-02-13 DIAGNOSIS — X58XXXA Exposure to other specified factors, initial encounter: Secondary | ICD-10-CM | POA: Insufficient documentation

## 2018-02-13 DIAGNOSIS — K269 Duodenal ulcer, unspecified as acute or chronic, without hemorrhage or perforation: Secondary | ICD-10-CM | POA: Insufficient documentation

## 2018-02-13 DIAGNOSIS — E039 Hypothyroidism, unspecified: Secondary | ICD-10-CM | POA: Diagnosis not present

## 2018-02-13 DIAGNOSIS — K3189 Other diseases of stomach and duodenum: Secondary | ICD-10-CM | POA: Diagnosis not present

## 2018-02-13 DIAGNOSIS — Z951 Presence of aortocoronary bypass graft: Secondary | ICD-10-CM | POA: Insufficient documentation

## 2018-02-13 DIAGNOSIS — K228 Other specified diseases of esophagus: Secondary | ICD-10-CM | POA: Diagnosis not present

## 2018-02-13 DIAGNOSIS — I10 Essential (primary) hypertension: Secondary | ICD-10-CM | POA: Diagnosis not present

## 2018-02-13 DIAGNOSIS — I2510 Atherosclerotic heart disease of native coronary artery without angina pectoris: Secondary | ICD-10-CM | POA: Diagnosis not present

## 2018-02-13 DIAGNOSIS — Z79899 Other long term (current) drug therapy: Secondary | ICD-10-CM | POA: Insufficient documentation

## 2018-02-13 DIAGNOSIS — R1310 Dysphagia, unspecified: Secondary | ICD-10-CM

## 2018-02-13 DIAGNOSIS — R1319 Other dysphagia: Secondary | ICD-10-CM

## 2018-02-13 DIAGNOSIS — K209 Esophagitis, unspecified: Secondary | ICD-10-CM | POA: Diagnosis not present

## 2018-02-13 DIAGNOSIS — K2950 Unspecified chronic gastritis without bleeding: Secondary | ICD-10-CM | POA: Diagnosis not present

## 2018-02-13 DIAGNOSIS — Z79890 Hormone replacement therapy (postmenopausal): Secondary | ICD-10-CM | POA: Diagnosis not present

## 2018-02-13 DIAGNOSIS — E785 Hyperlipidemia, unspecified: Secondary | ICD-10-CM | POA: Diagnosis not present

## 2018-02-13 DIAGNOSIS — K2270 Barrett's esophagus without dysplasia: Secondary | ICD-10-CM | POA: Insufficient documentation

## 2018-02-13 DIAGNOSIS — T18108A Unspecified foreign body in esophagus causing other injury, initial encounter: Secondary | ICD-10-CM | POA: Diagnosis not present

## 2018-02-13 DIAGNOSIS — I25119 Atherosclerotic heart disease of native coronary artery with unspecified angina pectoris: Secondary | ICD-10-CM | POA: Diagnosis not present

## 2018-02-13 HISTORY — PX: BALLOON DILATION: SHX5330

## 2018-02-13 HISTORY — PX: FOREIGN BODY REMOVAL: SHX962

## 2018-02-13 HISTORY — PX: ESOPHAGOGASTRODUODENOSCOPY (EGD) WITH PROPOFOL: SHX5813

## 2018-02-13 HISTORY — PX: BIOPSY: SHX5522

## 2018-02-13 SURGERY — ESOPHAGOGASTRODUODENOSCOPY (EGD) WITH PROPOFOL
Anesthesia: General

## 2018-02-13 MED ORDER — LACTATED RINGERS IV SOLN
INTRAVENOUS | Status: DC
  Administered 2018-02-13: 1000 mL via INTRAVENOUS

## 2018-02-13 MED ORDER — PROPOFOL 10 MG/ML IV BOLUS
INTRAVENOUS | Status: DC | PRN
  Administered 2018-02-13: 140 mg via INTRAVENOUS

## 2018-02-13 MED ORDER — FENTANYL CITRATE (PF) 100 MCG/2ML IJ SOLN
INTRAMUSCULAR | Status: AC
  Filled 2018-02-13: qty 2

## 2018-02-13 MED ORDER — PROPOFOL 10 MG/ML IV BOLUS
INTRAVENOUS | Status: AC
  Filled 2018-02-13: qty 20

## 2018-02-13 MED ORDER — LIDOCAINE 2% (20 MG/ML) 5 ML SYRINGE
INTRAMUSCULAR | Status: DC | PRN
  Administered 2018-02-13: 80 mg via INTRAVENOUS

## 2018-02-13 MED ORDER — FENTANYL CITRATE (PF) 100 MCG/2ML IJ SOLN
INTRAMUSCULAR | Status: DC | PRN
  Administered 2018-02-13 (×2): 50 ug via INTRAVENOUS

## 2018-02-13 MED ORDER — SUCCINYLCHOLINE CHLORIDE 200 MG/10ML IV SOSY
PREFILLED_SYRINGE | INTRAVENOUS | Status: DC | PRN
  Administered 2018-02-13: 80 mg via INTRAVENOUS

## 2018-02-13 MED ORDER — ONDANSETRON HCL 4 MG/2ML IJ SOLN
INTRAMUSCULAR | Status: DC | PRN
  Administered 2018-02-13: 4 mg via INTRAVENOUS

## 2018-02-13 MED ORDER — PHENYLEPHRINE 40 MCG/ML (10ML) SYRINGE FOR IV PUSH (FOR BLOOD PRESSURE SUPPORT)
PREFILLED_SYRINGE | INTRAVENOUS | Status: DC | PRN
  Administered 2018-02-13: 80 ug via INTRAVENOUS

## 2018-02-13 SURGICAL SUPPLY — 15 items

## 2018-02-13 NOTE — Anesthesia Postprocedure Evaluation (Signed)
Anesthesia Post Note  Patient: Wayne Mitchell  Procedure(s) Performed: ESOPHAGOGASTRODUODENOSCOPY (EGD) WITH PROPOFOL (N/A ) BIOPSY BALLOON DILATION (N/A ) FOREIGN BODY REMOVAL     Patient location during evaluation: PACU Anesthesia Type: General Level of consciousness: awake and alert Pain management: pain level controlled Vital Signs Assessment: post-procedure vital signs reviewed and stable Respiratory status: spontaneous breathing, nonlabored ventilation and respiratory function stable Cardiovascular status: blood pressure returned to baseline and stable Postop Assessment: no apparent nausea or vomiting Anesthetic complications: no    Last Vitals:  Vitals:   02/13/18 1610 02/13/18 1620  BP: (!) 144/71 (!) 160/67  Pulse: 72 67  Resp: 20 15  Temp:    SpO2: 99% 100%    Last Pain:  Vitals:   02/13/18 1620  TempSrc:   PainSc: 0-No pain                 Lucretia Kern

## 2018-02-13 NOTE — H&P (Signed)
Wayne Mitchell is an 83 y.o. male.   Chief Complaint: Esophageal food bolus impaction HPI: 83 year old male was in his usual state of health until 5 days prior to presentation when he felt a piece of chicken getting lodged in his esophagus.  Since then he has only been able to swallow liquids like water or thin soup.  He has experienced a lot of drooling and buildup of mucus in his mouth.  As he continued to have food bolus sensation in his lower chest he presented to the office and was sent for a barium swallow which confirmed presence of foreign body in the esophagus. Patient has had esophageal food bolus impaction in 2016 requiring EGD. Since then he is careful with how he eats and is cautious with certain foods, especially solids.  Past Medical History:  Diagnosis Date  . Carotid artery disease (HCC)   . Colon polyp    small  . Coronary artery disease   . Coronary artery disease involving native coronary artery of native heart with unstable angina pectoris (HCC) 05/30/2017   3V CABG 06/02/17  . HTN (hypertension)   . Hypothyroidism   . Syncope   . Syncope   . Thrombocytopenia (HCC)     Past Surgical History:  Procedure Laterality Date  . CATARACT EXTRACTION Left   . CORONARY ARTERY BYPASS GRAFT N/A 06/02/2017   Procedure: CORONARY ARTERY BYPASS GRAFTING (CABG) x3 Using Left Internal Mammary Artery and Right Leg Greater Saphenous Vein harvested endoscopically;  Surgeon: Kerin PernaVan Trigt, Peter, MD;  [facility];  Service: Open Heart Surgery;  Laterality: N/A;  . ESOPHAGOGASTRODUODENOSCOPY N/A 10/23/2014   Procedure: ESOPHAGOGASTRODUODENOSCOPY (EGD);  Surgeon: Bernette Redbirdobert Buccini, MD;  [facility];  Service: Endoscopy;  Laterality: N/A;  . GAS/FLUID EXCHANGE Right 10/24/2017   Procedure: GAS/FLUID EXCHANGE;  Surgeon: Carmela RimaPatel, Narendra, MD;  [facility];  Service: Ophthalmology;  Laterality: Right;  SF6  . HEMORRHOID SURGERY    . LEFT HEART CATH AND CORONARY ANGIOGRAPHY N/A 05/30/2017    Procedure: LEFT HEART CATH AND CORONARY ANGIOGRAPHY;  Surgeon: Tonny Bollmanooper, Michael, MD;  [facility];  Service: Cardiovascular;  Laterality: N/A;  . PARS PLANA VITRECTOMY Right 10/24/2017   Procedure: PARS PLANA VITRECTOMY WITH 25 GAUGE;  Surgeon: Carmela RimaPatel, Narendra, MD;  [facility];  Service: Ophthalmology;  Laterality: Right;  . PHOTOCOAGULATION Right 10/24/2017   Procedure: PHOTOCOAGULATION;  Surgeon: Carmela RimaPatel, Narendra, MD;  [facility];  Service: Ophthalmology;  Laterality: Right;  . REFRACTIVE SURGERY Right   . TEE WITHOUT CARDIOVERSION N/A 06/02/2017   Procedure: TRANSESOPHAGEAL ECHOCARDIOGRAM (TEE);  Surgeon: Donata ClayVan Trigt, Theron AristaPeter, MD;  [facility];  Service: Open Heart Surgery;  Laterality: N/A;    Family History  Problem Relation Age of Onset  . Pancreatic cancer Mother   . Healthy Brother    Social History:  reports that he has never smoked. He has never used smokeless tobacco. He reports current alcohol use of about 4.0 standard drinks of alcohol per week. He reports that he does not use drugs.  Allergies:  Allergies  Allergen Reactions  . Lisinopril Cough    Cough     Medications Prior to Admission  Medication Sig Dispense Refill  . acetaminophen (TYLENOL) 325 MG tablet Take 2 tablets (650 mg total) by mouth every 6 (six) hours as needed for mild pain.    Marland Kitchen. amLODipine (NORVASC) 5 MG tablet Take 5 mg by mouth daily.    Marland Kitchen. aspirin EC 81 MG tablet Take  81 mg by mouth See admin instructions. Twice monthly... Pt takes 1 pill on the 1st and 1 pill on the 15th    . atorvastatin (LIPITOR) 20 MG tablet Take 20 mg by mouth daily.    . carvedilol (COREG) 3.125 MG tablet Take 1 tablet (3.125 mg total) by mouth 2 (two) times daily with a meal. 180 tablet 3  . levothyroxine (SYNTHROID, LEVOTHROID) 112 MCG tablet Take 112 mcg by mouth daily before breakfast.      No results found for this or any previous visit (from the past 48 hour(s)). Dg Esophagus Inc Scout Chest &  Delayed Img Double Cm  Result Date: 02/13/2018 CLINICAL DATA:  83 year old male with suspected food impaction in the esophagus. Reports he was eating salad with check-in 5 days ago and inadvertently swallowed a piece of chicken which he thought was too big. Since that time only able to tolerate small volume liquids. Stuck food sensation his lower chest, although mildly decreasing over time. EXAM: ESOPHOGRAM/BARIUM SWALLOW TECHNIQUE: Single contrast examination was performed using  thin barium. FLUOROSCOPY TIME:  Fluoroscopy Time:  1 minutes 54 seconds Radiation Exposure Index (if provided by the fluoroscopic device): 54 mGy Number of Acquired Spot Images: 0 COMPARISON:  Chest radiographs 07/14/2017 and earlier. FINDINGS: A single contrast study was undertaken and the patient tolerated this well. Contrast freely flowed into the distal 3rd of the thoracic esophagus, but once in the lower esophagus a persistent oval filling defect estimated at 3.9 centimeters in length by 2.4 centimeters with is at outlined by barium. A small volume of thin barium intermittently passes around the filling defect, but a high-grade of obstruction is noted (perhaps 80-90%). See series 9, image 13. Distal to the impaction the gastroesophageal junction and proximal stomach appear unremarkable. The underlying mediastinal contour is normal.  Prior CABG. IMPRESSION: Positive for food impaction in the distal 3rd esophagus. Filling defect estimated at 3.9 x 2.4 cm with subsequent high-grade obstruction. Study discussed by telephone with Carollee Herter in the office of Dr. Bernette Redbird on 02/13/2018 at 1315 hours. Electronically Signed   By: Odessa Fleming M.D.   On: 02/13/2018 13:30    Review of Systems  Constitutional: Negative.   HENT: Negative.   Eyes: Negative.   Respiratory: Negative.   Cardiovascular: Negative.   Gastrointestinal: Positive for nausea.  Genitourinary: Negative.   Musculoskeletal: Negative.   Skin: Negative.    Psychiatric/Behavioral: Negative.     Blood pressure (!) 168/71, pulse 88, temperature 98 F (36.7 C), temperature source Oral, resp. rate 17, height 5\' 8"  (1.727 m), weight 68 kg, SpO2 100 %. Physical Exam  Constitutional: He is oriented to person, place, and time. He appears well-developed and well-nourished.  HENT:  Head: Normocephalic and atraumatic.  Eyes: Conjunctivae are normal.  Neck: Normal range of motion. Neck supple.  Cardiovascular: Normal rate and regular rhythm.  Respiratory: Effort normal and breath sounds normal.  GI: Soft. Bowel sounds are normal.  Neurological: He is alert and oriented to person, place, and time.     Assessment/Plan Food bolus impaction in distal third of esophagus EGD for removal Discussed with patient and his wife at bedside, about the risks(bleeding, perforation) and benefits of the procedure. As the food bolus has been in the esophagus for the fifth day today, there is increased risk of swelling and bleeding. Discussed with patient that with recent barium ingestion, visualization may be challenging, and procedure may be longer than usual. They understand and consent.  Kerin Salen, MD  02/13/2018, 3:02 PM

## 2018-02-13 NOTE — Anesthesia Procedure Notes (Signed)
Procedure Name: Intubation Date/Time: 02/13/2018 3:26 PM Performed by: Bree Heinzelman D, CRNA Pre-anesthesia Checklist: Patient identified, Emergency Drugs available, Suction available and Patient being monitored Patient Re-evaluated:Patient Re-evaluated prior to induction Oxygen Delivery Method: Circle system utilized Preoxygenation: Pre-oxygenation with 100% oxygen Induction Type: IV induction, Rapid sequence and Cricoid Pressure applied Laryngoscope Size: Mac and 4 Grade View: Grade II Tube type: Oral Tube size: 7.5 mm Number of attempts: 1 Airway Equipment and Method: Stylet Placement Confirmation: ETT inserted through vocal cords under direct vision,  positive ETCO2 and breath sounds checked- equal and bilateral Secured at: 22 cm Tube secured with: Tape Dental Injury: Teeth and Oropharynx as per pre-operative assessment

## 2018-02-13 NOTE — Transfer of Care (Signed)
Immediate Anesthesia Transfer of Care Note  Patient: Wayne Mitchell  Procedure(s) Performed: ESOPHAGOGASTRODUODENOSCOPY (EGD) WITH PROPOFOL (N/A ) BIOPSY BALLOON DILATION (N/A ) FOREIGN BODY REMOVAL  Patient Location: PACU  Anesthesia Type:General  Level of Consciousness: awake, alert  and oriented  Airway & Oxygen Therapy: Patient Spontanous Breathing and Patient connected to face mask oxygen  Post-op Assessment: Report given to RN and Post -op Vital signs reviewed and stable  Post vital signs: Reviewed and stable  Last Vitals:  Vitals Value Taken Time  BP    Temp    Pulse    Resp    SpO2      Last Pain:  Vitals:   02/13/18 1416  TempSrc: Oral  PainSc: 0-No pain         Complications: No apparent anesthesia complications

## 2018-02-13 NOTE — Discharge Instructions (Signed)

## 2018-02-13 NOTE — Brief Op Note (Signed)
02/13/2018  3:57 PM  PATIENT:  Wayne Mitchell  83 y.o. male  PRE-OPERATIVE DIAGNOSIS:  foreign body distal esophagus  POST-OPERATIVE DIAGNOSIS:  Foreign body removal, distal esophagus biopsy, mid and proximal biopsy, gastric antrum biopsy, esophageal balloon dilation  PROCEDURE:  Procedure(s): ESOPHAGOGASTRODUODENOSCOPY (EGD) WITH PROPOFOL (N/A) BIOPSY BALLOON DILATION (N/A) FOREIGN BODY REMOVAL  SURGEON:  Surgeon(s) and Role:    Ronnette Juniper, MD - Primary  PHYSICIAN ASSISTANT:   ASSISTANTS: Cleda Daub, RN, Tinnie Gens, Tech  ANESTHESIA:   MAC  EBL:  Minimal  BLOOD ADMINISTERED:none  DRAINS: none   LOCAL MEDICATIONS USED:  NONE  SPECIMEN:  Biopsy / Limited Resection  DISPOSITION OF SPECIMEN:  PATHOLOGY  COUNTS:  YES  TOURNIQUET:  * No tourniquets in log *  DICTATION: .Dragon Dictation  PLAN OF CARE: Discharge to home after PACU  PATIENT DISPOSITION:  PACU - hemodynamically stable.   Delay start of Pharmacological VTE agent (>24hrs) due to surgical blood loss or risk of bleeding: no

## 2018-02-13 NOTE — Op Note (Signed)
[facility] Patient Name: Wayne Mitchell Procedure Date: 02/13/2018 MRN: [id number] Attending MD: Kerin Salen , MD Date of Birth: [birth year] CSN: [id number] Age: 83 Admit Type: Outpatient Procedure:                Upper GI endoscopy Indications:              Foreign body in the esophagus, food bolus stuck in                            distal esophagus, abnormal barium swallow Providers:                Kerin Salen, MD, Dwain Sarna, RN, Zoila Shutter,                            Technician, [facility], CRNA Referring MD:              Medicines:                Monitored Anesthesia Care Complications:            No immediate complications. Estimated blood loss:                            Minimal. Estimated Blood Loss:     Estimated blood loss was minimal. Procedure:                Pre-Anesthesia Assessment:                           - Prior to the procedure, a History and Physical                            was performed, and patient medications and                            allergies were reviewed. The patient's tolerance of                            previous anesthesia was also reviewed. The risks                            and benefits of the procedure and the sedation                            options and risks were discussed with the patient.                            All questions were answered, and informed consent                            was obtained. Prior Anticoagulants: The patient has                            taken aspirin, last dose was 2 days prior to  procedure. ASA Grade Assessment: III - A patient                            with severe systemic disease. After reviewing the                            risks and benefits, the patient was deemed in                            satisfactory condition to undergo the procedure.                           After obtaining informed consent, the endoscope was        passed under direct vision. Throughout the                            procedure, the patient's blood pressure, pulse, and                            oxygen saturations were monitored continuously. The                            GIF-H190 ([id number]) Olympus adult endoscope was                            introduced through the mouth, and advanced to the                            second part of duodenum. The upper GI endoscopy was                            accomplished without difficulty. The patient                            tolerated the procedure well. Scope In: Scope Out: Findings:      Localized mild mucosal changes characterized by inlet patch were found       in the upper third of the esophagus.      Food was found in the distal esophagus. Removal was accomplished with a       basket. Biopsies were obtained from the proximal and distal esophagus       with cold forceps for histology of suspected eosinophilic esophagitis.      There were esophageal mucosal changes consistent with long-segment       Barrett's esophagus present in the lower third of the esophagus. The       maximum longitudinal extent of these mucosal changes was 5 cm in length.       Mucosa was biopsied with a cold forceps for histology in a targeted       manner from 40 to 45 cm from the incisors. One specimen bottle was sent       to pathology.      Localized mildly erythematous mucosa without bleeding was found in the       gastric antrum. Biopsies were taken with a cold forceps for Helicobacter  pylori testing.      The cardia and gastric fundus were normal on retroflexion.      Stomach appeared J shaped and initially it was challenging to advance       the scope into the pylorus, successfully accomplished with some looping.      Few non-bleeding superficial duodenal ulcers with a clean ulcer base       (Forrest Class III) were found in the duodenal bulb and in the first       portion of the duodenum.  The largest lesion was 6 mm in largest       dimension.      A TTS dilator was passed through the scope. Dilation with an 18-19-20 mm       x 8 cm CRE balloon dilator was performed to 20 mm in the distal       esophagus for 2 consecutive minutes.. Impression:               - Biopsies obtained for EOE from mid and proximal                            esophagus.                           - Inlet patch mucosa in the esophagus.                           - Food was found in the esophagus. Removal was                            successful.                           - Esophageal mucosal changes consistent with                            long-segment Barrett's esophagus. Biopsied.                           - Erythematous mucosa in the antrum. Biopsied.                           - Multiple non-bleeding duodenal ulcers with a                            clean ulcer base (Forrest Class III).                           - Dilation performed in the distal esophagus. Moderate Sedation:      Patient did not receive moderate sedation for this procedure, but       instead received monitored anesthesia care. Recommendation:           - Patient has a contact number available for                            emergencies. The signs and symptoms of potential  delayed complications were discussed with the                            patient. Return to normal activities tomorrow.                            Written discharge instructions were provided to the                            patient.                           - Mechanical soft diet.                           - Continue present medications.                           - Await pathology results. Procedure Code(s):        --- Professional ---                           [phone], Esophagogastroduodenoscopy, flexible,                            transoral; with removal of foreign body(s)                           43249,  Esophagogastroduodenoscopy, flexible,                            transoral; with transendoscopic balloon dilation of                            esophagus (less than 30 mm diameter)                           43239, 59, Esophagogastroduodenoscopy, flexible,                            transoral; with biopsy, single or multiple Diagnosis Code(s):        --- Professional ---                           K22.8, Other specified diseases of esophagus                           T18.128A, Food in esophagus causing other injury,                            initial encounter                           K31.89, Other diseases of stomach and duodenum                           K26.9, Duodenal ulcer, unspecified as acute or  chronic, without hemorrhage or perforation                           T18.108A, Unspecified foreign body in esophagus                            causing other injury, initial encounter CPT copyright 2018 American Medical Association. All rights reserved. The codes documented in this report are preliminary and upon coder review may  be revised to meet current compliance requirements. Kerin SalenArya Venisha Boehning, MD 02/13/2018 3:56:24 PM This report has been signed electronically. Number of Addenda: 0

## 2018-02-13 NOTE — Anesthesia Preprocedure Evaluation (Addendum)
Anesthesia Evaluation  Patient identified by MRN, date of birth, ID band Patient awake    Reviewed: Allergy & Precautions, NPO status , Patient's Chart, lab work & pertinent test results, reviewed documented beta blocker date and time   History of Anesthesia Complications Negative for: history of anesthetic complications  Airway Mallampati: II  TM Distance: >3 FB Neck ROM: Full    Dental no notable dental hx. (+) Teeth Intact   Pulmonary neg pulmonary ROS,    Pulmonary exam normal        Cardiovascular hypertension, Pt. on medications and Pt. on home beta blockers + CAD and + CABG  Normal cardiovascular exam  Echo 05/2017 Impressions:  - Technically difficult study. LVEF 60-65%, moderate LVH,   incoordinate septal motion, grade 1 DD, elevated LV filling   pressure, trivial AI, trivial MR, normal LA size.   Neuro/Psych negative neurological ROS  negative psych ROS   GI/Hepatic Neg liver ROS, Impacted food bolus   Endo/Other  Hypothyroidism   Renal/GU negative Renal ROS  negative genitourinary   Musculoskeletal negative musculoskeletal ROS (+)   Abdominal   Peds  Hematology negative hematology ROS (+)   Anesthesia Other Findings   Reproductive/Obstetrics                            Anesthesia Physical Anesthesia Plan  ASA: III  Anesthesia Plan: General   Post-op Pain Management:    Induction: Intravenous, Rapid sequence and Cricoid pressure planned  PONV Risk Score and Plan: 2 and Ondansetron, Dexamethasone and Treatment may vary due to age or medical condition  Airway Management Planned: Oral ETT  Additional Equipment: None  Intra-op Plan:   Post-operative Plan: Extubation in OR  Informed Consent: I have reviewed the patients History and Physical, chart, labs and discussed the procedure including the risks, benefits and alternatives for the proposed anesthesia with the  patient or authorized representative who has indicated his/her understanding and acceptance.   Dental advisory given  Plan Discussed with:   Anesthesia Plan Comments:       Anesthesia Quick Evaluation

## 2018-02-16 ENCOUNTER — Encounter (HOSPITAL_COMMUNITY): Payer: Self-pay | Admitting: Gastroenterology

## 2018-03-11 ENCOUNTER — Other Ambulatory Visit: Payer: Self-pay | Admitting: Gastroenterology

## 2018-03-11 DIAGNOSIS — R1319 Other dysphagia: Secondary | ICD-10-CM

## 2018-03-11 DIAGNOSIS — K269 Duodenal ulcer, unspecified as acute or chronic, without hemorrhage or perforation: Secondary | ICD-10-CM | POA: Diagnosis not present

## 2018-03-11 DIAGNOSIS — R1310 Dysphagia, unspecified: Secondary | ICD-10-CM | POA: Diagnosis not present

## 2018-03-11 DIAGNOSIS — K2270 Barrett's esophagus without dysplasia: Secondary | ICD-10-CM | POA: Diagnosis not present

## 2018-03-11 DIAGNOSIS — R143 Flatulence: Secondary | ICD-10-CM | POA: Diagnosis not present

## 2018-03-18 ENCOUNTER — Other Ambulatory Visit: Payer: Medicare HMO

## 2018-03-20 ENCOUNTER — Ambulatory Visit
Admission: RE | Admit: 2018-03-20 | Discharge: 2018-03-20 | Disposition: A | Discharge status: Home / Self Care | Payer: Medicare HMO | Source: Ambulatory Visit | Attending: Gastroenterology | Admitting: Gastroenterology

## 2018-03-20 DIAGNOSIS — R1319 Other dysphagia: Secondary | ICD-10-CM

## 2018-03-20 DIAGNOSIS — R1310 Dysphagia, unspecified: Secondary | ICD-10-CM

## 2018-03-20 DIAGNOSIS — T18128A Food in esophagus causing other injury, initial encounter: Secondary | ICD-10-CM | POA: Diagnosis not present

## 2018-04-22 DIAGNOSIS — R1310 Dysphagia, unspecified: Secondary | ICD-10-CM | POA: Diagnosis not present

## 2018-04-22 DIAGNOSIS — K269 Duodenal ulcer, unspecified as acute or chronic, without hemorrhage or perforation: Secondary | ICD-10-CM | POA: Diagnosis not present

## 2018-04-22 DIAGNOSIS — R143 Flatulence: Secondary | ICD-10-CM | POA: Diagnosis not present

## 2018-06-15 DIAGNOSIS — R7303 Prediabetes: Secondary | ICD-10-CM | POA: Diagnosis not present

## 2018-06-15 DIAGNOSIS — E7800 Pure hypercholesterolemia, unspecified: Secondary | ICD-10-CM | POA: Diagnosis not present

## 2018-06-15 DIAGNOSIS — N183 Chronic kidney disease, stage 3 (moderate): Secondary | ICD-10-CM | POA: Diagnosis not present

## 2018-06-15 DIAGNOSIS — I779 Disorder of arteries and arterioles, unspecified: Secondary | ICD-10-CM | POA: Diagnosis not present

## 2018-06-15 DIAGNOSIS — I10 Essential (primary) hypertension: Secondary | ICD-10-CM | POA: Diagnosis not present

## 2018-06-15 DIAGNOSIS — K269 Duodenal ulcer, unspecified as acute or chronic, without hemorrhage or perforation: Secondary | ICD-10-CM | POA: Diagnosis not present

## 2018-06-15 DIAGNOSIS — Z1389 Encounter for screening for other disorder: Secondary | ICD-10-CM | POA: Diagnosis not present

## 2018-06-15 DIAGNOSIS — I25810 Atherosclerosis of coronary artery bypass graft(s) without angina pectoris: Secondary | ICD-10-CM | POA: Diagnosis not present

## 2018-06-15 DIAGNOSIS — I700 Atherosclerosis of aorta: Secondary | ICD-10-CM | POA: Diagnosis not present

## 2018-06-15 DIAGNOSIS — K2270 Barrett's esophagus without dysplasia: Secondary | ICD-10-CM | POA: Diagnosis not present

## 2018-06-15 DIAGNOSIS — Z0000 Encounter for general adult medical examination without abnormal findings: Secondary | ICD-10-CM | POA: Diagnosis not present

## 2018-06-15 DIAGNOSIS — D696 Thrombocytopenia, unspecified: Secondary | ICD-10-CM | POA: Diagnosis not present

## 2018-07-24 DIAGNOSIS — H35373 Puckering of macula, bilateral: Secondary | ICD-10-CM | POA: Diagnosis not present

## 2018-07-24 DIAGNOSIS — Z961 Presence of intraocular lens: Secondary | ICD-10-CM | POA: Diagnosis not present

## 2018-07-24 DIAGNOSIS — H59811 Chorioretinal scars after surgery for detachment, right eye: Secondary | ICD-10-CM | POA: Diagnosis not present

## 2018-07-24 DIAGNOSIS — H02401 Unspecified ptosis of right eyelid: Secondary | ICD-10-CM | POA: Diagnosis not present

## 2018-08-18 ENCOUNTER — Telehealth: Payer: Self-pay

## 2018-08-18 NOTE — Telephone Encounter (Signed)
Called pt to offer him a sooner appt with Dr Smith. He is scheduled in August but we have several openings in July.  No answer..LM 

## 2018-08-19 NOTE — Telephone Encounter (Signed)
Returned Pt's call.Marland KitchenMarland KitchenNo answer.Marland Kitchen

## 2018-08-19 NOTE — Telephone Encounter (Signed)
Patient returned a call from April in regards to his upcoming appt

## 2018-09-09 DIAGNOSIS — H5213 Myopia, bilateral: Secondary | ICD-10-CM | POA: Diagnosis not present

## 2018-09-09 DIAGNOSIS — H35372 Puckering of macula, left eye: Secondary | ICD-10-CM | POA: Diagnosis not present

## 2018-09-09 DIAGNOSIS — H26492 Other secondary cataract, left eye: Secondary | ICD-10-CM | POA: Diagnosis not present

## 2018-09-09 DIAGNOSIS — H31003 Unspecified chorioretinal scars, bilateral: Secondary | ICD-10-CM | POA: Diagnosis not present

## 2018-10-06 NOTE — Progress Notes (Addendum)
Cardiology Office Note:    Date:  10/07/2018   ID:  Wayne Mitchell, DOB 1935-05-04, MRN [id number]  PCP:  Wenda Low, MD  Cardiologist:  Sinclair Grooms, MD   Referring MD: Wenda Low, MD   Chief Complaint  Patient presents with  . Coronary Artery Disease  . Hypertension  . Hyperlipidemia    History of Present Illness:    Wayne Mitchell is a 84 y.o. male with a hx of syncope related to dehydration, hyperlipidemia,  hypertension, and CAD with CABG 05/2017.  He is doing well.  He is concerned about his blood pressure.  They (he and Dr. Deforest Hoyles) have made adjustments in medications and he is currently on amlodipine 7.5 mg a day and carvedilol 3.125 mg twice daily.  He denies orthopnea, PND, angina, syncope, and peripheral edema.  He is very active and is having no limitations.  No medication side effects that he is aware of.  Past Medical History:  Diagnosis Date  . Carotid artery disease ([place])   . Colon polyp    small  . Coronary artery disease   . Coronary artery disease involving native coronary artery of native heart with unstable angina pectoris ([place]) 05/30/2017   3V CABG 06/02/17  . HTN (hypertension)   . Hypothyroidism   . Syncope   . Syncope   . Thrombocytopenia ([place])     Past Surgical History:  Procedure Laterality Date  . BALLOON DILATION N/A 02/13/2018   Procedure: BALLOON DILATION;  Surgeon: Ronnette Juniper, MD;  Location: [facility];  Service: Gastroenterology;  Laterality: N/A;  . BIOPSY  02/13/2018   Procedure: BIOPSY;  Surgeon: Ronnette Juniper, MD;  Location: [facility];  Service: Gastroenterology;;  . CATARACT EXTRACTION Left   . CORONARY ARTERY BYPASS GRAFT N/A 06/02/2017   Procedure: CORONARY ARTERY BYPASS GRAFTING (CABG) x3 Using Left Internal Mammary Artery and Right Leg Greater Saphenous Vein harvested endoscopically;  Surgeon: Ivin Poot, MD;  Location: [place];  Service: Open Heart Surgery;  Laterality: N/A;  . ESOPHAGOGASTRODUODENOSCOPY N/A  10/23/2014   Procedure: ESOPHAGOGASTRODUODENOSCOPY (EGD);  Surgeon: Ronald Lobo, MD;  Location: [facility];  Service: Endoscopy;  Laterality: N/A;  . ESOPHAGOGASTRODUODENOSCOPY (EGD) WITH PROPOFOL N/A 02/13/2018   Procedure: ESOPHAGOGASTRODUODENOSCOPY (EGD) WITH PROPOFOL;  Surgeon: Ronnette Juniper, MD;  Location: [facility];  Service: Gastroenterology;  Laterality: N/A;  . FOREIGN BODY REMOVAL  02/13/2018   Procedure: FOREIGN BODY REMOVAL;  Surgeon: Ronnette Juniper, MD;  Location: [facility];  Service: Gastroenterology;;  . Marica Otter EXCHANGE Right 10/24/2017   Procedure: GAS/FLUID EXCHANGE;  Surgeon: Jalene Mullet, MD;  Location: [place];  Service: Ophthalmology;  Laterality: Right;  SF6  . HEMORRHOID SURGERY    . LEFT HEART CATH AND CORONARY ANGIOGRAPHY N/A 05/30/2017   Procedure: LEFT HEART CATH AND CORONARY ANGIOGRAPHY;  Surgeon: Sherren Mocha, MD;  Location: [place] [facility];  Service: Cardiovascular;  Laterality: N/A;  . PARS PLANA VITRECTOMY Right 10/24/2017   Procedure: PARS PLANA VITRECTOMY WITH 25 GAUGE;  Surgeon: Jalene Mullet, MD;  Location: [place];  Service: Ophthalmology;  Laterality: Right;  . PHOTOCOAGULATION Right 10/24/2017   Procedure: PHOTOCOAGULATION;  Surgeon: Jalene Mullet, MD;  Location: [place];  Service: Ophthalmology;  Laterality: Right;  . REFRACTIVE SURGERY Right   . TEE WITHOUT CARDIOVERSION N/A 06/02/2017   Procedure: TRANSESOPHAGEAL ECHOCARDIOGRAM (TEE);  Surgeon: Prescott Gum, Collier Salina, MD;  Location: [place];  Service: Open Heart Surgery;  Laterality: N/A;    Current Medications: Current Meds  Medication  Sig  . acetaminophen (TYLENOL) 325 MG tablet Take 2 tablets (650 mg total) by mouth every 6 (six) hours as needed for mild pain.  Marland Kitchen. amLODipine (NORVASC) 5 MG tablet Take 5 mg by mouth daily. 2.5mg  in the PM  . aspirin EC 81 MG tablet Take 81 mg by mouth See admin instructions. Twice monthly... Pt takes 1 pill on the 1st and 1 pill on the 15th  . atorvastatin (LIPITOR)  20 MG tablet Take 20 mg by mouth daily.  Marland Kitchen. levothyroxine (SYNTHROID, LEVOTHROID) 112 MCG tablet Take 112 mcg by mouth daily before breakfast.  . pantoprazole (PROTONIX) 40 MG tablet Take 40 mg by mouth daily.  . [DISCONTINUED] carvedilol (COREG) 3.125 MG tablet Take 1 tablet (3.125 mg total) by mouth 2 (two) times daily with a meal.     Allergies:   Lisinopril   Social History   Socioeconomic History  . Marital status: Married    Spouse name: Not on file  . Number of children: Not on file  . Years of education: Not on file  . Highest education level: Not on file  Occupational History  . Occupation: retired  Engineer, productionocial Needs  . Financial resource strain: Not on file  . Food insecurity    Worry: Not on file    Inability: Not on file  . Transportation needs    Medical: Not on file    Non-medical: Not on file  Tobacco Use  . Smoking status: Never Smoker  . Smokeless tobacco: Never Used  Substance and Sexual Activity  . Alcohol use: Yes    Alcohol/week: 4.0 standard drinks    Types: 4 Cans of beer per week  . Drug use: No  . Sexual activity: Not on file  Lifestyle  . Physical activity    Days per week: Not on file    Minutes per session: Not on file  . Stress: Not on file  Relationships  . Social Musicianconnections    Talks on phone: Not on file    Gets together: Not on file    Attends religious service: Not on file    Active member of club or organization: Not on file    Attends meetings of clubs or organizations: Not on file    Relationship status: Not on file  Other Topics Concern  . Not on file  Social History Narrative  . Not on file     Family History: The patient's family history includes Healthy in his brother; Pancreatic cancer in his mother.  ROS:   Please see the history of present illness.    Left leg pain occurs and is severe at times when he lies down.  It lasts 1 minute.  Changing position helps facilitate dissipation.Marland Kitchen.  He is concerned that if we give too  many medications that may get his blood pressure too low.  All other systems reviewed and are negative.  EKGs/Labs/Other Studies Reviewed:    The following studies were reviewed today:  Vascular Doppler study May 31, 2017: Final Interpretation: Right Carotid: Velocities in the right ICA are consistent with a 1-39% stenosis.                The ECA appears >50% stenosed.  Left Carotid: Velocities in the left ICA are consistent with a 60-79% stenosis. Vertebrals: Bilateral vertebral arteries demonstrate antegrade flow.  Bilateral Upper Extremity: Radial Doppler waveforms decrease >50% with compression. Ulnar Doppler waveforms remain within normal limits with compression.  2D Doppler echocardiogram April 2019: Study  Conclusions  - Procedure narrative: Transthoracic echocardiography. Image   quality was poor. The study was technically difficult, as a   result of poor acoustic windows and poor sound wave transmission. - Left ventricle: The cavity size was normal. Wall thickness was   increased in a pattern of moderate LVH. Systolic function was   normal. The estimated ejection fraction was in the range of 60%   to 65%. Incoordinate septal motion. Doppler parameters are   consistent with abnormal left ventricular relaxation (grade 1   diastolic dysfunction). The E/e&' ratio is >15, suggesting   elevated LV filling pressure. - Aortic valve: Poorly visualized. There was trivial regurgitation. - Mitral valve: Mildly thickened leaflets . There was trivial   regurgitation. - Left atrium: The atrium was normal in size.  Impressions:  - Technically difficult study. LVEF 60-65%, moderate LVH,   incoordinate septal motion, grade 1 DD, elevated LV filling   pressure, trivial AI, trivial MR, normal LA size.  EKG:  EKG performed on 09/17/2018 demonstrates sinus rhythm, biatrial abnormality, anteroseptal infarction, and a stable when compared to prior tracings.  Recent Labs: 10/24/2017:  BUN 16; Creatinine, Ser 1.10; Hemoglobin 13.2; Platelets 86; Potassium 4.0; Sodium 139  Recent Lipid Panel    Component Value Date/Time   CHOL 130 05/31/2017 0517   TRIG 63 05/31/2017 0517   HDL 56 05/31/2017 0517   CHOLHDL 2.3 05/31/2017 0517   VLDL 13 05/31/2017 0517   LDLCALC 61 05/31/2017 0517    Physical Exam:    VS:  BP (!) 154/72   Pulse 62   Ht 5\' 8"  (1.727 m)   Wt 154 lb 1.9 oz (69.9 kg)   SpO2 97%   BMI 23.43 kg/m     Wt Readings from Last 3 Encounters:  10/07/18 154 lb 1.9 oz (69.9 kg)  02/13/18 150 lb (68 kg)  01/14/18 152 lb (68.9 kg)     GEN: Elderly but healthy-appearing. No acute distress HEENT: Normal NECK: No JVD. LYMPHATICS: No lymphadenopathy CARDIAC:  RRR without murmur, gallop, or edema. VASCULAR: Bilateral carotid bruits.  Normal Pulses. RESPIRATORY:  Clear to auscultation without rales, wheezing or rhonchi  ABDOMEN: Soft, non-tender, non-distended, No pulsatile mass, MUSCULOSKELETAL: No deformity  SKIN: Warm and dry NEUROLOGIC:  Alert and oriented x 3 PSYCHIATRIC:  Normal affect   ASSESSMENT:    1. Coronary artery disease involving coronary bypass graft of native heart with angina pectoris (HCC)   2. PVC's (premature ventricular contractions)   3. Essential (primary) hypertension   4. Hyperlipidemia, unspecified hyperlipidemia type   5. Bilateral carotid artery stenosis   6. Educated About Covid-19 Virus Infection    PLAN:    In order of problems listed above:  1. Secondary prevention is discussed in detail. 2. No significant premature beats. 3. Blood pressure target 130/80 mmHg.  Systolic pressure is not acceptable.  Increase carvedilol to 6.25 mg twice daily.  Call results.  We will except 140 mmHg or less given his age. 4. Last LDL was near target when performed by Dr. Eula Listen last September at 60. 5. He needs to have a carotid Doppler done this year to follow-up on disease identified pre-bypass. 6. Social distancing, mask wearing,  and handwashing is stressed.  Target BP: <130/80 mmHg  Diet and lifestyle measures for BP control were reviewed in detail: Low sodium diet (<2.5 gm daily); alcohol restriction (<3 ounces per day); weight loss (Mediterranean); avoid non-steroidal agents; > 6 hours sleep per day; 150 min moderate exercise per  week. Medical regimen will include at least 2 agents. Resistant hypertension if not controlled on 3 agents. Consider further evaluation: Sleep study to r/o OSA; Renal angiogram; Primary hyperaldonism and Pheochromocytoma w/u. After 3 agents, consider MRA (spironolactone)/ Epleronone), hydralazine, beta-blocker, and Minoxidil if not already in use due to patient profile.    Medication Adjustments/Labs and Tests Ordered: Current medicines are reviewed at length with the patient today.  Concerns regarding medicines are outlined above.  Orders Placed This Encounter  Procedures  . EKG 12-Lead   Meds ordered this encounter  Medications  . carvedilol (COREG) 6.25 MG tablet    Sig: Take 1 tablet (6.25 mg total) by mouth 2 (two) times daily.    Dispense:  180 tablet    Refill:  3    Patient Instructions  Medication Instructions:  1) INCREASE Carvedilol to 6.25mg  twice daily  If you need a refill on your cardiac medications before your next appointment, please call your pharmacy.   Lab work: None If you have labs (blood work) drawn today and your tests are completely normal, you will receive your results only by: Marland Kitchen. MyChart Message (if you have MyChart) OR . A paper copy in the mail If you have any lab test that is abnormal or we need to change your treatment, we will call you to review the results.  Testing/Procedures: None  Follow-Up: At [facility], you and your health needs are our priority.  As part of our continuing mission to provide you with exceptional heart care, we have created designated Provider Care Teams.  These Care Teams include your primary Cardiologist  (physician) and Advanced Practice Providers (APPs -  Physician Assistants and Nurse Practitioners) who all work together to provide you with the care you need, when you need it. You will need a follow up appointment in 12 months.  Please call our office 2 months in advance to schedule this appointment.  You may see Lesleigh NoeHenry W Maxx Calaway III, MD or one of the following Advanced Practice Providers on your designated Care Team:   Norma FredricksonLori Gerhardt, NP Nada BoozerLaura Ingold, NP . Georgie ChardJill McDaniel, NP  Any Other Special Instructions Will Be Listed Below (If Applicable).       Signed, Lesleigh NoeHenry W Deborahann Poteat III, MD  10/07/2018 1:39 PM    [facility]

## 2018-10-07 ENCOUNTER — Ambulatory Visit (INDEPENDENT_AMBULATORY_CARE_PROVIDER_SITE_OTHER): Payer: Medicare HMO | Admitting: Interventional Cardiology

## 2018-10-07 ENCOUNTER — Other Ambulatory Visit: Payer: Self-pay

## 2018-10-07 ENCOUNTER — Encounter: Payer: Self-pay | Admitting: Interventional Cardiology

## 2018-10-07 VITALS — BP 154/72 | HR 62 | Ht 68.0 in | Wt 154.1 lb

## 2018-10-07 DIAGNOSIS — Z7189 Other specified counseling: Secondary | ICD-10-CM

## 2018-10-07 DIAGNOSIS — I493 Ventricular premature depolarization: Secondary | ICD-10-CM | POA: Diagnosis not present

## 2018-10-07 DIAGNOSIS — E785 Hyperlipidemia, unspecified: Secondary | ICD-10-CM | POA: Diagnosis not present

## 2018-10-07 DIAGNOSIS — I6523 Occlusion and stenosis of bilateral carotid arteries: Secondary | ICD-10-CM | POA: Diagnosis not present

## 2018-10-07 DIAGNOSIS — I25709 Atherosclerosis of coronary artery bypass graft(s), unspecified, with unspecified angina pectoris: Secondary | ICD-10-CM

## 2018-10-07 DIAGNOSIS — I10 Essential (primary) hypertension: Secondary | ICD-10-CM

## 2018-10-07 MED ORDER — CARVEDILOL 6.25 MG PO TABS: 6.2500 mg | ORAL_TABLET | Freq: Two times a day (BID) | ORAL | 3 refills | Status: DC

## 2018-10-07 NOTE — Patient Instructions (Signed)
Medication Instructions:  1) INCREASE Carvedilol to 6.25mg  twice daily  If you need a refill on your cardiac medications before your next appointment, please call your pharmacy.   Lab work: None If you have labs (blood work) drawn today and your tests are completely normal, you will receive your results only by: Marland Kitchen MyChart Message (if you have MyChart) OR . A paper copy in the mail If you have any lab test that is abnormal or we need to change your treatment, we will call you to review the results.  Testing/Procedures: None  Follow-Up: At [facility], you and your health needs are our priority.  As part of our continuing mission to provide you with exceptional heart care, we have created designated Provider Care Teams.  These Care Teams include your primary Cardiologist (physician) and Advanced Practice Providers (APPs -  Physician Assistants and Nurse Practitioners) who all work together to provide you with the care you need, when you need it. You will need a follow up appointment in 12 months.  Please call our office 2 months in advance to schedule this appointment.  You may see Sinclair Grooms, MD or one of the following Advanced Practice Providers on your designated Care Team:   Truitt Merle, NP Cecilie Kicks, NP . Kathyrn Drown, NP  Any Other Special Instructions Will Be Listed Below (If Applicable).

## 2018-10-07 NOTE — Addendum Note (Signed)
Addended by: Loren Racer on: 10/07/2018 03:32 PM   Modules accepted: Orders

## 2018-10-15 ENCOUNTER — Ambulatory Visit (HOSPITAL_COMMUNITY)
Admission: RE | Admit: 2018-10-15 | Discharge: 2018-10-15 | Disposition: A | Discharge status: Home / Self Care | Payer: Medicare HMO | Source: Ambulatory Visit | Attending: Cardiovascular Disease | Admitting: Cardiovascular Disease

## 2018-10-15 ENCOUNTER — Other Ambulatory Visit (HOSPITAL_COMMUNITY): Payer: Self-pay | Admitting: Interventional Cardiology

## 2018-10-15 ENCOUNTER — Other Ambulatory Visit: Payer: Self-pay

## 2018-10-15 DIAGNOSIS — I6523 Occlusion and stenosis of bilateral carotid arteries: Secondary | ICD-10-CM

## 2018-10-20 DIAGNOSIS — I10 Essential (primary) hypertension: Secondary | ICD-10-CM | POA: Diagnosis not present

## 2018-10-20 DIAGNOSIS — D696 Thrombocytopenia, unspecified: Secondary | ICD-10-CM | POA: Diagnosis not present

## 2018-10-20 DIAGNOSIS — I779 Disorder of arteries and arterioles, unspecified: Secondary | ICD-10-CM | POA: Diagnosis not present

## 2018-10-20 DIAGNOSIS — N183 Chronic kidney disease, stage 3 (moderate): Secondary | ICD-10-CM | POA: Diagnosis not present

## 2018-10-20 DIAGNOSIS — E7800 Pure hypercholesterolemia, unspecified: Secondary | ICD-10-CM | POA: Diagnosis not present

## 2018-10-20 DIAGNOSIS — R7303 Prediabetes: Secondary | ICD-10-CM | POA: Diagnosis not present

## 2018-10-20 DIAGNOSIS — I700 Atherosclerosis of aorta: Secondary | ICD-10-CM | POA: Diagnosis not present

## 2018-10-20 DIAGNOSIS — K2270 Barrett's esophagus without dysplasia: Secondary | ICD-10-CM | POA: Diagnosis not present

## 2018-10-20 DIAGNOSIS — E039 Hypothyroidism, unspecified: Secondary | ICD-10-CM | POA: Diagnosis not present

## 2018-10-20 DIAGNOSIS — I25810 Atherosclerosis of coronary artery bypass graft(s) without angina pectoris: Secondary | ICD-10-CM | POA: Diagnosis not present

## 2018-11-02 DIAGNOSIS — I10 Essential (primary) hypertension: Secondary | ICD-10-CM | POA: Diagnosis not present

## 2018-11-02 DIAGNOSIS — R7303 Prediabetes: Secondary | ICD-10-CM | POA: Diagnosis not present

## 2018-11-02 DIAGNOSIS — E039 Hypothyroidism, unspecified: Secondary | ICD-10-CM | POA: Diagnosis not present

## 2018-11-02 DIAGNOSIS — E7800 Pure hypercholesterolemia, unspecified: Secondary | ICD-10-CM | POA: Diagnosis not present

## 2018-11-19 ENCOUNTER — Telehealth: Payer: Self-pay | Admitting: Interventional Cardiology

## 2018-11-19 NOTE — Telephone Encounter (Signed)
He needs to have an office visit soon.  He needs an EKG.  He may be in atrial for.

## 2018-11-19 NOTE — Telephone Encounter (Signed)
LMTCB.....  Appt made for the pt 11/20/18 with Dr. Caryl Comes the DOD.Marland Kitchen   LM for the Afib clinic to see if they can see the pt today and if so will cancel the appt with Dr. Caryl Comes..   Need to screen pt with COVID questions.         COVID-19 Pre-Screening Questions:  . In the past 7 to 10 days have you had a cough,  shortness of breath, headache, congestion, fever (100 or greater) body aches, chills, sore throat, or sudden loss of taste or sense of smell? . Have you been around anyone with known Covid 19. . Have you been around anyone who is awaiting Covid 19 test results in the past 7 to 10 days? . Have you been around anyone who has been exposed to Covid 19, or has mentioned symptoms of Covid 19 withn the past 7 to 10 days?   If you have any concerns/questions about symptoms patients report during screening (either on the phone or at threshold). Contact the provider seeing the patient or DOD for further guidance.  If neither are available contact a member of the leadership team.

## 2018-11-19 NOTE — Telephone Encounter (Addendum)
Pt called to report that he has been checking his BP over the last 3 days and has noticed his HR staying between 120 and 128... he says his BP has been consistent at 128-130/70's.Marland Kitchen  He has felt well no palpitations, chest pain,no sob, dizziness or headache... may feel a little light headed but nothing he would worry about if his HR was not elevated..   Pt had his wife check it manually this morning and it was 124.   He denies any changes in his meds.. no OTC meds.. he has not been sick, no sx of infection.   Pt says 2 weeks ago he became very light headed while eating lunch but it did not last long and he has been well since..    I asked him to continue to monitor and to avoid caffeine if possible. To be sure he is drinking enough fluids.  I will forward to Dr. Tamala Julian for his review.Marland Kitchen

## 2018-11-19 NOTE — Telephone Encounter (Signed)
Reviewed with afib clinic and pt should see Dr. Caryl Comes as scheduled tomorrow. I spoke with pt and gave him appointment information. He answers no to all covid 19 prescreening questions below. He is aware to arrive 15 minutes prior to appointment, wear a mask the entire time he is in the building and that no one can accompany him into the building.

## 2018-11-19 NOTE — Telephone Encounter (Signed)
STAT if HR is under 50 or over 120 (normal HR is 60-100 beats per minute)  1) What is your heart rate? 128- it been between 121 and 128 for the last 3 days 2) Do you have a log of your heart rate readings (document readings)? yes   3) Do you have any other symptoms?  no

## 2018-11-20 ENCOUNTER — Other Ambulatory Visit: Payer: Self-pay

## 2018-11-20 ENCOUNTER — Ambulatory Visit: Payer: Medicare HMO | Admitting: Internal Medicine

## 2018-11-20 ENCOUNTER — Encounter: Payer: Self-pay | Admitting: Internal Medicine

## 2018-11-20 VITALS — BP 162/90 | HR 123 | Ht 68.0 in | Wt 161.0 lb

## 2018-11-20 DIAGNOSIS — I4892 Unspecified atrial flutter: Secondary | ICD-10-CM

## 2018-11-20 MED ORDER — DILTIAZEM HCL ER COATED BEADS 120 MG PO CP24: 120.0000 mg | ORAL_CAPSULE | Freq: Every day | ORAL | 3 refills | Status: DC

## 2018-11-20 MED ORDER — APIXABAN 5 MG PO TABS: 5.0000 mg | ORAL_TABLET | Freq: Two times a day (BID) | ORAL | 11 refills | Status: DC

## 2018-11-20 NOTE — Patient Instructions (Signed)
Medication Instructions:  Your physician has recommended you make the following change in your medication:   1. Stop Aspirin 2. Stop Amlodipine 3. Begin Diltiazem, 120mg  capsule, once daily 4. Begin Eliquis, 5mg  tablet, two times daily with food   Labwork: None ordered.  Testing/Procedures: Your physician has recommended that you have an ablation. Catheter ablation is a medical procedure used to treat some cardiac arrhythmias (irregular heartbeats). During catheter ablation, a long, thin, flexible tube is put into a blood vessel in your groin (upper thigh), or neck. This tube is called an ablation catheter. It is then guided to your heart through the blood vessel. Radio frequency waves destroy small areas of heart tissue where abnormal heartbeats may cause an arrhythmia to start. Please see the instruction sheet given to you today.    Follow-Up: Your physician recommends that you schedule a follow-up appointment in:   December 4th for an Atrial Flutter Ablation. I will follow up with pre procedure instructions next week.   Any Other Special Instructions Will Be Listed Below (If Applicable).     If you need a refill on your cardiac medications before your next appointment, please call your pharmacy.

## 2018-11-20 NOTE — Progress Notes (Signed)
ELECTROPHYSIOLOGY CONSULT NOTE  Patient ID: Wayne Mitchell, MRN: [id number], DOB/AGE: 1935/11/06 83 y.o. Admit date: (Not on file) Date of Consult: 11/20/2018  Primary Physician: Georgann Housekeeper, MD Primary Cardiologist: [facility]     Wayne Mitchell is a 83 y.o. male who is being seen today for the evaluation of rapid HR at the request of [facility].    HPI Wayne Mitchell is a 83 y.o. male is seen because of Dr. Michaelle Copas concerned about tachycardia.  He has a history of coronary artery disease for which he underwent bypass surgery 4/19.  History of carotid disease and recurrent syncope.  He has been doing quite well without chest pain or shortness of breath.  He noted his heart rate on his regular blood pressure checks was in the 130 range.  He denies changes in functional status; however, he does report that when he was carrying his golf clubs on Monday with his tachycardia he was very short of breath.  The last few days however, working in the yard, he has had no issues.  Thromboembolic risk factors ( age  -2, HTN-1, Vasc disease -1) for a CHADSVASc Score of >=4   Echocardiogram 4/19 demonstrated normal LV function with moderate LVH  Past Medical History:  Diagnosis Date  . Carotid artery disease (HCC)   . Colon polyp    small  . Coronary artery disease   . Coronary artery disease involving native coronary artery of native heart with unstable angina pectoris (HCC) 05/30/2017   3V CABG 06/02/17  . HTN (hypertension)   . Hypothyroidism   . Syncope   . Syncope   . Thrombocytopenia [facility])       Surgical History:  Past Surgical History:  Procedure Laterality Date  . BALLOON DILATION N/A 02/13/2018   Procedure: BALLOON DILATION;  Surgeon: Kerin Salen, MD;  Location: [facility];  Service: Gastroenterology;  Laterality: N/A;  . BIOPSY  02/13/2018   Procedure: BIOPSY;  Surgeon: Kerin Salen, MD;  Location: [facility] ENDOSCOPY;  Service: Gastroenterology;;  . CATARACT EXTRACTION Left   . CORONARY  ARTERY BYPASS GRAFT N/A 06/02/2017   Procedure: CORONARY ARTERY BYPASS GRAFTING (CABG) x3 Using Left Internal Mammary Artery and Right Leg Greater Saphenous Vein harvested endoscopically;  Surgeon: Kerin Perna, MD;  Location: [facility] OR;  Service: Open Heart Surgery;  Laterality: N/A;  . ESOPHAGOGASTRODUODENOSCOPY N/A 10/23/2014   Procedure: ESOPHAGOGASTRODUODENOSCOPY (EGD);  Surgeon: Bernette Redbird, MD;  Location: [facility] ENDOSCOPY;  Service: Endoscopy;  Laterality: N/A;  . ESOPHAGOGASTRODUODENOSCOPY (EGD) WITH PROPOFOL N/A 02/13/2018   Procedure: ESOPHAGOGASTRODUODENOSCOPY (EGD) WITH PROPOFOL;  Surgeon: Kerin Salen, MD;  Location: [facility] ENDOSCOPY;  Service: Gastroenterology;  Laterality: N/A;  . FOREIGN BODY REMOVAL  02/13/2018   Procedure: FOREIGN BODY REMOVAL;  Surgeon: Kerin Salen, MD;  Location: [facility] ENDOSCOPY;  Service: Gastroenterology;;  . Rafael Bihari EXCHANGE Right 10/24/2017   Procedure: GAS/FLUID EXCHANGE;  Surgeon: Carmela Rima, MD;  Location: [facility] OR;  Service: Ophthalmology;  Laterality: Right;  SF6  . HEMORRHOID SURGERY    . LEFT HEART CATH AND CORONARY ANGIOGRAPHY N/A 05/30/2017   Procedure: LEFT HEART CATH AND CORONARY ANGIOGRAPHY;  Surgeon: [name], MD;  Location: [facility] INVASIVE CV LAB;  Service: Cardiovascular;  Laterality: N/A;  . PARS PLANA VITRECTOMY Right 10/24/2017   Procedure: PARS PLANA VITRECTOMY WITH 25 GAUGE;  Surgeon: Carmela Rima, MD;  Location: [facility] OR;  Service: Ophthalmology;  Laterality: Right;  . PHOTOCOAGULATION Right 10/24/2017   Procedure: PHOTOCOAGULATION;  Surgeon: Carmela Rima, MD;  Location: [facility] OR;  Service: Ophthalmology;  Laterality: Right;  . REFRACTIVE SURGERY Right   . TEE WITHOUT CARDIOVERSION N/A 06/02/2017   Procedure: TRANSESOPHAGEAL ECHOCARDIOGRAM (TEE);  Surgeon: Donata Clay, Theron Arista, MD;  Location: [facility] OR;  Service: Open Heart Surgery;  Laterality: N/A;     Home Meds: Current Meds  Medication Sig  . acetaminophen (TYLENOL) 325 MG tablet Take 2 tablets  (650 mg total) by mouth every 6 (six) hours as needed for mild pain.  Marland Kitchen amLODipine (NORVASC) 5 MG tablet Take 5 mg by mouth daily. 2.5mg  in the PM  . aspirin EC 81 MG tablet Take 81 mg by mouth See admin instructions. Twice monthly... Pt takes 1 pill on the 1st and 1 pill on the 15th  . atorvastatin (LIPITOR) 20 MG tablet Take 20 mg by mouth daily.  . carvedilol (COREG) 6.25 MG tablet Take 1 tablet (6.25 mg total) by mouth 2 (two) times daily.  Marland Kitchen levothyroxine (SYNTHROID, LEVOTHROID) 112 MCG tablet Take 112 mcg by mouth daily before breakfast.  . pantoprazole (PROTONIX) 40 MG tablet Take 40 mg by mouth 2 (two) times daily.     Allergies:  Allergies  Allergen Reactions  . Lisinopril Cough    Cough     Social History   Socioeconomic History  . Marital status: Married    Spouse name: Not on file  . Number of children: Not on file  . Years of education: Not on file  . Highest education level: Not on file  Occupational History  . Occupation: retired  Engineer, production  . Financial resource strain: Not on file  . Food insecurity    Worry: Not on file    Inability: Not on file  . Transportation needs    Medical: Not on file    Non-medical: Not on file  Tobacco Use  . Smoking status: Never Smoker  . Smokeless tobacco: Never Used  Substance and Sexual Activity  . Alcohol use: Yes    Alcohol/week: 4.0 standard drinks    Types: 4 Cans of beer per week  . Drug use: No  . Sexual activity: Not on file  Lifestyle  . Physical activity    Days per week: Not on file    Minutes per session: Not on file  . Stress: Not on file  Relationships  . Social Musician on phone: Not on file    Gets together: Not on file    Attends religious service: Not on file    Active member of club or organization: Not on file    Attends meetings of clubs or organizations: Not on file    Relationship status: Not on file  . Intimate partner violence    Fear of current or ex partner: Not on file     Emotionally abused: Not on file    Physically abused: Not on file    Forced sexual activity: Not on file  Other Topics Concern  . Not on file  Social History Narrative  . Not on file     Family History  Problem Relation Age of Onset  . Pancreatic cancer Mother   . Healthy Brother      ROS:  Please see the history of present illness.     All other systems reviewed and negative.    Physical Exam: Blood pressure (!) 162/90, pulse (!) 123, height 5\' 8"  (1.727 m), weight 161 lb (73 kg), SpO2 96 %. General: Well developed, well nourished male in no  acute distress. Head: Normocephalic, atraumatic, sclera non-icteric, no xanthomas, nares are without discharge. EENT: normal  Lymph Nodes:  none Neck: Negative for carotid bruits. JVD not elevated. Back:without scoliosis kyphosis Lungs: Clear bilaterally to auscultation without wheezes, rales, or rhonchi. Breathing is unlabored. Heart: RRR with S1 S2. No  murmur . No rubs, or gallops appreciated. Abdomen: Soft, non-tender, non-distended with normoactive bowel sounds. No hepatomegaly. No rebound/guarding. No obvious abdominal masses. Msk:  Strength and tone appear normal for age. Extremities: No clubbing or cyanosis. 1+ edema.  Distal pedal pulses are 2+ and equal bilaterally. Skin: Warm and Dry Neuro: Alert and oriented X 3. CN III-XII intact Grossly normal sensory and motor function . Psych:  Responds to questions appropriately with a normal affect.      Labs: Cardiac Enzymes No results for input(s): CKTOTAL, CKMB, TROPONINI in the last 72 hours. CBC Lab Results  Component Value Date   WBC 8.6 10/24/2017   HGB 13.2 10/24/2017   HCT 43.4 10/24/2017   MCV 77.4 (L) 10/24/2017   PLT 86 (L) 10/24/2017   PROTIME: No results for input(s): LABPROT, INR in the last 72 hours. Chemistry No results for input(s): NA, K, CL, CO2, BUN, CREATININE, CALCIUM, PROT, BILITOT, ALKPHOS, ALT, AST, GLUCOSE in the last 168 hours.  Invalid  input(s): LABALBU Lipids Lab Results  Component Value Date   CHOL 130 05/31/2017   HDL 56 05/31/2017   LDLCALC 61 05/31/2017   TRIG 63 05/31/2017   BNP No results found for: PROBNP Thyroid Function Tests: No results for input(s): TSH, T4TOTAL, T3FREE, THYROIDAB in the last 72 hours.  Invalid input(s): FREET3 Miscellaneous No results found for: DDIMER  Radiology/Studies:  No results found.  EKG: atrial flutter 2:1    Assessment and Plan:  ATrial flutter  CHF chronic diastolic\  CAD  S/p CABG   Edema    Pt has new onset atrial flutter-typical.  We have discussed the potential concerns including its contribution to his dyspnea, edema and a thromboembolic risk.  He is not impressed that it has contributed to exercise intolerance apart from when he carried his golf clubs the other day.  Hence, he would like to pursue a course of 3 weeks of anticoagulation followed by a catheter ablation without intervening TEE or cardioversion.  We will stop his aspirin and begin him on Eliquis.  I reviewed the risks and benefits of anticoagulation.  We will stop his amlodipine and begin him on diltiazem 120 mg to see if we can slow his heart rate down.  We will stop his aspirin.  I discussed this with Dr. Marguerite Olea

## 2018-11-24 ENCOUNTER — Telehealth: Payer: Self-pay | Admitting: Interventional Cardiology

## 2018-11-24 DIAGNOSIS — I4892 Unspecified atrial flutter: Secondary | ICD-10-CM

## 2018-11-24 MED ORDER — FUROSEMIDE 20 MG PO TABS: 20.0000 mg | ORAL_TABLET | Freq: Every day | ORAL | 3 refills | Status: DC

## 2018-11-24 NOTE — Telephone Encounter (Signed)
Left message to call back  

## 2018-11-24 NOTE — Telephone Encounter (Signed)
° ° °  Pt c/o swelling: STAT is pt has developed SOB within 24 hours  1) How much weight have you gained and in what time span?   2) If swelling, where is the swelling located? Abdomen  3) Are you currently taking a fluid pill? no  4) Are you currently SOB? yes  5) Do you have a log of your daily weights (if so, list)? 165, 161  6) Have you gained 3 pounds in a day or 5 pounds in a week?   7) Have you traveled recently? no

## 2018-11-24 NOTE — Telephone Encounter (Signed)
He is developing diastolic CHF due to AFl. Start Furosemide 20 mg daily. If fluid build up continues, will need cardioversion prior to ablation.BMET in 7 days. Report whether fluid is decreasing on Thursday. May need higher dose.

## 2018-11-24 NOTE — Telephone Encounter (Signed)
Pt came in to see Dr. Caryl Comes on Friday as a DOD appt.  Weight at appt here was 161lbs, which matched his home scale.  Today is weight is 165lbs and feels like his abdomen is tight.  Says he feels more SOB when up walking a lot or if he bends over.  Occasional dizziness.  Denies any other sx.  Denies any increased salt in his diet.  Not currently taking a diuretic.  Vitals the last two days were 144/65. HR 116 and 134/80, HR 122.  Advised I will send to Dr. Tamala Julian for review.  Pt appreciative for call.

## 2018-11-24 NOTE — Telephone Encounter (Signed)
Spoke with pt and went over recommendations per Dr. Tamala Julian.  Pt will have labs done 10/21.  Pt verbalized understanding and was in agreement with this plan.

## 2018-11-26 DIAGNOSIS — Z23 Encounter for immunization: Secondary | ICD-10-CM | POA: Diagnosis not present

## 2018-12-01 ENCOUNTER — Telehealth: Payer: Self-pay

## 2018-12-01 DIAGNOSIS — Z01812 Encounter for preprocedural laboratory examination: Secondary | ICD-10-CM

## 2018-12-01 DIAGNOSIS — I4892 Unspecified atrial flutter: Secondary | ICD-10-CM

## 2018-12-01 NOTE — Telephone Encounter (Signed)
LVM for pt. He will need a DCCV scheduled for the 1st week in November per Dr. Caryl Comes. Pre procedure instructions and COVID screening instructions will also need to be discussed and arranged.

## 2018-12-01 NOTE — Telephone Encounter (Signed)
Follow Up:     Pt is retuning your call from today.

## 2018-12-02 ENCOUNTER — Other Ambulatory Visit: Payer: Medicare HMO

## 2018-12-02 ENCOUNTER — Other Ambulatory Visit: Payer: Self-pay

## 2018-12-02 ENCOUNTER — Encounter: Payer: Self-pay | Admitting: *Deleted

## 2018-12-02 ENCOUNTER — Telehealth: Payer: Self-pay | Admitting: Internal Medicine

## 2018-12-02 DIAGNOSIS — Z01812 Encounter for preprocedural laboratory examination: Secondary | ICD-10-CM

## 2018-12-02 DIAGNOSIS — I4892 Unspecified atrial flutter: Secondary | ICD-10-CM

## 2018-12-02 NOTE — Telephone Encounter (Signed)
See previous phone note.  

## 2018-12-02 NOTE — Telephone Encounter (Signed)
New message:    Patient returning your call back. Patient also states that he will be in today 11:15 am for lab. Please call patient before then.

## 2018-12-02 NOTE — Telephone Encounter (Signed)
I spoke with pt and gave him information from Dr Caryl Comes regarding cardioversion. Pt would like to proceed.  He is working [employer] on November 3 and cannot cancel this.  Will plan on Cardioversion for 11/9 and COVID screening on 11/5.   Cardioversion arranged for 8:00 on 11/9 with Dr Geralyn Corwin. (case number [id number]) CBC added to today's labs.  I spoke with pt and verbally went over instructions with him. Will send through my chart and also leave printed copy downstairs for him to pick up when he comes in for lab work today. Covid testing is November 5 at 1:30

## 2018-12-03 LAB — CBC
Hematocrit: 36.5 % — ABNORMAL LOW (ref 37.5–51.0)
Hemoglobin: 12.8 g/dL — ABNORMAL LOW (ref 13.0–17.7)
MCH: 30.5 pg (ref 26.6–33.0)
MCHC: 35.1 g/dL (ref 31.5–35.7)
MCV: 87 fL (ref 79–97)
Platelets: 93 10*3/uL — CL (ref 150–450)
RBC: 4.2 x10E6/uL (ref 4.14–5.80)
RDW: 13.1 % (ref 11.6–15.4)
WBC: 6.1 10*3/uL (ref 3.4–10.8)

## 2018-12-03 LAB — BASIC METABOLIC PANEL
BUN/Creatinine Ratio: 10 (ref 10–24)
BUN: 13 mg/dL (ref 8–27)
CO2: 23 mmol/L (ref 20–29)
Calcium: 8.9 mg/dL (ref 8.6–10.2)
Chloride: 99 mmol/L (ref 96–106)
Creatinine, Ser: 1.26 mg/dL (ref 0.76–1.27)
GFR calc Af Amer: 62 mL/min/{1.73_m2} (ref >59)
GFR calc non Af Amer: 54 mL/min/{1.73_m2} — ABNORMAL LOW (ref >59)
Glucose: 91 mg/dL (ref 65–99)
Potassium: 3.7 mmol/L (ref 3.5–5.2)
Sodium: 138 mmol/L (ref 134–144)

## 2018-12-07 ENCOUNTER — Telehealth: Payer: Self-pay

## 2018-12-07 NOTE — Telephone Encounter (Signed)
I spoke to the patient's wife and answered her questions regarding the upcoming Cardioversion the patient is having in early November.  She verbalized understanding and was thankful for the call.

## 2018-12-14 ENCOUNTER — Encounter: Payer: Self-pay | Admitting: Interventional Cardiology

## 2018-12-14 NOTE — Telephone Encounter (Signed)
No encounter needed

## 2018-12-17 ENCOUNTER — Other Ambulatory Visit (HOSPITAL_COMMUNITY)
Admission: RE | Admit: 2018-12-17 | Discharge: 2018-12-17 | Disposition: A | Discharge status: Home / Self Care | Payer: Medicare HMO | Source: Ambulatory Visit | Attending: Internal Medicine | Admitting: Internal Medicine

## 2018-12-17 DIAGNOSIS — Z01812 Encounter for preprocedural laboratory examination: Secondary | ICD-10-CM | POA: Diagnosis not present

## 2018-12-17 DIAGNOSIS — Z20828 Contact with and (suspected) exposure to other viral communicable diseases: Secondary | ICD-10-CM | POA: Diagnosis not present

## 2018-12-18 LAB — NOVEL CORONAVIRUS, NAA (HOSP ORDER, SEND-OUT TO REF LAB; TAT 18-24 HRS): SARS-CoV-2, NAA: NOT DETECTED

## 2018-12-21 ENCOUNTER — Encounter (HOSPITAL_COMMUNITY)
Admission: RE | Disposition: A | Discharge status: Home / Self Care | Payer: Self-pay | Source: Home / Self Care | Attending: Internal Medicine

## 2018-12-21 ENCOUNTER — Ambulatory Visit (HOSPITAL_COMMUNITY): Payer: Medicare HMO | Admitting: Certified Registered Nurse Anesthetist

## 2018-12-21 ENCOUNTER — Other Ambulatory Visit: Payer: Self-pay

## 2018-12-21 ENCOUNTER — Ambulatory Visit (HOSPITAL_COMMUNITY)
Admission: RE | Admit: 2018-12-21 | Discharge: 2018-12-21 | Disposition: A | Discharge status: Home / Self Care | Payer: Medicare HMO | Attending: Internal Medicine | Admitting: Internal Medicine

## 2018-12-21 ENCOUNTER — Encounter (HOSPITAL_COMMUNITY): Payer: Self-pay

## 2018-12-21 DIAGNOSIS — I5032 Chronic diastolic (congestive) heart failure: Secondary | ICD-10-CM | POA: Insufficient documentation

## 2018-12-21 DIAGNOSIS — E785 Hyperlipidemia, unspecified: Secondary | ICD-10-CM | POA: Diagnosis not present

## 2018-12-21 DIAGNOSIS — Z79899 Other long term (current) drug therapy: Secondary | ICD-10-CM | POA: Diagnosis not present

## 2018-12-21 DIAGNOSIS — Z7982 Long term (current) use of aspirin: Secondary | ICD-10-CM | POA: Diagnosis not present

## 2018-12-21 DIAGNOSIS — I110 Hypertensive heart disease with heart failure: Secondary | ICD-10-CM | POA: Insufficient documentation

## 2018-12-21 DIAGNOSIS — I483 Typical atrial flutter: Secondary | ICD-10-CM

## 2018-12-21 DIAGNOSIS — Z951 Presence of aortocoronary bypass graft: Secondary | ICD-10-CM | POA: Insufficient documentation

## 2018-12-21 DIAGNOSIS — E039 Hypothyroidism, unspecified: Secondary | ICD-10-CM | POA: Diagnosis not present

## 2018-12-21 DIAGNOSIS — I25110 Atherosclerotic heart disease of native coronary artery with unstable angina pectoris: Secondary | ICD-10-CM | POA: Insufficient documentation

## 2018-12-21 DIAGNOSIS — Z79890 Hormone replacement therapy (postmenopausal): Secondary | ICD-10-CM | POA: Diagnosis not present

## 2018-12-21 DIAGNOSIS — I4892 Unspecified atrial flutter: Secondary | ICD-10-CM | POA: Insufficient documentation

## 2018-12-21 DIAGNOSIS — I10 Essential (primary) hypertension: Secondary | ICD-10-CM | POA: Diagnosis not present

## 2018-12-21 HISTORY — PX: CARDIOVERSION: SHX1299

## 2018-12-21 LAB — POCT I-STAT, CHEM 8
BUN: 13 mg/dL (ref 8–23)
Calcium, Ion: 1.14 mmol/L — ABNORMAL LOW (ref 1.15–1.40)
Chloride: 103 mmol/L (ref 98–111)
Creatinine, Ser: 1.3 mg/dL — ABNORMAL HIGH (ref 0.61–1.24)
Glucose, Bld: 102 mg/dL — ABNORMAL HIGH (ref 70–99)
HCT: 39 % (ref 39.0–52.0)
Hemoglobin: 13.3 g/dL (ref 13.0–17.0)
Potassium: 3.6 mmol/L (ref 3.5–5.1)
Sodium: 142 mmol/L (ref 135–145)
TCO2: 25 mmol/L (ref 22–32)

## 2018-12-21 SURGERY — CARDIOVERSION
Anesthesia: General

## 2018-12-21 MED ORDER — PROPOFOL 10 MG/ML IV BOLUS
INTRAVENOUS | Status: DC | PRN
  Administered 2018-12-21: 60 mg via INTRAVENOUS

## 2018-12-21 MED ORDER — SODIUM CHLORIDE 0.9 % IV SOLN
INTRAVENOUS | Status: DC | PRN
  Administered 2018-12-21: 08:00:00 via INTRAVENOUS

## 2018-12-21 MED ORDER — LIDOCAINE 2% (20 MG/ML) 5 ML SYRINGE
INTRAMUSCULAR | Status: DC | PRN
Start: 2018-12-21 — End: 2018-12-21
  Administered 2018-12-21: 60 mg via INTRAVENOUS

## 2018-12-21 MED ORDER — CARVEDILOL 6.25 MG PO TABS: 3.1250 mg | ORAL_TABLET | Freq: Two times a day (BID) | ORAL | 3 refills | Status: DC

## 2018-12-21 MED ORDER — ATROPINE SULFATE 0.4 MG/ML IJ SOLN
INTRAMUSCULAR | Status: DC | PRN
  Administered 2018-12-21 (×2): 0.4 mg via INTRAVENOUS

## 2018-12-21 MED ORDER — EPHEDRINE SULFATE 50 MG/ML IJ SOLN
INTRAMUSCULAR | Status: DC | PRN
  Administered 2018-12-21: 5 mg via INTRAVENOUS
  Administered 2018-12-21: 10 mg via INTRAVENOUS

## 2018-12-21 NOTE — CV Procedure (Signed)
   CARDIOVERSION NOTE  Procedure: Electrical Cardioversion Indications:  Atrial Flutter  Procedure Details:  Consent: Risks of procedure as well as the alternatives and risks of each were explained to the (patient/caregiver).  Consent for procedure obtained.  Time Out: Verified patient identification, verified procedure, site/side was marked, verified correct patient position, special equipment/implants available, medications/allergies/relevent history reviewed, required imaging and test results available.  Performed  Patient placed on cardiac monitor, pulse oximetry, supplemental oxygen as necessary.  Sedation given: Propofol per anesthesia Pacer pads placed anterior and posterior chest.  Cardioverted 1 time(s).  Cardioverted at 150J biphasic.  Impression: Findings: Post procedure EKG shows: Initially marked junctional bradycardia, then alternating sinus bradycardia and junctional bradycardia Complications: marked bradycardia - given 0.8 mg atropine and 10 mcg ephedrine by anesthesia Patient did tolerate procedure well.  Plan: 1. Successful DCCV to junctional/sinus bradycardia 2. Decrease coreg to 3.125 (1/2) tablet BID, starting tomorrow am (11/10) - DO NOT take dose this pm 3.   Follow-up with Dr. Tamala Julian and Dr. Caryl Comes  Time Spent Directly with the Patient:  30 minutes   Pixie Casino, MD, [facility], [place] Director of the Advanced Lipid Disorders &  Cardiovascular Risk Reduction Clinic Diplomate of the American Board of Clinical Lipidology Attending Cardiologist  Direct Dial: [phone]  Fax: [phone]  Website:  [email or web address] 12/21/2018, 8:27 AM

## 2018-12-21 NOTE — Anesthesia Postprocedure Evaluation (Signed)
Anesthesia Post Note  Patient: Wayne Mitchell  Procedure(s) Performed: CARDIOVERSION (N/A )     Patient location during evaluation: Endoscopy Anesthesia Type: General Level of consciousness: awake Pain management: pain level controlled Vital Signs Assessment: post-procedure vital signs reviewed and stable Respiratory status: spontaneous breathing Cardiovascular status: stable Postop Assessment: no apparent nausea or vomiting Anesthetic complications: no    Last Vitals:  Vitals:   12/21/18 0845 12/21/18 0855  BP: (!) 130/51 (!) 153/76  Pulse: (!) 51 (!) 49  Resp: 19 19  Temp:    SpO2: 93% 95%    Last Pain:  Vitals:   12/21/18 0855  TempSrc:   PainSc: 0-No pain                 Diara Chaudhari

## 2018-12-21 NOTE — Transfer of Care (Signed)
Immediate Anesthesia Transfer of Care Note  Patient: Wayne Mitchell  Procedure(s) Performed: CARDIOVERSION (N/A )  Patient Location: Endoscopy Unit  Anesthesia Type:General  Level of Consciousness: awake, alert , patient cooperative and responds to stimulation  Airway & Oxygen Therapy: Patient Spontanous Breathing and Patient connected to nasal cannula oxygen  Post-op Assessment: Report given to RN and Post -op Vital signs reviewed and stable  Post vital signs: Reviewed and stable  Last Vitals:  Vitals Value Taken Time  BP 131/70 12/21/18 0818  Temp 36.6 C 12/21/18 0818  Pulse 71 12/21/18 0818  Resp 13 12/21/18 0818  SpO2 95 % 12/21/18 0818    Last Pain:  Vitals:   12/21/18 0818  TempSrc: Oral  PainSc: 0-No pain         Complications: No apparent anesthesia complications

## 2018-12-21 NOTE — Anesthesia Preprocedure Evaluation (Signed)
Anesthesia Evaluation  Patient identified by MRN, date of birth, ID band Patient awake    Reviewed: Allergy & Precautions, NPO status , Patient's Chart, lab work & pertinent test results  Airway Mallampati: II  TM Distance: >3 FB     Dental   Pulmonary pneumonia,    breath sounds clear to auscultation       Cardiovascular hypertension, + angina + CAD   Rhythm:Regular Rate:Normal     Neuro/Psych  Headaches,    GI/Hepatic   Endo/Other    Renal/GU      Musculoskeletal   Abdominal   Peds  Hematology   Anesthesia Other Findings   Reproductive/Obstetrics                             Anesthesia Physical Anesthesia Plan  ASA: III  Anesthesia Plan: General   Post-op Pain Management:    Induction: Intravenous  PONV Risk Score and Plan: Propofol infusion  Airway Management Planned: Simple Face Mask and Nasal Cannula  Additional Equipment:   Intra-op Plan:   Post-operative Plan:   Informed Consent:   Plan Discussed with: Anesthesiologist  Anesthesia Plan Comments:         Anesthesia Quick Evaluation

## 2018-12-21 NOTE — Discharge Instructions (Signed)

## 2018-12-21 NOTE — H&P (Signed)
   INTERVAL PROCEDURE H&P  History and Physical Interval Note:  12/21/2018 7:52 AM  Wayne Mitchell has presented today for their planned procedure. The various methods of treatment have been discussed with the patient and family. After consideration of risks, benefits and other options for treatment, the patient has consented to the procedure.  The patients' outpatient history has been reviewed, patient examined, and no change in status from most recent office note within the past 30 days (11/20/2018). I have reviewed the patients' chart and labs and will proceed as planned. Questions were answered to the patient's satisfaction.   Pixie Casino, MD, [facility], [place] Director of the Advanced Lipid Disorders &  Cardiovascular Risk Reduction Clinic Diplomate of the American Board of Clinical Lipidology Attending Cardiologist  Direct Dial: [phone]  Fax: [phone]  Website:  [email or web address] 12/21/2018, 7:52 AM

## 2018-12-22 ENCOUNTER — Encounter (HOSPITAL_COMMUNITY): Payer: Self-pay | Admitting: Internal Medicine

## 2018-12-27 ENCOUNTER — Other Ambulatory Visit: Payer: Self-pay | Admitting: Internal Medicine

## 2018-12-28 DIAGNOSIS — I4892 Unspecified atrial flutter: Secondary | ICD-10-CM

## 2018-12-28 NOTE — Telephone Encounter (Signed)
Pre Flutter procedure instructions reviewed with pt today. Letter sent via [facility] [facility]. Pt has agreed and verbalized understanding. He had no additional questions.

## 2019-01-06 ENCOUNTER — Ambulatory Visit: Payer: Medicare HMO | Admitting: Internal Medicine

## 2019-01-06 ENCOUNTER — Encounter: Payer: Self-pay | Admitting: Internal Medicine

## 2019-01-06 ENCOUNTER — Other Ambulatory Visit: Payer: Self-pay

## 2019-01-06 ENCOUNTER — Telehealth: Payer: Self-pay | Admitting: Internal Medicine

## 2019-01-06 VITALS — BP 160/94 | HR 124 | Ht 68.0 in | Wt 154.4 lb

## 2019-01-06 DIAGNOSIS — I4892 Unspecified atrial flutter: Secondary | ICD-10-CM | POA: Diagnosis not present

## 2019-01-06 MED ORDER — DILTIAZEM HCL ER COATED BEADS 120 MG PO CP24: 120.0000 mg | ORAL_CAPSULE | Freq: Two times a day (BID) | ORAL | 3 refills | Status: DC

## 2019-01-06 NOTE — Progress Notes (Signed)
Patient Care Team: Georgann Housekeeper, MD as PCP - General (Internal Medicine) Lyn Records, MD as PCP - Cardiology (Cardiology)   HPI  Wayne Mitchell is a 83 y.o. male seen in followup for atrial flutter for which RFCA is anticipated next week He underwent preablation DCCV but 3 days ago he noted that his heart rate instead of being in the 70s was in the 120s.  Date Cr K Hgb  11/20 1.30 3.6 13.3         No complaints of chest pain, shortness of breath, lightheadedness.  He does have some palpitations.  History of coronary artery disease for which he underwent bypass surgery 4/19.  History of carotid disease and recurrent syncope.  Thromboembolic risk factors ( age  -2, HTN-1, Vasc disease -1) for a CHADSVASc Score of >=4   Echocardiogram 4/19 demonstrated normal LV function with moderate LVH  Records and Results Reviewed   Past Medical History:  Diagnosis Date  . Carotid artery disease (HCC)   . Colon polyp    small  . Coronary artery disease   . Coronary artery disease involving native coronary artery of native heart with unstable angina pectoris (HCC) 05/30/2017   3V CABG 06/02/17  . HTN (hypertension)   . Hypothyroidism   . Syncope   . Syncope   . Thrombocytopenia (HCC)     Past Surgical History:  Procedure Laterality Date  . BALLOON DILATION N/A 02/13/2018   Procedure: BALLOON DILATION;  Surgeon: Kerin Salen, MD;  Location: [facility] ENDOSCOPY;  Service: Gastroenterology;  Laterality: N/A;  . BIOPSY  02/13/2018   Procedure: BIOPSY;  Surgeon: Kerin Salen, MD;  Location: [facility]OS[facility];  Service: Gastroenterology;;  . CARDIOVERSION N/A 12/21/2018   Procedure: CARDIOVERSION;  Surgeon: Chrystie Nose, MD;  Location: [facility] ENDOSCOPY;  Service: Cardiovascular;  Laterality: N/A;  . CATARACT EXTRACTION Left   . CORONARY ARTERY BYPASS GRAFT N/A 06/02/2017   Procedure: CORONARY ARTERY BYPASS GRAFTING (CABG) x3 Using Left Internal Mammary Artery and Right Leg Greater Saphenous  Vein harvested endoscopically;  Surgeon: Kerin Perna, MD;  Location: [facility] OR;  Service: Open Heart Surgery;  Laterality: N/A;  . ESOPHAGOGASTRODUODENOSCOPY N/A 10/23/2014   Procedure: ESOPHAGOGASTRODUODENOSCOPY (EGD);  Surgeon: Bernette Redbird, MD;  Location[facility] ENDOSCOPY;  Service: Endoscopy;  Laterality: N/A;  . ESOPHAGOGASTRODUODENOSCOPY (EGD) WITH PROPOFOL N/A 02/13/2018   Procedure: ESOPHAGOGASTRODUODENOSCOPY (EGD) WITH PROPOFOL;  Surgeon: Kerin Salen, MD;  Location: [facility] ENDOSCOPY;  Service: Gastroenterology;  Laterality: N/A;  . FOREIGN BODY REMOVAL  02/13/2018   Procedure: FOREIGN BODY REMOVAL;  Surgeon: Kerin Salen, MD;  Location: [facility] ENDOSCOPY;  Service: Gastroenterology;;  . Rafael Bihari EXCHANGE Right 10/24/2017   Procedure: GAS/FLUID EXCHANGE;  Surgeon: Carmela Rima, MD;  Location: [facility] OR;  Service: Ophthalmology;  Laterality: Right;  SF6  . HEMORRHOID SURGERY    . LEFT HEART CATH AND CORONARY ANGIOGRAPHY N/A 05/30/2017   Procedure: LEFT HEART CATH AND CORONARY ANGIOGRAPHY;  Surgeon: [name], MD;  Location: [facility] INVASIVE CV LAB;  Service: Cardiovascular;  Laterality: N/A;  . PARS PLANA VITRECTOMY Right 10/24/2017   Procedure: PARS PLANA VITRECTOMY WITH 25 GAUGE;  Surgeon: Carmela Rima, MD;  Location: [facility] OR;  Service: Ophthalmology;  Laterality: Right;  . PHOTOCOAGULATION Right 10/24/2017   Procedure: PHOTOCOAGULATION;  Surgeon: Carmela Rima, MD;  Location: [facility] OR;  Service: Ophthalmology;  Laterality: Right;  . REFRACTIVE SURGERY Right   . TEE WITHOUT CARDIOVERSION N/A 06/02/2017   Procedure: TRANSESOPHAGEAL ECHOCARDIOGRAM (TEE);  Surgeon: Zenaida Niece  Donney Rankins, MD;  Location: [place];  Service: Open Heart Surgery;  Laterality: N/A;    Current Meds  Medication Sig  . apixaban (ELIQUIS) 5 MG TABS tablet Take 1 tablet (5 mg total) by mouth 2 (two) times daily.  Marland Kitchen atorvastatin (LIPITOR) 20 MG tablet Take 20 mg by mouth daily.  . carvedilol (COREG) 6.25 MG tablet Take 0.5 tablets (3.125 mg  total) by mouth 2 (two) times daily.  Marland Kitchen diltiazem (CARDIZEM CD) 120 MG 24 hr capsule Take 1 capsule (120 mg total) by mouth daily.  . furosemide (LASIX) 20 MG tablet Take 1 tablet (20 mg total) by mouth daily.  Marland Kitchen levothyroxine (SYNTHROID, LEVOTHROID) 112 MCG tablet Take 112 mcg by mouth daily before breakfast.  . pantoprazole (PROTONIX) 40 MG tablet Take 40 mg by mouth 2 (two) times daily.     Allergies  Allergen Reactions  . Lisinopril Cough      Review of Systems negative except from HPI and PMH  Physical Exam BP (!) 160/94   Pulse (!) 124   Ht 5\' 8"  (1.727 m)   Wt 154 lb 6.4 oz (70 kg)   SpO2 97%   BMI 23.48 kg/m  Well developed and well nourished in no acute distress HENT normal E scleral and icterus clear Neck Supple JVP flat; carotids brisk and full Clear to ausculation Rapid but regular rate and rhythm, no murmurs gallops or rub Soft with active bowel sounds No clubbing cyanosis  Edema Alert and oriented, grossly normal motor and sensory function Skin Warm and Dry  ECG atrial flutter at 124 2:1  AVBlock RBBB RAD  Personally reviewed   Unchanged from 10/20  Assessment and  Plan Atrial flutter recurrent   Hypertension  CHF chronic diastolic  CAD  S/p CABG   RBBB/RAD   Patient has recurrent atrial flutter following cardioversion.  Thankfully he has scant symptoms  If they worsen I have advised that he go to ER for DCCV and will plan to proceed with RFCA next Friday  12/4 as scheduled. Will have him stop his dilt and carvedilol the night before as he had post DCCV bradycardia  Reviewed procedure       Current medicines are reviewed at length with the patient today .  The patient does not  have concerns regarding medicines.

## 2019-01-06 NOTE — Patient Instructions (Addendum)
Medication Instructions:  Your physician has recommended you make the following change in your medication:   1. INCREASE: diltiazem (cardizem CD) 120 mg tablet: Take 1 tablet by mouth TWICE a day  2. On Saturday 02/72/53, if your Systolic Blood Pressure (top number) is greater than 130 AND your Heart Rate is still elevated, then you may increase your carvedilol to 6.25 mg twice a day  3. Next Thursday 01/14/19, take your night dose of Eliquis. Do not take your morning dose of ELiquis on Friday 01/15/19  Lab work: TODAY (previously ordered)  If you have labs (blood work) drawn today and your tests are completely normal, you will receive your results only by: Marland Kitchen MyChart Message (if you have MyChart) OR . A paper copy in the mail If you have any lab test that is abnormal or we need to change your treatment, we will call you to review the results.  Testing/Procedures: None ordered  Follow-Up: . Keep follow up on 02/23/19 at 3:00 PM with Dr. Caryl Comes  Any Other Special Instructions Will Be Listed Below (If Applicable).

## 2019-01-06 NOTE — Telephone Encounter (Signed)
Patient complaining of elevated HR for the last 4 days that will not go down. Patient stated he is not having any other symptoms. Since patient is having an ablation next week with Dr. Caryl Comes and Dr. Caryl Comes has some openings today. Scheduled patient to see Dr. Caryl Comes today. Patient agreed to plan.

## 2019-01-06 NOTE — Telephone Encounter (Signed)
Pt c/o BP issue: STAT if pt c/o blurred vision, one-sided weakness or slurred speech  1. What are your last 5 BP readings? Low 130's and High 150's  2. Are you having any other symptoms (ex. Dizziness, headache, blurred vision, passed out)? No, everything seems to be okay. Last week he does state that he did feel tired.   3. What is your BP issue? BP is jumping all over the place. Has surgery coming up and just wants to be cautious.   STAT if HR is under 50 or over 120 (normal HR is 60-100 beats per minute)  1) What is your heart rate? 124  2) Do you have a log of your heart rate readings (document readings)? 124, 126, 125, 115  3) Do you have any other symptoms? No

## 2019-01-07 LAB — CBC
Hematocrit: 40.2 % (ref 37.5–51.0)
Hemoglobin: 13.6 g/dL (ref 13.0–17.7)
MCH: 28.9 pg (ref 26.6–33.0)
MCHC: 33.8 g/dL (ref 31.5–35.7)
MCV: 86 fL (ref 79–97)
Platelets: 98 10*3/uL — CL (ref 150–450)
RBC: 4.7 x10E6/uL (ref 4.14–5.80)
RDW: 13 % (ref 11.6–15.4)
WBC: 6.9 10*3/uL (ref 3.4–10.8)

## 2019-01-07 LAB — BASIC METABOLIC PANEL
BUN/Creatinine Ratio: 13 (ref 10–24)
BUN: 17 mg/dL (ref 8–27)
CO2: 26 mmol/L (ref 20–29)
Calcium: 9.3 mg/dL (ref 8.6–10.2)
Chloride: 99 mmol/L (ref 96–106)
Creatinine, Ser: 1.34 mg/dL — ABNORMAL HIGH (ref 0.76–1.27)
GFR calc Af Amer: 57 mL/min/{1.73_m2} — ABNORMAL LOW (ref >59)
GFR calc non Af Amer: 49 mL/min/{1.73_m2} — ABNORMAL LOW (ref >59)
Glucose: 96 mg/dL (ref 65–99)
Potassium: 4.1 mmol/L (ref 3.5–5.2)
Sodium: 140 mmol/L (ref 134–144)

## 2019-01-12 ENCOUNTER — Other Ambulatory Visit: Payer: Medicare HMO

## 2019-01-12 ENCOUNTER — Other Ambulatory Visit (HOSPITAL_COMMUNITY)
Admission: RE | Admit: 2019-01-12 | Discharge: 2019-01-12 | Disposition: A | Discharge status: Home / Self Care | Payer: Medicare HMO | Source: Ambulatory Visit | Attending: Internal Medicine | Admitting: Internal Medicine

## 2019-01-12 DIAGNOSIS — Z20828 Contact with and (suspected) exposure to other viral communicable diseases: Secondary | ICD-10-CM | POA: Insufficient documentation

## 2019-01-12 DIAGNOSIS — Z01812 Encounter for preprocedural laboratory examination: Secondary | ICD-10-CM | POA: Diagnosis not present

## 2019-01-14 LAB — NOVEL CORONAVIRUS, NAA (HOSP ORDER, SEND-OUT TO REF LAB; TAT 18-24 HRS): SARS-CoV-2, NAA: NOT DETECTED

## 2019-01-14 NOTE — Anesthesia Preprocedure Evaluation (Addendum)
Anesthesia Evaluation  Patient identified by MRN, date of birth, ID band Patient awake    Reviewed: Allergy & Precautions, NPO status , Patient's Chart, lab work & pertinent test results  Airway Mallampati: II  TM Distance: >3 FB     Dental no notable dental hx. (+) Dental Advisory Given   Pulmonary    breath sounds clear to auscultation       Cardiovascular hypertension, Pt. on medications and Pt. on home beta blockers + angina + CAD and + CABG  + dysrhythmias Atrial Fibrillation  Rhythm:Irregular Rate:Tachycardia  Study Conclusions  - Procedure narrative: Transthoracic echocardiography. Image   quality was poor. The study was technically difficult, as a   result of poor acoustic windows and poor sound wave transmission. - Left ventricle: The cavity size was normal. Wall thickness was   increased in a pattern of moderate LVH. Systolic function was   normal. The estimated ejection fraction was in the range of 60%   to 65%. Incoordinate septal motion. Doppler parameters are   consistent with abnormal left ventricular relaxation (grade 1   diastolic dysfunction). The E/e&' ratio is >15, suggesting   elevated LV filling pressure. - Aortic valve: Poorly visualized. There was trivial regurgitation. - Mitral valve: Mildly thickened leaflets . There was trivial   regurgitation. - Left atrium: The atrium was normal in size.  1. Critical stenosis at the ostium of the LAD, large vessel, appears to be a suitable target for grafting 2. Severe stenosis of the first OM branch of the circumflex 3. Patent RCA, unable to selectively engage, suspected codominant vessel 4. Mild segmental LV contraction abnormality with LVEF estimated at 50-55%  Plan: IV heparin, TCTS consultation for consideration of CABG   Neuro/Psych  Headaches, negative psych ROS   GI/Hepatic negative GI ROS, Neg liver ROS,   Endo/Other  Hypothyroidism    Renal/GU negative Renal ROS     Musculoskeletal   Abdominal   Peds  Hematology   Anesthesia Other Findings   Reproductive/Obstetrics                            Anesthesia Physical  Anesthesia Plan  ASA: III  Anesthesia Plan: General   Post-op Pain Management:    Induction: Intravenous  PONV Risk Score and Plan: Ondansetron and Dexamethasone  Airway Management Planned: Oral ETT and LMA  Additional Equipment:   Intra-op Plan:   Post-operative Plan: Extubation in OR  Informed Consent: I have reviewed the patients History and Physical, chart, labs and discussed the procedure including the risks, benefits and alternatives for the proposed anesthesia with the patient or authorized representative who has indicated his/her understanding and acceptance.     Dental advisory given  Plan Discussed with: Anesthesiologist and CRNA  Anesthesia Plan Comments:        Anesthesia Quick Evaluation

## 2019-01-15 ENCOUNTER — Ambulatory Visit (HOSPITAL_COMMUNITY): Payer: Medicare HMO | Admitting: Anesthesiology

## 2019-01-15 ENCOUNTER — Encounter (HOSPITAL_COMMUNITY): Admission: RE | Payer: Self-pay | Source: Home / Self Care

## 2019-01-15 ENCOUNTER — Ambulatory Visit (HOSPITAL_COMMUNITY)
Admission: RE | Admit: 2019-01-15 | Discharge: 2019-01-15 | Disposition: A | Discharge status: Home / Self Care | Payer: Medicare HMO | Attending: Internal Medicine | Admitting: Internal Medicine

## 2019-01-15 ENCOUNTER — Other Ambulatory Visit: Payer: Self-pay

## 2019-01-15 ENCOUNTER — Encounter (HOSPITAL_COMMUNITY)
Admission: RE | Disposition: A | Discharge status: Home / Self Care | Payer: Self-pay | Source: Home / Self Care | Attending: Internal Medicine

## 2019-01-15 ENCOUNTER — Ambulatory Visit (HOSPITAL_COMMUNITY): Admission: RE | Admit: 2019-01-15 | Payer: Medicare HMO | Source: Home / Self Care | Admitting: Internal Medicine

## 2019-01-15 DIAGNOSIS — I110 Hypertensive heart disease with heart failure: Secondary | ICD-10-CM | POA: Diagnosis not present

## 2019-01-15 DIAGNOSIS — I6529 Occlusion and stenosis of unspecified carotid artery: Secondary | ICD-10-CM | POA: Diagnosis not present

## 2019-01-15 DIAGNOSIS — I4892 Unspecified atrial flutter: Secondary | ICD-10-CM | POA: Insufficient documentation

## 2019-01-15 DIAGNOSIS — Z7901 Long term (current) use of anticoagulants: Secondary | ICD-10-CM | POA: Diagnosis not present

## 2019-01-15 DIAGNOSIS — I484 Atypical atrial flutter: Secondary | ICD-10-CM | POA: Diagnosis not present

## 2019-01-15 DIAGNOSIS — I25110 Atherosclerotic heart disease of native coronary artery with unstable angina pectoris: Secondary | ICD-10-CM | POA: Insufficient documentation

## 2019-01-15 DIAGNOSIS — Z79899 Other long term (current) drug therapy: Secondary | ICD-10-CM | POA: Diagnosis not present

## 2019-01-15 DIAGNOSIS — E785 Hyperlipidemia, unspecified: Secondary | ICD-10-CM | POA: Diagnosis not present

## 2019-01-15 DIAGNOSIS — E039 Hypothyroidism, unspecified: Secondary | ICD-10-CM | POA: Diagnosis not present

## 2019-01-15 DIAGNOSIS — Z79890 Hormone replacement therapy (postmenopausal): Secondary | ICD-10-CM | POA: Diagnosis not present

## 2019-01-15 DIAGNOSIS — I5032 Chronic diastolic (congestive) heart failure: Secondary | ICD-10-CM | POA: Diagnosis not present

## 2019-01-15 DIAGNOSIS — I10 Essential (primary) hypertension: Secondary | ICD-10-CM | POA: Diagnosis not present

## 2019-01-15 HISTORY — PX: A-FLUTTER ABLATION: EP1230

## 2019-01-15 SURGERY — A-FLUTTER ABLATION
Anesthesia: General

## 2019-01-15 MED ORDER — HEPARIN (PORCINE) IN NACL 1000-0.9 UT/500ML-% IV SOLN
INTRAVENOUS | Status: DC | PRN
  Administered 2019-01-15: 500 mL

## 2019-01-15 MED ORDER — FENTANYL CITRATE (PF) 100 MCG/2ML IJ SOLN
INTRAMUSCULAR | Status: DC | PRN
  Administered 2019-01-15 (×2): 25 ug via INTRAVENOUS

## 2019-01-15 MED ORDER — PHENYLEPHRINE HCL-NACL 10-0.9 MG/250ML-% IV SOLN
INTRAVENOUS | Status: DC | PRN
  Administered 2019-01-15: 25 ug/min via INTRAVENOUS

## 2019-01-15 MED ORDER — SODIUM CHLORIDE 0.9% FLUSH: 3.0000 mL | Freq: Two times a day (BID) | INTRAVENOUS | Status: DC

## 2019-01-15 MED ORDER — BUPIVACAINE HCL (PF) 0.25 % IJ SOLN
INTRAMUSCULAR | Status: DC | PRN
  Administered 2019-01-15: 50 mL

## 2019-01-15 MED ORDER — ROCURONIUM BROMIDE 10 MG/ML (PF) SYRINGE
PREFILLED_SYRINGE | INTRAVENOUS | Status: DC | PRN
  Administered 2019-01-15: 50 mg via INTRAVENOUS

## 2019-01-15 MED ORDER — SUGAMMADEX SODIUM 200 MG/2ML IV SOLN
INTRAVENOUS | Status: DC | PRN
  Administered 2019-01-15: 140 mg via INTRAVENOUS

## 2019-01-15 MED ORDER — ONDANSETRON HCL 4 MG/2ML IJ SOLN
INTRAMUSCULAR | Status: DC | PRN
  Administered 2019-01-15: 4 mg via INTRAVENOUS

## 2019-01-15 MED ORDER — LIDOCAINE 2% (20 MG/ML) 5 ML SYRINGE
INTRAMUSCULAR | Status: DC | PRN
  Administered 2019-01-15: 60 mg via INTRAVENOUS

## 2019-01-15 MED ORDER — SODIUM CHLORIDE 0.9 % IV SOLN
INTRAVENOUS | Status: DC
  Administered 2019-01-15: 06:00:00 via INTRAVENOUS

## 2019-01-15 MED ORDER — DEXAMETHASONE SODIUM PHOSPHATE 10 MG/ML IJ SOLN
INTRAMUSCULAR | Status: DC | PRN
  Administered 2019-01-15: 4 mg via INTRAVENOUS

## 2019-01-15 MED ORDER — CARVEDILOL 6.25 MG PO TABS: 3.1250 mg | ORAL_TABLET | Freq: Two times a day (BID) | ORAL | 3 refills | Status: DC

## 2019-01-15 MED ORDER — PROPOFOL 10 MG/ML IV BOLUS
INTRAVENOUS | Status: DC | PRN
  Administered 2019-01-15: 170 mg via INTRAVENOUS

## 2019-01-15 MED ORDER — LACTATED RINGERS IV SOLN
INTRAVENOUS | Status: DC | PRN
  Administered 2019-01-15: 08:00:00 via INTRAVENOUS

## 2019-01-15 SURGICAL SUPPLY — 13 items
BAG SNAP BAND KOVER 36X36 (MISCELLANEOUS) ×1 IMPLANT
CATH BLAZERPRIME XP LG CV 10MM (ABLATOR) ×1 IMPLANT
CATH DUODECA HALO/ISMUS 7FR (CATHETERS) ×1 IMPLANT
CATH OCTAPOLOR 6F 125CM 2-5-2 (CATHETERS) ×1 IMPLANT
CATH QUAD COURNAND 5FR REPROC (CATHETERS) ×1 IMPLANT
PACK EP LATEX FREE (CUSTOM PROCEDURE TRAY) ×2
PACK EP LF (CUSTOM PROCEDURE TRAY) ×1 IMPLANT
PAD PRO RADIOLUCENT 2001M-C (PAD) ×2 IMPLANT
SHEATH ATRIAL FLUTTER SAFL 8F (SHEATH) ×1 IMPLANT
SHEATH PINNACLE 6F 10CM (SHEATH) ×1 IMPLANT
SHEATH PINNACLE 7F 10CM (SHEATH) ×1 IMPLANT
SHEATH PINNACLE 8F 10CM (SHEATH) ×2 IMPLANT
SHIELD RADPAD SCOOP 12X17 (MISCELLANEOUS) ×1 IMPLANT

## 2019-01-15 NOTE — Progress Notes (Signed)
Site area: left groin fv sheaths x2 Site Prior to Removal:  Level 0 Pressure Applied For: 15 minutes Manual:   yes Patient Status During Pull:  stable Post Pull Site:  Level 0  Post Pull Instructions Given:  yes Post Pull Pulses Present: left dp palpable Dressing Applied:  Gauze and tegaderm Bedrest begins @ 1010 Comments:  IV saline locked   

## 2019-01-15 NOTE — Progress Notes (Signed)
Marjean Donna EP PA was called and updated, prt having several pauses and is brady at times into 30's then back up to SR, cardiac tele is calling frequently, Dr Caryl Comes is here to review tele,

## 2019-01-15 NOTE — Anesthesia Procedure Notes (Addendum)
Procedure Name: Intubation Date/Time: 01/15/2019 7:53 AM Performed by: Leonor Liv, CRNA Pre-anesthesia Checklist: Patient identified, Emergency Drugs available, Suction available and Patient being monitored Patient Re-evaluated:Patient Re-evaluated prior to induction Oxygen Delivery Method: Circle System Utilized Preoxygenation: Pre-oxygenation with 100% oxygen Induction Type: IV induction Ventilation: Mask ventilation without difficulty Laryngoscope Size: Mac and 4 Grade View: Grade III Tube type: Oral Tube size: 7.5 mm Number of attempts: 1 Airway Equipment and Method: Stylet Placement Confirmation: ETT inserted through vocal cords under direct vision,  positive ETCO2 and breath sounds checked- equal and bilateral Secured at: 23 cm Tube secured with: Tape (left) Dental Injury: Teeth and Oropharynx as per pre-operative assessment

## 2019-01-15 NOTE — Transfer of Care (Signed)
Immediate Anesthesia Transfer of Care Note  Patient: Wayne Mitchell  Procedure(s) Performed: A-FLUTTER ABLATION (N/A )  Patient Location: Cath Lab  Anesthesia Type:General  Level of Consciousness: awake, alert  and oriented  Airway & Oxygen Therapy: Patient Spontanous Breathing and Patient connected to nasal cannula oxygen  Post-op Assessment: Report given to RN, Post -op Vital signs reviewed and stable and Patient moving all extremities X 4  Post vital signs: Reviewed and stable  Last Vitals:  Vitals Value Taken Time  BP 160/59 01/15/19 0926  Temp 35.7 C 01/15/19 0926  Pulse 71 01/15/19 0930  Resp 13 01/15/19 0930  SpO2 94 % 01/15/19 0930  Vitals shown include unvalidated device data.  Last Pain:  Vitals:   01/15/19 0926  TempSrc: Temporal  PainSc: 0-No pain         Complications: No apparent anesthesia complications

## 2019-01-15 NOTE — Progress Notes (Signed)
Site area: rt groin fv sheaths x2 Site Prior to Removal:  Level 0 Pressure Applied For:  15 minutes Manual:   yes Patient Status During Pull:  stable Post Pull Site:  Level  0 Post Pull Instructions Given:  yes Post Pull Pulses Present: rt dp palpable Dressing Applied:  Gauze and tegaderm Bedrest begins @  Comments:   

## 2019-01-15 NOTE — Anesthesia Postprocedure Evaluation (Signed)
Anesthesia Post Note  Patient: FREEDOM PEDDY  Procedure(s) Performed: A-FLUTTER ABLATION (N/A )     Patient location during evaluation: PACU Anesthesia Type: General Level of consciousness: awake and alert Pain management: pain level controlled Vital Signs Assessment: post-procedure vital signs reviewed and stable Respiratory status: spontaneous breathing, nonlabored ventilation, respiratory function stable and patient connected to nasal cannula oxygen Cardiovascular status: blood pressure returned to baseline and stable Postop Assessment: no apparent nausea or vomiting Anesthetic complications: no    Last Vitals:  Vitals:   01/15/19 1020 01/15/19 1022  BP: (!) 147/74   Pulse: 76   Resp: 18   Temp:  (!) 35.7 C  SpO2: 91%     Last Pain:  Vitals:   01/15/19 1022  TempSrc: Temporal  PainSc:                  Ravenna Legore DANIEL

## 2019-01-15 NOTE — Discharge Instructions (Signed)

## 2019-01-18 ENCOUNTER — Encounter (HOSPITAL_COMMUNITY): Payer: Self-pay | Admitting: Internal Medicine

## 2019-02-01 ENCOUNTER — Telehealth: Payer: Self-pay | Admitting: Internal Medicine

## 2019-02-01 MED ORDER — FUROSEMIDE 20 MG PO TABS: 20.0000 mg | ORAL_TABLET | Freq: Every day | ORAL | 3 refills | Status: DC

## 2019-02-01 MED ORDER — CARVEDILOL 6.25 MG PO TABS: 6.2500 mg | ORAL_TABLET | Freq: Two times a day (BID) | ORAL | 3 refills | Status: DC

## 2019-02-01 NOTE — Telephone Encounter (Signed)
Would increase coreg to 6.25 mg BID, and keep scheduled follow up. Have him please let us know if he has any further breakthrough episodes of tachycardia.     Thank you.   Legrand Como [id number]" [place], PA-C  02/01/2019 10:54 AM

## 2019-02-01 NOTE — Telephone Encounter (Signed)
I spoke with pt. He had ablation on 12/4. He checks BP and heart rate daily. Prior to yesterday BP was 134-154/80's. Pulse 80's.  Yesterday he reports having a "bad day."  Heart rate up and down.  He had two episodes where he felt dizzy. During one of these his BP was 91/70 and heart rate was 180.  Lasted about 15 minutes. Heart beat felt irregular.  After this BP was 133/ and heart rate 61.  Today heart rhythm feels regular. He took Coreg at 6 AM. BP at that time was 171/94 and pulse was 86.  At 7 AM his BP was 157/84 and pulse 72.  He is feeling back to normal today. Is taking Eliquis as listed.  Will send to provider in office for review. Pt also needs lasix refill sent to [facility] mail order. Will send  In.

## 2019-02-01 NOTE — Telephone Encounter (Signed)
I spoke with pt and gave him instructions from [place], [place].  Pt does not need new prescription sent in at this time.

## 2019-02-01 NOTE — Telephone Encounter (Signed)
Pt c/o BP issue: STAT if pt c/o blurred vision, one-sided weakness or slurred speech  1. What are your last 5 BP readings? 12/21  7am  157/84  HR 72   6am 171/94  HR 86 12/20 5pm 178/107 HR 84  1pm 126/81  HR 172  2. Are you having any other symptoms (ex. Dizziness, headache, blurred vision, passed out)? Was lightheaded yesterday.   3. What is your BP issue? States his BP is high and having irregular pulse.

## 2019-02-02 DIAGNOSIS — H6123 Impacted cerumen, bilateral: Secondary | ICD-10-CM | POA: Diagnosis not present

## 2019-02-08 ENCOUNTER — Other Ambulatory Visit: Payer: Self-pay | Admitting: Internal Medicine

## 2019-02-08 ENCOUNTER — Other Ambulatory Visit: Payer: Self-pay | Admitting: *Deleted

## 2019-02-08 MED ORDER — APIXABAN 5 MG PO TABS: 5.0000 mg | ORAL_TABLET | Freq: Two times a day (BID) | ORAL | 3 refills | Status: DC

## 2019-02-08 NOTE — Telephone Encounter (Addendum)
Eliquis 5mg  refill request received, pt is 83yrs old, weight-70.3kg, Crea-1.34 on 01/06/2019, Diagnosis-Aflutter, and last seen by Dr. Caryl Comes on 01/06/2019. Dose is appropriate based on dosing criteria. Will send in refill to requested pharmacy.  Original refill sent incorrectly so called [employer] and spoke with [facility] and she removed the incorrect one of 90 tabs and stated they will work on filling the 180 tabs with 3 refills.

## 2019-02-08 NOTE — Telephone Encounter (Signed)
*  STAT* If patient is at the pharmacy, call can be transferred to refill team.   1. Which medications need to be refilled? (please list name of each medication and dose if known) apixaban (ELIQUIS) 5 MG TABS tablet  2. Which pharmacy/location (including street and city if local pharmacy) is medication to be sent to? [place], [place]  3. Do they need a 30 day or 90 day supply? 90 day

## 2019-02-23 ENCOUNTER — Ambulatory Visit: Payer: Medicare HMO | Admitting: Internal Medicine

## 2019-02-23 ENCOUNTER — Other Ambulatory Visit: Payer: Self-pay

## 2019-02-23 VITALS — BP 148/64 | HR 68 | Ht 68.0 in | Wt 150.0 lb

## 2019-02-23 DIAGNOSIS — I4510 Unspecified right bundle-branch block: Secondary | ICD-10-CM

## 2019-02-23 DIAGNOSIS — I483 Typical atrial flutter: Secondary | ICD-10-CM | POA: Diagnosis not present

## 2019-02-23 DIAGNOSIS — I5032 Chronic diastolic (congestive) heart failure: Secondary | ICD-10-CM

## 2019-02-23 DIAGNOSIS — I509 Heart failure, unspecified: Secondary | ICD-10-CM | POA: Insufficient documentation

## 2019-02-23 NOTE — Progress Notes (Signed)
Patient Care Team: Georgann Housekeeper, MD as PCP - General (Internal Medicine) Lyn Records, MD as PCP - Cardiology (Cardiology)   HPI  Wayne Mitchell is a 84 y.o. male seen in followup for atrial flutter for which RFCA is anticipated next week He underwent preablation DCCV but 3 days ago he noted that his heart rate instead of being in the 70s was in the 120s.  He underwent catheter ablation 12/20  Date Cr K Hgb  11/20 1.30 3.6 13.3           History of coronary artery disease for which he underwent bypass surgery 4/19.  History of carotid disease and recurrent syncope.   Lightheadedness frequently with standing sometimes after meals sometimes with prolonged sitting.  Not with hot showers has been noted to be extremely pale.  Some presyncope but no syncope..  Thromboembolic risk factors ( age  -2, HTN-1, Vasc disease -1) for a CHADSVASc Score of >=4   Echocardiogram 4/19 demonstrated normal LV function with moderate LVH  Records and Results Reviewed   Past Medical History:  Diagnosis Date  . Carotid artery disease (HCC)   . Colon polyp    small  . Coronary artery disease   . Coronary artery disease involving native coronary artery of native heart with unstable angina pectoris (HCC) 05/30/2017   3V CABG 06/02/17  . HTN (hypertension)   . Hypothyroidism   . Syncope   . Syncope   . Thrombocytopenia (HCC)     Past Surgical History:  Procedure Laterality Date  . A-FLUTTER ABLATION N/A 01/15/2019   Procedure: A-FLUTTER ABLATION;  Surgeon: Duke Salvia, MD;  Location: [facility] INVASIVE CV LAB;  Service: Cardiovascular;  Laterality: N/A;  . BALLOON DILATION N/A 02/13/2018   Procedure: BALLOON DILATION;  Surgeon: Kerin Salen, MD;  Location: [facility] ENDOSCOPY;  Service: Gastroenterology;  Laterality: N/A;  . BIOPSY  02/13/2018   Procedure: BIOPSY;  Surgeon: Kerin Salen, MD;  Location: [facility] ENDOSCOPY;  Service: Gastroenterology;;  . CARDIOVERSION N/A 12/21/2018   Procedure:  CARDIOVERSION;  Surgeon: Chrystie Nose, MD;  Location: [facility] ENDOSCOPY;  Service: Cardiovascular;  Laterality: N/A;  . CATARACT EXTRACTION Left   . CORONARY ARTERY BYPASS GRAFT N/A 06/02/2017   Procedure: CORONARY ARTERY BYPASS GRAFTING (CABG) x3 Using Left Internal Mammary Artery and Right Leg Greater Saphenous Vein harvested endoscopically;  Surgeon: Kerin Perna, MD;  Location: [facility] OR;  Service: Open Heart Surgery;  Laterality: N/A;  . ESOPHAGOGASTRODUODENOSCOPY N/A 10/23/2014   Procedure: ESOPHAGOGASTRODUODENOSCOPY (EGD);  Surgeon: Bernette Redbird, MD;  Location: [facility] ENDOSCOPY;  Service: Endoscopy;  Laterality: N/A;  . ESOPHAGOGASTRODUODENOSCOPY (EGD) WITH PROPOFOL N/A 02/13/2018   Procedure: ESOPHAGOGASTRODUODENOSCOPY (EGD) WITH PROPOFOL;  Surgeon: Kerin Salen, MD;  Location: [facility] ENDOSCOPY;  Service: Gastroenterology;  Laterality: N/A;  . FOREIGN BODY REMOVAL  02/13/2018   Procedure: FOREIGN BODY REMOVAL;  Surgeon: Kerin Salen, MD;  Location: [facility] ENDOSCOPY;  Service: Gastroenterology;;  . Rafael Bihari EXCHANGE Right 10/24/2017   Procedure: GAS/FLUID EXCHANGE;  Surgeon: Carmela Rima, MD;  Location: [facility] OR;  Service: Ophthalmology;  Laterality: Right;  SF6  . HEMORRHOID SURGERY    . LEFT HEART CATH AND CORONARY ANGIOGRAPHY N/A 05/30/2017   Procedure: LEFT HEART CATH AND CORONARY ANGIOGRAPHY;  Surgeon: [name], MD;  Location: [facility] INVASIVE CV LAB;  Service: Cardiovascular;  Laterality: N/A;  . PARS PLANA VITRECTOMY Right 10/24/2017   Procedure: PARS PLANA VITRECTOMY WITH 25 GAUGE;  Surgeon: Carmela Rima, MD;  Location: [facility] OR;  Service: Ophthalmology;  Laterality: Right;  . PHOTOCOAGULATION Right 10/24/2017   Procedure: PHOTOCOAGULATION;  Surgeon: Jalene Mullet, MD;  Location: [place];  Service: Ophthalmology;  Laterality: Right;  . REFRACTIVE SURGERY Right   . TEE WITHOUT CARDIOVERSION N/A 06/02/2017   Procedure: TRANSESOPHAGEAL ECHOCARDIOGRAM (TEE);  Surgeon: Prescott Gum, Collier Salina, MD;  Location: [place];  Service: Open Heart Surgery;  Laterality: N/A;    Current Meds  Medication Sig  . apixaban (ELIQUIS) 5 MG TABS tablet Take 1 tablet (5 mg total) by mouth 2 (two) times daily.  Marland Kitchen atorvastatin (LIPITOR) 20 MG tablet Take 20 mg by mouth daily.  . carvedilol (COREG) 6.25 MG tablet Take 1 tablet (6.25 mg total) by mouth 2 (two) times daily.  . furosemide (LASIX) 20 MG tablet Take 1 tablet (20 mg total) by mouth daily.  Marland Kitchen levothyroxine (SYNTHROID, LEVOTHROID) 112 MCG tablet Take 112 mcg by mouth daily before breakfast.  . pantoprazole (PROTONIX) 40 MG tablet Take 40 mg by mouth 2 (two) times daily.     Allergies  Allergen Reactions  . Lisinopril Cough      Review of Systems negative except from HPI and PMH  Physical Exam BP (!) 148/64   Pulse 68   Ht 5\' 8"  (1.727 m)   Wt 150 lb (68 kg)   BMI 22.81 kg/m  Well developed and well nourished in no acute distress HENT normal E scleral and icterus clear Neck Supple JVP flat; carotids brisk and full right carotid bruit Clear to ausculation Rapid but regular rate and rhythm, no murmurs gallops or rub Soft with active bowel sounds No clubbing cyanosis  Edema Alert and oriented, grossly normal motor and sensory function Skin Warm and Dry  ECG ` sinus at 68 Interval 16/13/45 Right bundle branch block Axis 81  Assessment and  Plan Atrial flutter recurrent   Hypertension  CHF chronic diastolic   CAD  S/p CABG   RBBB/RAD   Presyncope  Orthostatic hypotension No recurrent atrial flutter.    We will discontinue Eliquis.  Orthostatic vital signs show orthostatic hypotension-- have reviewed the physiology.  At this point we will not use medications.  We could use pyridostigmine if necessary, dicussed the role of isometric exercise    Current medicines are reviewed at length with the patient today .  The patient does not  have concerns regarding medicines.

## 2019-02-23 NOTE — Patient Instructions (Signed)
Medication Instructions:  Your physician has recommended you make the following change in your medication:  STOP YOUR ELIQUIS  If you need a refill on your cardiac medications before your next appointment, please call your pharmacy*  Lab Work: None ordered.  If you have labs (blood work) drawn today and your tests are completely normal, you will receive your results only by: Marland Kitchen MyChart Message (if you have MyChart) OR . A paper copy in the mail If you have any lab test that is abnormal or we need to change your treatment, we will call you to review the results.  Testing/Procedures: None ordered.   Follow-Up: At [facility], you and your health needs are our priority.  As part of our continuing mission to provide you with exceptional heart care, we have created designated Provider Care Teams.  These Care Teams include your primary Cardiologist (physician) and Advanced Practice Providers (APPs -  Physician Assistants and Nurse Practitioners) who all work together to provide you with the care you need, when you need it.  Your next appointment:  6 months with Dr Graciela Husbands

## 2019-04-19 DIAGNOSIS — N1831 Chronic kidney disease, stage 3a: Secondary | ICD-10-CM | POA: Diagnosis not present

## 2019-04-19 DIAGNOSIS — R7303 Prediabetes: Secondary | ICD-10-CM | POA: Diagnosis not present

## 2019-04-19 DIAGNOSIS — D696 Thrombocytopenia, unspecified: Secondary | ICD-10-CM | POA: Diagnosis not present

## 2019-04-19 DIAGNOSIS — I25810 Atherosclerosis of coronary artery bypass graft(s) without angina pectoris: Secondary | ICD-10-CM | POA: Diagnosis not present

## 2019-04-19 DIAGNOSIS — I10 Essential (primary) hypertension: Secondary | ICD-10-CM | POA: Diagnosis not present

## 2019-04-19 DIAGNOSIS — E039 Hypothyroidism, unspecified: Secondary | ICD-10-CM | POA: Diagnosis not present

## 2019-04-19 DIAGNOSIS — I700 Atherosclerosis of aorta: Secondary | ICD-10-CM | POA: Diagnosis not present

## 2019-04-19 DIAGNOSIS — K2270 Barrett's esophagus without dysplasia: Secondary | ICD-10-CM | POA: Diagnosis not present

## 2019-04-19 DIAGNOSIS — E7800 Pure hypercholesterolemia, unspecified: Secondary | ICD-10-CM | POA: Diagnosis not present

## 2019-05-15 ENCOUNTER — Encounter (HOSPITAL_COMMUNITY): Payer: Self-pay | Admitting: Emergency Medicine

## 2019-05-15 ENCOUNTER — Emergency Department (HOSPITAL_COMMUNITY): Payer: Medicare HMO

## 2019-05-15 ENCOUNTER — Other Ambulatory Visit: Payer: Self-pay

## 2019-05-15 ENCOUNTER — Inpatient Hospital Stay (HOSPITAL_COMMUNITY)
Admission: EM | Admit: 2019-05-15 | Discharge: 2019-05-18 | DRG: 243 | Disposition: A | Discharge status: Home / Self Care | Payer: Medicare HMO | Attending: Cardiovascular Disease | Admitting: Cardiovascular Disease

## 2019-05-15 DIAGNOSIS — Y92008 Other place in unspecified non-institutional (private) residence as the place of occurrence of the external cause: Secondary | ICD-10-CM

## 2019-05-15 DIAGNOSIS — I495 Sick sinus syndrome: Secondary | ICD-10-CM | POA: Diagnosis not present

## 2019-05-15 DIAGNOSIS — I10 Essential (primary) hypertension: Secondary | ICD-10-CM

## 2019-05-15 DIAGNOSIS — M25511 Pain in right shoulder: Secondary | ICD-10-CM | POA: Diagnosis not present

## 2019-05-15 DIAGNOSIS — R0902 Hypoxemia: Secondary | ICD-10-CM | POA: Diagnosis not present

## 2019-05-15 DIAGNOSIS — D696 Thrombocytopenia, unspecified: Secondary | ICD-10-CM | POA: Diagnosis present

## 2019-05-15 DIAGNOSIS — W010XXA Fall on same level from slipping, tripping and stumbling without subsequent striking against object, initial encounter: Secondary | ICD-10-CM | POA: Diagnosis present

## 2019-05-15 DIAGNOSIS — Z79890 Hormone replacement therapy (postmenopausal): Secondary | ICD-10-CM | POA: Diagnosis not present

## 2019-05-15 DIAGNOSIS — I2510 Atherosclerotic heart disease of native coronary artery without angina pectoris: Secondary | ICD-10-CM | POA: Diagnosis present

## 2019-05-15 DIAGNOSIS — R001 Bradycardia, unspecified: Secondary | ICD-10-CM | POA: Diagnosis not present

## 2019-05-15 DIAGNOSIS — I4891 Unspecified atrial fibrillation: Secondary | ICD-10-CM | POA: Diagnosis present

## 2019-05-15 DIAGNOSIS — S0990XA Unspecified injury of head, initial encounter: Secondary | ICD-10-CM

## 2019-05-15 DIAGNOSIS — S3993XA Unspecified injury of pelvis, initial encounter: Secondary | ICD-10-CM | POA: Diagnosis not present

## 2019-05-15 DIAGNOSIS — Z8679 Personal history of other diseases of the circulatory system: Secondary | ICD-10-CM | POA: Diagnosis not present

## 2019-05-15 DIAGNOSIS — Z79899 Other long term (current) drug therapy: Secondary | ICD-10-CM

## 2019-05-15 DIAGNOSIS — Z950 Presence of cardiac pacemaker: Secondary | ICD-10-CM

## 2019-05-15 DIAGNOSIS — Z951 Presence of aortocoronary bypass graft: Secondary | ICD-10-CM

## 2019-05-15 DIAGNOSIS — I471 Supraventricular tachycardia: Secondary | ICD-10-CM | POA: Diagnosis not present

## 2019-05-15 DIAGNOSIS — S0101XA Laceration without foreign body of scalp, initial encounter: Secondary | ICD-10-CM | POA: Diagnosis not present

## 2019-05-15 DIAGNOSIS — R55 Syncope and collapse: Secondary | ICD-10-CM

## 2019-05-15 DIAGNOSIS — I959 Hypotension, unspecified: Secondary | ICD-10-CM | POA: Diagnosis not present

## 2019-05-15 DIAGNOSIS — Z20822 Contact with and (suspected) exposure to covid-19: Secondary | ICD-10-CM | POA: Diagnosis present

## 2019-05-15 DIAGNOSIS — R918 Other nonspecific abnormal finding of lung field: Secondary | ICD-10-CM | POA: Diagnosis not present

## 2019-05-15 DIAGNOSIS — E785 Hyperlipidemia, unspecified: Secondary | ICD-10-CM | POA: Diagnosis not present

## 2019-05-15 DIAGNOSIS — E039 Hypothyroidism, unspecified: Secondary | ICD-10-CM

## 2019-05-15 DIAGNOSIS — J984 Other disorders of lung: Secondary | ICD-10-CM

## 2019-05-15 HISTORY — DX: Presence of aortocoronary bypass graft: Z95.1

## 2019-05-15 LAB — URINALYSIS, ROUTINE W REFLEX MICROSCOPIC
Bilirubin Urine: NEGATIVE
Glucose, UA: NEGATIVE mg/dL
Hgb urine dipstick: NEGATIVE
Ketones, ur: 5 mg/dL — AB
Leukocytes,Ua: NEGATIVE
Nitrite: NEGATIVE
Protein, ur: NEGATIVE mg/dL
Specific Gravity, Urine: 1.005 (ref 1.005–1.030)
pH: 8 (ref 5.0–8.0)

## 2019-05-15 LAB — TROPONIN I (HIGH SENSITIVITY)
Troponin I (High Sensitivity): 9 ng/L (ref <18)
Troponin I (High Sensitivity): 9 ng/L (ref <18)

## 2019-05-15 LAB — CBC WITH DIFFERENTIAL/PLATELET
Abs Immature Granulocytes: 0.02 10*3/uL (ref 0.00–0.07)
Basophils Absolute: 0.1 10*3/uL (ref 0.0–0.1)
Basophils Relative: 1 %
Eosinophils Absolute: 0.4 10*3/uL (ref 0.0–0.5)
Eosinophils Relative: 7 %
HCT: 40.5 % (ref 39.0–52.0)
Hemoglobin: 12.8 g/dL — ABNORMAL LOW (ref 13.0–17.0)
Immature Granulocytes: 0 %
Lymphocytes Relative: 16 %
Lymphs Abs: 0.9 10*3/uL (ref 0.7–4.0)
MCH: 28.6 pg (ref 26.0–34.0)
MCHC: 31.6 g/dL (ref 30.0–36.0)
MCV: 90.4 fL (ref 80.0–100.0)
Monocytes Absolute: 0.6 10*3/uL (ref 0.1–1.0)
Monocytes Relative: 10 %
Neutro Abs: 3.8 10*3/uL (ref 1.7–7.7)
Neutrophils Relative %: 66 %
Platelets: 77 10*3/uL — ABNORMAL LOW (ref 150–400)
RBC: 4.48 MIL/uL (ref 4.22–5.81)
RDW: 16.5 % — ABNORMAL HIGH (ref 11.5–15.5)
WBC: 5.8 10*3/uL (ref 4.0–10.5)
nRBC: 0 % (ref 0.0–0.2)

## 2019-05-15 LAB — SARS CORONAVIRUS 2 (TAT 6-24 HRS): SARS Coronavirus 2: NEGATIVE

## 2019-05-15 LAB — COMPREHENSIVE METABOLIC PANEL
ALT: 20 U/L (ref 0–44)
AST: 24 U/L (ref 15–41)
Albumin: 3.7 g/dL (ref 3.5–5.0)
Alkaline Phosphatase: 107 U/L (ref 38–126)
Anion gap: 10 (ref 5–15)
BUN: 15 mg/dL (ref 8–23)
CO2: 26 mmol/L (ref 22–32)
Calcium: 8.9 mg/dL (ref 8.9–10.3)
Chloride: 102 mmol/L (ref 98–111)
Creatinine, Ser: 1.32 mg/dL — ABNORMAL HIGH (ref 0.61–1.24)
GFR calc Af Amer: 58 mL/min — ABNORMAL LOW (ref >60)
GFR calc non Af Amer: 50 mL/min — ABNORMAL LOW (ref >60)
Glucose, Bld: 115 mg/dL — ABNORMAL HIGH (ref 70–99)
Potassium: 4.3 mmol/L (ref 3.5–5.1)
Sodium: 138 mmol/L (ref 135–145)
Total Bilirubin: 1.2 mg/dL (ref 0.3–1.2)
Total Protein: 6.9 g/dL (ref 6.5–8.1)

## 2019-05-15 LAB — RAPID URINE DRUG SCREEN, HOSP PERFORMED
Amphetamines: NOT DETECTED
Barbiturates: NOT DETECTED
Benzodiazepines: NOT DETECTED
Cocaine: NOT DETECTED
Opiates: NOT DETECTED
Tetrahydrocannabinol: NOT DETECTED

## 2019-05-15 LAB — PROTIME-INR
INR: 1.1 (ref 0.8–1.2)
Prothrombin Time: 14.5 seconds (ref 11.4–15.2)

## 2019-05-15 LAB — ETHANOL: Alcohol, Ethyl (B): <10 mg/dL (ref <10)

## 2019-05-15 MED ORDER — ONDANSETRON HCL 4 MG/2ML IJ SOLN: 4.0000 mg | Freq: Four times a day (QID) | INTRAMUSCULAR | Status: DC | PRN

## 2019-05-15 MED ORDER — AMLODIPINE BESYLATE 5 MG PO TABS: 20.0000 mg | ORAL_TABLET | Freq: Every day | ORAL | Status: DC

## 2019-05-15 MED ORDER — FUROSEMIDE 20 MG PO TABS
20.0000 mg | ORAL_TABLET | Freq: Every day | ORAL | Status: DC
  Administered 2019-05-15 – 2019-05-18 (×4): 20 mg via ORAL
  Filled 2019-05-15 (×4): qty 1

## 2019-05-15 MED ORDER — LIDOCAINE HCL (PF) 1 % IJ SOLN
5.0000 mL | Freq: Once | INTRAMUSCULAR | Status: DC
  Filled 2019-05-15: qty 5

## 2019-05-15 MED ORDER — HEPARIN SODIUM (PORCINE) 5000 UNIT/ML IJ SOLN
5000.0000 [IU] | Freq: Three times a day (TID) | INTRAMUSCULAR | Status: DC
  Administered 2019-05-15 – 2019-05-17 (×5): 5000 [IU] via SUBCUTANEOUS
  Filled 2019-05-15 (×5): qty 1

## 2019-05-15 MED ORDER — ATORVASTATIN CALCIUM 10 MG PO TABS
20.0000 mg | ORAL_TABLET | Freq: Every day | ORAL | Status: DC
  Administered 2019-05-15 – 2019-05-18 (×4): 20 mg via ORAL
  Filled 2019-05-15 (×4): qty 2

## 2019-05-15 MED ORDER — AMLODIPINE BESYLATE 10 MG PO TABS
10.0000 mg | ORAL_TABLET | Freq: Every day | ORAL | Status: DC
  Administered 2019-05-15 – 2019-05-18 (×4): 10 mg via ORAL
  Filled 2019-05-15 (×4): qty 1

## 2019-05-15 MED ORDER — LEVOTHYROXINE SODIUM 112 MCG PO TABS
112.0000 ug | ORAL_TABLET | Freq: Every day | ORAL | Status: DC
  Administered 2019-05-16 – 2019-05-18 (×3): 112 ug via ORAL
  Filled 2019-05-15 (×3): qty 1

## 2019-05-15 MED ORDER — PANTOPRAZOLE SODIUM 40 MG PO TBEC
40.0000 mg | DELAYED_RELEASE_TABLET | Freq: Two times a day (BID) | ORAL | Status: DC
  Administered 2019-05-15 – 2019-05-18 (×6): 40 mg via ORAL
  Filled 2019-05-15 (×6): qty 1

## 2019-05-15 MED ORDER — ACETAMINOPHEN 325 MG PO TABS: 650.0000 mg | ORAL_TABLET | ORAL | Status: DC | PRN

## 2019-05-15 NOTE — ED Triage Notes (Signed)
Pt here ems and had a syncope episode LOC doesn't remember event. Laceration to back of head not on any thinners. Denies pin to back or neck. Pt refused c-colllar. Brady at first with ems. Started amlodipine within the last month. Hx of CABG, htn.

## 2019-05-15 NOTE — ED Provider Notes (Addendum)
[facility] Provider Note   CSN: [id number] Arrival date & time: 05/15/19  1036     History Chief Complaint  Patient presents with   Fall   Loss of Consciousness    [facility] is a 84 y.o. male.  HPI Patient reports he has been getting brief dizzy spells since starting amlodipine about a month ago.  However, these are not occurring with standing or position changes.  He can be at rest and just have a few seconds of feeling dizzy.  Thursday, he was driving with his wife and seemed temporarily confused about taking the return on the road to their house.  He pointed out that the road was not lighted.  His wife did not think too much about it.  They got home and everything seemed all right.  Last night, he was moving the garbage cans and fell in between them.  He reports he did not get any warning and he just suddenly passed out.  He denies any prodromal shortness of breath, diaphoresis, lightheadedness, chest pain.  His wife reports he awakened immediately and he seemed normal.  He did not have any evidence of any injury.  They did not seek further treatment at that time.  Today, she was paying attention to make sure he was well-hydrated.  They had checked his blood pressure several times and it was in the 120s or 140 systolic.  He was standing at the sink peeling potatoes and she had just walked behind him and checked on him.  When she walked past him, he suddenly collapsed to the floor.  He reports again he had no warning and all of a sudden was just out.  There was no seizure activity observed.  He did strike the back of his head and get a laceration this time.  His wife reports she did check for any facial asymmetry or focal weakness and there was none at that time.  She also rechecked his blood pressures and they were normal systolic after the event.  Heart rate however had dipped into the 40s.  She does report that cardiology was going to be evaluating  him in the next several months for possible pacemaker placement.  Patient reports that since the fall, he has pain in the back of the right shoulder and right buttock.  He does not have any weakness numbness or tingling to the extremities.  No confusion.      Past Medical History:  Diagnosis Date   Carotid artery disease (HCC)    Colon polyp    small   Coronary artery disease    Coronary artery disease involving native coronary artery of native heart with unstable angina pectoris (HCC) 05/30/2017   3V CABG 06/02/17   HTN (hypertension)    Hx of CABG    Hypothyroidism    Syncope    Syncope    Thrombocytopenia [facility])     Patient Active Problem List   Diagnosis Date Noted   Congestive heart failure (CHF) (HCC) 02/23/2019   RBBB 02/23/2019   Typical atrial flutter (HCC)    PVC's (premature ventricular contractions) 08/18/2017   Coronary artery disease 06/02/2017   S/P CABG x 3 06/02/2017   Unstable angina (HCC) 05/30/2017   Coronary artery disease involving coronary bypass graft of native heart with angina pectoris (HCC) 05/30/2017   Syncope, vasovagal 08/28/2015   Bilateral carotid artery disease (HCC) 08/28/2015   Thrombocytopenia (HCC) 08/28/2015   Syncope 08/05/2015  Dizziness 08/05/2015   Essential (primary) hypertension 05/19/2015   Supraventricular tachycardia ([place]) 09/04/2013   HLD (hyperlipidemia) 07/24/2013   BP (high blood pressure) 07/24/2013   Adult hypothyroidism 07/24/2013    Past Surgical History:  Procedure Laterality Date   A-FLUTTER ABLATION N/A 01/15/2019   Procedure: A-FLUTTER ABLATION;  Surgeon: Deboraha Sprang, MD;  Location: [place] [facility];  Service: Cardiovascular;  Laterality: N/A;   BALLOON DILATION N/A 02/13/2018   Procedure: BALLOON DILATION;  Surgeon: Ronnette Juniper, MD;  Location: [facility] END[facility];  Service: Gastroenterology;  Laterality: N/A;   BIOPSY  02/13/2018   Procedure: BIOPSY;  Surgeon: Ronnette Juniper, MD;   Location: [facility];  Service: Gastroenterology;;   CARDIOVERSION N/A 12/21/2018   Procedure: CARDIOVERSION;  Surgeon: Pixie Casino, MD;  Location: [facility];  Service: Cardiovascular;  Laterality: N/A;   CATARACT EXTRACTION Left    CORONARY ARTERY BYPASS GRAFT N/A 06/02/2017   Procedure: CORONARY ARTERY BYPASS GRAFTING (CABG) x3 Using Left Internal Mammary Artery and Right Leg Greater Saphenous Vein harvested endoscopically;  Surgeon: Ivin Poot, MD;  Location: [place];  Service: Open Heart Surgery;  Laterality: N/A;   ESOPHAGOGASTRODUODENOSCOPY N/A 10/23/2014   Procedure: ESOPHAGOGASTRODUODENOSCOPY (EGD);  Surgeon: Ronald Lobo, MD;  Location: [facility];  Service: Endoscopy;  Laterality: N/A;   ESOPHAGOGASTRODUODENOSCOPY (EGD) WITH PROPOFOL N/A 02/13/2018   Procedure: ESOPHAGOGASTRODUODENOSCOPY (EGD) WITH PROPOFOL;  Surgeon: Ronnette Juniper, MD;  Location: [facility]OS[facility];  Service: Gastroenterology;  Laterality: N/A;   FOREIGN BODY REMOVAL  02/13/2018   Procedure: FOREIGN BODY REMOVAL;  Surgeon: Ronnette Juniper, MD;  Location: [facility];  Service: Gastroenterology;;   GAS/FLUID EXCHANGE Right 10/24/2017   Procedure: GAS/FLUID EXCHANGE;  Surgeon: Jalene Mullet, MD;  Location: [place];  Service: Ophthalmology;  Laterality: Right;  SF6   HEMORRHOID SURGERY     LEFT HEART CATH AND CORONARY ANGIOGRAPHY N/A 05/30/2017   Procedure: LEFT HEART CATH AND CORONARY ANGIOGRAPHY;  Surgeon: Sherren Mocha, MD;  Location: [place] [facility];  Service: Cardiovascular;  Laterality: N/A;   PARS PLANA VITRECTOMY Right 10/24/2017   Procedure: PARS PLANA VITRECTOMY WITH 25 GAUGE;  Surgeon: Jalene Mullet, MD;  Location: [place];  Service: Ophthalmology;  Laterality: Right;   PHOTOCOAGULATION Right 10/24/2017   Procedure: PHOTOCOAGULATION;  Surgeon: Jalene Mullet, MD;  Location: [place];  Service: Ophthalmology;  Laterality: Right;   REFRACTIVE SURGERY Right    TEE WITHOUT CARDIOVERSION N/A 06/02/2017     Procedure: TRANSESOPHAGEAL ECHOCARDIOGRAM (TEE);  Surgeon: Prescott Gum, Collier Salina, MD;  Location: [place];  Service: Open Heart Surgery;  Laterality: N/A;       Family History  Problem Relation Age of Onset   Pancreatic cancer Mother    Healthy Brother     Social History   Tobacco Use   Smoking status: Never Smoker   Smokeless tobacco: Never Used  Substance Use Topics   Alcohol use: Yes    Alcohol/week: 4.0 standard drinks    Types: 4 Cans of beer per week   Drug use: No    Home Medications Prior to Admission medications   Medication Sig Start Date End Date Taking? Authorizing Provider  atorvastatin (LIPITOR) 20 MG tablet Take 20 mg by mouth daily.    [provider]  carvedilol (COREG) 6.25 MG tablet Take 1 tablet (6.25 mg total) by mouth 2 (two) times daily. 02/01/19   Shirley Friar, PA-C  furosemide (LASIX) 20 MG tablet Take 1 tablet (20 mg total) by mouth daily. 02/01/19   Tamala Julian,  Barry Dienes, MD  levothyroxine (SYNTHROID, LEVOTHROID) 112 MCG tablet Take 112 mcg by mouth daily before breakfast.    [provider]  pantoprazole (PROTONIX) 40 MG tablet Take 40 mg by mouth 2 (two) times daily.  04/29/18   [provider]    Allergies    Lisinopril  Review of Systems   Review of Systems 10 Systems reviewed and are negative for acute change except as noted in the HPI.  Physical Exam Updated Vital Signs BP (!) 164/73 (BP Location: Right Arm)    Pulse 60    Temp 97.6 F (36.4 C) (Oral)    Resp 16    Ht 5\' 8"  (1.727 m)    Wt 68 kg    SpO2 100%    BMI 22.79 kg/m   Physical Exam Constitutional:      Comments: Alert nontoxic and well in appearance.  No respiratory distress.  HENT:     Head:     Comments: Laceration to posterior scalp.  No facial trauma.  No facial trauma.    Mouth/Throat:     Mouth: Mucous membranes are moist.     Pharynx: Oropharynx is clear.  Eyes:     Extraocular Movements: Extraocular movements intact.     Pupils:  Pupils are equal, round, and reactive to light.  Cardiovascular:     Rate and Rhythm: Normal rate and regular rhythm.  Pulmonary:     Effort: Pulmonary effort is normal.     Breath sounds: Normal breath sounds.  Chest:     Chest wall: No tenderness.  Abdominal:     General: There is no distension.     Palpations: Abdomen is soft.     Tenderness: There is no abdominal tenderness. There is no guarding.  Musculoskeletal:     Cervical back: Neck supple.     Comments: Some tenderness over right shoulder.  No redness or abrasion.  Lower extremities no deformity or injury.  Normal range of motion.  No peripheral edema.  Skin:    General: Skin is warm and dry.  Neurological:     Comments: Cranial nerves II through XII intact (notably patient does have a bit of a chronic I lid droop on the right and slight asymmetry of the mouth on the right.  Patient's wife reports this has been chronic since he had a thyroid treatment) motor strength 5\5 upper and lower extremities.  Sensation intact to light touch x4 extremities.  Cognitive function and speech normal.  Psychiatric:        Mood and Affect: Mood normal.     ED Results / Procedures / Treatments   Labs (all labs ordered are listed, but only abnormal results are displayed) Labs Reviewed  COMPREHENSIVE METABOLIC PANEL - Abnormal; Notable for the following components:      Result Value   Glucose, Bld 115 (*)    Creatinine, Ser 1.32 (*)    GFR calc non Af Amer 50 (*)    GFR calc Af Amer 58 (*)    All other components within normal limits  CBC WITH DIFFERENTIAL/PLATELET - Abnormal; Notable for the following components:   Hemoglobin 12.8 (*)    RDW 16.5 (*)    All other components within normal limits  ETHANOL  PROTIME-INR  URINALYSIS, ROUTINE W REFLEX MICROSCOPIC  RAPID URINE DRUG SCREEN, HOSP PERFORMED  TROPONIN I (HIGH SENSITIVITY)    EKG EKG Interpretation  Date/Time:  Saturday May 15 2019 10:41:12 EDT Ventricular Rate:   60 PR  Interval:    QRS Duration: 162 QT Interval:  485 QTC Calculation: 485 R Axis:   90 Text Interpretation: Sinus rhythm Ventricular premature complex Right bundle branch block no sig change from previous Confirmed by Arby Barrette [phone]) on 05/15/2019 11:50:02 AM   Radiology No results found.  Procedures .Marland KitchenLaceration Repair  Date/Time: 05/15/2019 1:20 PM Performed by: Arby Barrette, MD Authorized by: Arby Barrette, MD   Consent:    Consent obtained:  Verbal   Consent given by:  Patient   Risks discussed:  Infection, pain and poor wound healing Anesthesia (see MAR for exact dosages):    Anesthesia method:  None Laceration details:    Location:  Scalp   Scalp location:  Occipital   Length (cm):  1   Depth (mm):  5 Repair type:    Repair type:  Simple Pre-procedure details:    Preparation:  Patient was prepped and draped in usual sterile fashion Exploration:    Wound exploration: entire depth of wound probed and visualized   Treatment:    Area cleansed with:  Betadine and saline   Amount of cleaning:  Standard Skin repair:    Repair method:  Staples   Number of staples:  1 Approximation:    Approximation:  Close Post-procedure details:    Dressing:  Antibiotic ointment   (including critical care time)  Medications Ordered in ED Medications - No data to display  ED Course  I have reviewed the triage vital signs and the nursing notes.  Pertinent labs & imaging results that were available during my care of the patient were reviewed by me and considered in my medical decision making (see chart for details).  Clinical Course as of May 14 1604  Sat May 15, 2019  1330 Consult: Reviewed with Dr.Croitoru. will consult in the ED   [MP]    Clinical Course User Index [MP] Arby Barrette, MD   MDM Rules/Calculators/A&P                     Patient presents with 2 syncopal episodes within 24 hours.  Neither had associated prodromal symptoms.  Patient had not  had sudden position changes.  This does not sound likely to be orthostatic.  His wife had checked his blood pressures just after this morning's event and identifies systolic blood pressure of 120.  Heart rate however was bradycardic in the 40s.  EMS strip at bedside shows episode of long pause and bradycardia.  This resolved by time of arrival to the emergency department.  Patient has had sinus rhythm in the 70s.  Patient had an ablation early in the year for atrial flutter with tachycardic episodes.  The patient reports his cardiologist has been discussing placement of a pacemaker. Final Clinical Impression(s) / ED Diagnoses Final diagnoses:  Syncope and collapse  Injury of head, initial encounter  Laceration of scalp, initial encounter    Rx / DC Orders ED Discharge Orders    None       Arby Barrette, MD 05/15/19 1323    Arby Barrette, MD 05/15/19 1608    Arby Barrette, MD 05/15/19 [phone]

## 2019-05-15 NOTE — ED Notes (Signed)
Got patient undress into a gown on the monitor did ekg shown to the er provider patient is resting with call bell in reach got patient a warm blanket

## 2019-05-15 NOTE — H&P (Signed)
Cardiology Admission History and Physical:   Patient ID: Wayne Mitchell MRN: [id number]; DOB: [birth year]   Admission date: 05/15/2019  Primary Care Provider: Georgann Housekeeper, MD Primary Cardiologist: Lesleigh Noe, MD  Primary Electrophysiologist:  Graciela Husbands  Chief Complaint:  Syncope  Patient Profile:   Wayne Mitchell is a 84 y.o. male with 2 recent episodes of unheralded syncope at rest.  Has a history of cavotricuspid isthmus ablation for atrial flutter and coronary artery disease with previous bypass surgery (2019).  History of Present Illness:   Wayne Mitchell had 2 syncopal episodes that were different from previous events.  In the past he has had recurrent syncope with a pattern suggestive of vasovagal or orthostatic hypotension events (changing positions, after meals, etc.).  2 recent episodes of syncope were completely unheralded without a prodrome associated with complete loss of consciousness.  Yesterday evening he passed out while moving the garbage cans, without getting any warning.  Normal.  Thankfully he did not have any injury.  For that he was well hydrated and checked his blood pressure several times during the day today, typically in the 120-140 systolic range.  Earlier today he was standing at the sink peeling potatoes suddenly collapsed to the floor without any warning symptoms.  There was no evidence of seizure activity  and he did strike the back of his head and has a small laceration.  Immediately rechecked his blood pressure after the event and it was normal but found that his heart rate was in the 40s.  Tracings performed by EMS show marked sinus bradycardia, post PVC pauses of almost 2 seconds with junctional escape beats.  While in the emergency room he has had alternating periods of ectopic atrial tachycardia at about 130 bpm with periods of severe sinus bradycardia as low as 30 bpm.  He is completely unaware of these changes in rhythm and denies palpitations or new  syncopal events while in the emergency room, while sitting in bed.  The only medication with chronotropic effect that he takes is carvedilol 6.25 mg twice daily.  Routine labs have all been within normal range including very low troponin levels.  He has chronic mild thrombocytopenia.  Toxicology is negative.  Chest x-ray is unremarkable.  CT does not show intracranial hemorrhage or cervical spine injury.  The twelve-lead ECG in the emergency room showed sinus rhythm with occasional PVCs and old right bundle branch block.  He is on levothyroxine supplementation, and TSH is not available since April 2019 when he was mildly elevated.   Past Medical History:  Diagnosis Date  . Carotid artery disease (HCC)   . Colon polyp    small  . Coronary artery disease   . Coronary artery disease involving native coronary artery of native heart with unstable angina pectoris (HCC) 05/30/2017   3V CABG 06/02/17  . HTN (hypertension)   . Hx of CABG   . Hypothyroidism   . Syncope   . Syncope   . Thrombocytopenia (HCC)     Past Surgical History:  Procedure Laterality Date  . A-FLUTTER ABLATION N/A 01/15/2019   Procedure: A-FLUTTER ABLATION;  Surgeon: Duke Salvia, MD;  Location: [facility] INVASIVE CV LAB;  Service: Cardiovascular;  Laterality: N/A;  . BALLOON DILATION N/A 02/13/2018   Procedure: BALLOON DILATION;  Surgeon: Kerin Salen, MD;  Location: [facility] ENDOSCOPY;  Service: Gastroenterology;  Laterality: N/A;  . BIOPSY  02/13/2018   Procedure: BIOPSY;  Surgeon: Kerin Salen, MD;  Location: [facility] ENDOSCOPY;  Service:  Gastroenterology;;  . CARDIOVERSION N/A 12/21/2018   Procedure: CARDIOVERSION;  Surgeon: Chrystie Nose, MD;  Location: [facility] ENDOSCOPY;  Service: Cardiovascular;  Laterality: N/A;  . CATARACT EXTRACTION Left   . CORONARY ARTERY BYPASS GRAFT N/A 06/02/2017   Procedure: CORONARY ARTERY BYPASS GRAFTING (CABG) x3 Using Left Internal Mammary Artery and Right Leg Greater Saphenous Vein harvested endoscopically;   Surgeon: Kerin Perna, MD;  Location: [facility] OR;  Service: Open Heart Surgery;  Laterality: N/A;  . ESOPHAGOGASTRODUODENOSCOPY N/A 10/23/2014   Procedure: ESOPHAGOGASTRODUODENOSCOPY (EGD);  Surgeon: Bernette Redbird, MD;  Location: [facility] ENDOSCOPY;  Service: Endoscopy;  Laterality: N/A;  . ESOPHAGOGASTRODUODENOSCOPY (EGD) WITH PROPOFOL N/A 02/13/2018   Procedure: ESOPHAGOGASTRODUODENOSCOPY (EGD) WITH PROPOFOL;  Surgeon: Kerin Salen, MD;  Location: [facility] ENDOSCOPY;  Service: Gastroenterology;  Laterality: N/A;  . FOREIGN BODY REMOVAL  02/13/2018   Procedure: FOREIGN BODY REMOVAL;  Surgeon: Kerin Salen, MD;  Location: [facility] ENDOSCOPY;  Service: Gastroenterology;;  . Rafael Bihari EXCHANGE Right 10/24/2017   Procedure: GAS/FLUID EXCHANGE;  Surgeon: Carmela Rima, MD;  Location: [facility] OR;  Service: Ophthalmology;  Laterality: Right;  SF6  . HEMORRHOID SURGERY    . LEFT HEART CATH AND CORONARY ANGIOGRAPHY N/A 05/30/2017   Procedure: LEFT HEART CATH AND CORONARY ANGIOGRAPHY;  Surgeon: [name], MD;  Location: [facility] INVASIVE CV LAB;  Service: Cardiovascular;  Laterality: N/A;  . PARS PLANA VITRECTOMY Right 10/24/2017   Procedure: PARS PLANA VITRECTOMY WITH 25 GAUGE;  Surgeon: Carmela Rima, MD;  Location: [facility] OR;  Service: Ophthalmology;  Laterality: Right;  . PHOTOCOAGULATION Right 10/24/2017   Procedure: PHOTOCOAGULATION;  Surgeon: Carmela Rima, MD;  Location: [facility] OR;  Service: Ophthalmology;  Laterality: Right;  . REFRACTIVE SURGERY Right   . TEE WITHOUT CARDIOVERSION N/A 06/02/2017   Procedure: TRANSESOPHAGEAL ECHOCARDIOGRAM (TEE);  Surgeon: Donata Clay, Theron Arista, MD;  Location: [facility] OR;  Service: Open Heart Surgery;  Laterality: N/A;     Medications Prior to Admission: Prior to Admission medications   Medication Sig Start Date End Date Taking? Authorizing Provider  amLODipine (NORVASC) 10 MG tablet Take 20 mg by mouth daily.   Yes [provider]  atorvastatin (LIPITOR) 20 MG tablet Take 20 mg by mouth daily.    Yes [provider]  carvedilol (COREG) 6.25 MG tablet Take 1 tablet (6.25 mg total) by mouth 2 (two) times daily. 02/01/19  Yes Graciella Freer, PA-C  furosemide (LASIX) 20 MG tablet Take 1 tablet (20 mg total) by mouth daily. 02/01/19  Yes Lyn Records, MD  levothyroxine (SYNTHROID, LEVOTHROID) 112 MCG tablet Take 112 mcg by mouth daily before breakfast.   Yes [provider]  pantoprazole (PROTONIX) 40 MG tablet Take 40 mg by mouth 2 (two) times daily.  04/29/18  Yes [provider]     Allergies:    Allergies  Allergen Reactions  . Lisinopril Cough    Social History:   Social History   Socioeconomic History  . Marital status: Married    Spouse name: Not on file  . Number of children: Not on file  . Years of education: Not on file  . Highest education level: Not on file  Occupational History  . Occupation: retired  Tobacco Use  . Smoking status: Never Smoker  . Smokeless tobacco: Never Used  Substance and Sexual Activity  . Alcohol use: Yes    Alcohol/week: 4.0 standard drinks    Types: 4 Cans of beer per week  . Drug use: No  . Sexual activity: Not on file  Other Topics Concern  . Not on file  Social History Narrative  . Not on file   Social Determinants of Health   Financial Resource Strain:   . Difficulty of Paying Living Expenses:   Food Insecurity:   . Worried About Programme researcher, broadcasting/film/video in the Last Year:   . Barista in the Last Year:   Transportation Needs:   . Freight forwarder (Medical):   Marland Kitchen Lack of Transportation (Non-Medical):   Physical Activity:   . Days of Exercise per Week:   . Minutes of Exercise per Session:   Stress:   . Feeling of Stress :   Social Connections:   . Frequency of Communication with Friends and Family:   . Frequency of Social Gatherings with Friends and Family:   . Attends Religious Services:   . Active Member of Clubs or Organizations:   . Attends Banker  Meetings:   Marland Kitchen Marital Status:   Intimate Partner Violence:   . Fear of Current or Ex-Partner:   . Emotionally Abused:   Marland Kitchen Physically Abused:   . Sexually Abused:     Family History:   The patient's family history includes Healthy in his brother; Pancreatic cancer in his mother.    ROS:  Please see the history of present illness.  All other ROS reviewed and negative.     Physical Exam/Data:   Vitals:   05/15/19 1615 05/15/19 1630 05/15/19 1645 05/15/19 1700  BP: (!) 167/79 (!) 178/99 (!) 172/81 (!) 166/88  Pulse: 82 92 (!) 130 (!) 125  Resp:      Temp:      TempSrc:      SpO2: 98% 96% 100% 98%  Weight:      Height:       No intake or output data in the 24 hours ending 05/15/19 1727 Last 3 Weights 05/15/2019 02/23/2019 01/15/2019  Weight (lbs) 149 lb 14.6 oz 150 lb 155 lb  Weight (kg) 68 kg 68.04 kg 70.308 kg     Body mass index is 22.79 kg/m.  General:  Well nourished, well developed, in no acute distress HEENT: normal,  small laceration occipital area Lymph: no adenopathy Neck: no JVD Endocrine:  No thryomegaly Vascular: No carotid bruits; FA pulses 2+ bilaterally without bruits  Cardiac:  normal S1, S2; RRR; no murmur  Lungs:  clear to auscultation bilaterally, no wheezing, rhonchi or rales  Abd: soft, nontender, no hepatomegaly  Ext: no edema Musculoskeletal:  No deformities, BUE and BLE strength normal and equal Skin: warm and dry  Neuro:  CNs 2-12 intact, no focal abnormalities noted Psych:  Normal affect    EKG: Several ECGs performed today are reviewed above.  Relevant CV Studies: Cardiac catheterization 06/05/17 1. Critical stenosis at the ostium of the LAD, large vessel, appears to be a suitable target for grafting 2. Severe stenosis of the first OM branch of the circumflex 3. Patent RCA, unable to selectively engage, suspected codominant vessel 4. Mild segmental LV contraction abnormality with LVEF estimated at 50-55%  Echocardiogram postCABG  June 05, 2017 - Procedure narrative: Transthoracic echocardiography. Image  quality was poor. The study was technically difficult, as a  result of poor acoustic windows and poor sound wave transmission.  - Left ventricle: The cavity size was normal. Wall thickness was  increased in a pattern of moderate LVH. Systolic function was  normal. The estimated ejection fraction was in the range of 60%  to 65%.  Incoordinate septal motion. Doppler parameters are  consistent with abnormal left ventricular relaxation (grade 1  diastolic dysfunction). The E/e&' ratio is >15, suggesting  elevated LV filling pressure.  - Aortic valve: Poorly visualized. There was trivial regurgitation.  - Mitral valve: Mildly thickened leaflets . There was trivial  regurgitation.  - Left atrium: The atrium was normal in size.    Laboratory Data:  High Sensitivity Troponin:   Recent Labs  Lab 05/15/19 1038 05/15/19 1243  TROPONINIHS 9 9      Chemistry Recent Labs  Lab 05/15/19 1038  NA 138  K 4.3  CL 102  CO2 26  GLUCOSE 115*  BUN 15  CREATININE 1.32*  CALCIUM 8.9  GFRNONAA 50*  GFRAA 58*  ANIONGAP 10    Recent Labs  Lab 05/15/19 1038  PROT 6.9  ALBUMIN 3.7  AST 24  ALT 20  ALKPHOS 107  BILITOT 1.2   Hematology Recent Labs  Lab 05/15/19 1038  WBC 5.8  RBC 4.48  HGB 12.8*  HCT 40.5  MCV 90.4  MCH 28.6  MCHC 31.6  RDW 16.5*  PLT 77*   BNPNo results for input(s): BNP, PROBNP in the last 168 hours.  DDimer No results for input(s): DDIMER in the last 168 hours.   Radiology/Studies:  DG Pelvis 1-2 Views  Result Date: 05/15/2019 CLINICAL DATA:  Syncope with fall today.  Buttock region pain. EXAM: PELVIS - 1-2 VIEW COMPARISON:  None. FINDINGS: No fracture or bone lesion. Bilateral superolateral hip joint space narrowing. Small marginal osteophytes from the bases of the femoral heads. SI joints and symphysis pubis normally spaced and aligned. Skeletal structures are  demineralized. Soft tissues are unremarkable. IMPRESSION: 1. No fracture or acute finding. 2. Bilateral hip joint degenerative change. Electronically Signed   By: Amie Portland M.D.   On: 05/15/2019 13:52   DG Shoulder Right  Result Date: 05/15/2019 CLINICAL DATA:  Syncope with fall today.  Right shoulder pain. EXAM: RIGHT SHOULDER - 2+ VIEW COMPARISON:  None. FINDINGS: No fracture, bone lesion or dislocation. Mild narrowing of the inferior glenohumeral joint with small inferior humeral head marginal osteophytes. Narrowed AC joint with more prominent osteophytes, particularly superiorly. Soft tissues are unremarkable. IMPRESSION: 1. No fracture or acute finding. 2. Mild glenohumeral joint arthropathic changes. 3. Moderate AC joint osteoarthritis. Electronically Signed   By: Amie Portland M.D.   On: 05/15/2019 13:50   CT Head Wo Contrast  Result Date: 05/15/2019 CLINICAL DATA:  Syncope and head trauma EXAM: CT HEAD WITHOUT CONTRAST CT CERVICAL SPINE WITHOUT CONTRAST TECHNIQUE: Multidetector CT imaging of the head and cervical spine was performed following the standard protocol without intravenous contrast. Multiplanar CT image reconstructions of the cervical spine were also generated. COMPARISON:  None. FINDINGS: CT HEAD FINDINGS Brain: No evidence of acute infarction, hemorrhage, hydrocephalus, extra-axial collection or mass lesion/mass effect. Vascular: No hyperdense vessel or unexpected calcification. Skull: Normal. Negative for fracture or focal lesion. Sinuses/Orbits: No acute finding. CT CERVICAL SPINE FINDINGS Alignment: Straightening of the cervical spine Skull base and vertebrae: No acute fracture. No primary bone lesion or focal pathologic process. Soft tissues and spinal canal: No prevertebral fluid or swelling. No visible canal hematoma. Disc levels: Diffuse degenerative disc narrowing and facet spurring with C2-3 ankylosis. Upper chest: Negative IMPRESSION: No evidence of acute intracranial or  cervical spine injury. Electronically Signed   By: Marnee Spring M.D.   On: 05/15/2019 12:19   CT Cervical Spine Wo Contrast  Result Date: 05/15/2019 CLINICAL DATA:  Syncope and head trauma EXAM: CT HEAD WITHOUT CONTRAST CT CERVICAL SPINE WITHOUT CONTRAST TECHNIQUE: Multidetector CT imaging of the head and cervical spine was performed following the standard protocol without intravenous contrast. Multiplanar CT image reconstructions of the cervical spine were also generated. COMPARISON:  None. FINDINGS: CT HEAD FINDINGS Brain: No evidence of acute infarction, hemorrhage, hydrocephalus, extra-axial collection or mass lesion/mass effect. Vascular: No hyperdense vessel or unexpected calcification. Skull: Normal. Negative for fracture or focal lesion. Sinuses/Orbits: No acute finding. CT CERVICAL SPINE FINDINGS Alignment: Straightening of the cervical spine Skull base and vertebrae: No acute fracture. No primary bone lesion or focal pathologic process. Soft tissues and spinal canal: No prevertebral fluid or swelling. No visible canal hematoma. Disc levels: Diffuse degenerative disc narrowing and facet spurring with C2-3 ankylosis. Upper chest: Negative IMPRESSION: No evidence of acute intracranial or cervical spine injury. Electronically Signed   By: Monte Fantasia M.D.   On: 05/15/2019 12:19   {  Assessment and Plan:   1. Tachycardia-bradycardia syndrome: his rhythm promptly changes from ectopic atrial tachycardia at 130 bpm to normal sinus rhythm and periods of sinus bradycardia in the 30s.  His syncopal events do not sound consistent with orthostatic hypotension.  I think there is sufficient evidence to suggest that his syncope is related to sudden bradycardia/pauses.  Discussed dual-chamber permanent pacemaker implantation and reviewed the benefits and potential risks (including but not limited to infection, reoperation for lead dislodgment, pneumothorax, perforation, tamponade).  She is willing to go  ahead with pacemaker implantation. 2. PAT: We will hold the carvedilol until the pacemaker is in place.  Watch for beta-blocker rebound.  Plan to restart the beta-blocker once the pacemaker is in.  Check TSH. 3. HTN: We will hold the carvedilol but continue amlodipine and his other medications.  Can use ARB if blood pressure becomes excessively elevated (history of cough with lisinopril). 4. CAD: He underwent bypass surgery less than 12 months ago.  He does not have angina pectoris.  Normal left ventricular systolic function. 5. History of atrial flutter status post cavotricuspid isthmus ablation: No recurrence since the procedure including on event monitoring, anticoagulants have been stopped.  Severity of Illness: The appropriate patient status for this patient is INPATIENT. Inpatient status is judged to be reasonable and necessary in order to provide the required intensity of service to ensure the patient's safety. The patient's presenting symptoms, physical exam findings, and initial radiographic and laboratory data in the context of their chronic comorbidities is felt to place them at high risk for further clinical deterioration. Furthermore, it is not anticipated that the patient will be medically stable for discharge from the hospital within 2 midnights of admission. The following factors support the patient status of inpatient.   " The patient's presenting symptoms include syncope, head injury. " The worrisome physical exam findings include tachycardia and severe bradycardia. " The initial radiographic and laboratory data are worrisome because of severe bradycardia on telemetry. " The chronic co-morbidities include CAD, carotid disease   * I certify that at the point of admission it is my clinical judgment that the patient will require inpatient hospital care spanning beyond 2 midnights from the point of admission due to high intensity of service, high risk for further deterioration and high  frequency of surveillance required.*    For questions or updates, please contact [facility] Please consult [email or web address] for contact info under        Signed, Sanda Klein, MD  05/15/2019 5:27 PM

## 2019-05-16 ENCOUNTER — Encounter (HOSPITAL_COMMUNITY): Payer: Self-pay | Admitting: Cardiovascular Disease

## 2019-05-16 LAB — BASIC METABOLIC PANEL
Anion gap: 13 (ref 5–15)
BUN: 14 mg/dL (ref 8–23)
CO2: 24 mmol/L (ref 22–32)
Calcium: 8.7 mg/dL — ABNORMAL LOW (ref 8.9–10.3)
Chloride: 101 mmol/L (ref 98–111)
Creatinine, Ser: 1.08 mg/dL (ref 0.61–1.24)
GFR calc Af Amer: >60 mL/min (ref >60)
GFR calc non Af Amer: >60 mL/min (ref >60)
Glucose, Bld: 110 mg/dL — ABNORMAL HIGH (ref 70–99)
Potassium: 3.3 mmol/L — ABNORMAL LOW (ref 3.5–5.1)
Sodium: 138 mmol/L (ref 135–145)

## 2019-05-16 LAB — CBC
HCT: 42.5 % (ref 39.0–52.0)
Hemoglobin: 14 g/dL (ref 13.0–17.0)
MCH: 28.5 pg (ref 26.0–34.0)
MCHC: 32.9 g/dL (ref 30.0–36.0)
MCV: 86.4 fL (ref 80.0–100.0)
Platelets: 79 10*3/uL — ABNORMAL LOW (ref 150–400)
RBC: 4.92 MIL/uL (ref 4.22–5.81)
RDW: 16.3 % — ABNORMAL HIGH (ref 11.5–15.5)
WBC: 7.7 10*3/uL (ref 4.0–10.5)
nRBC: 0 % (ref 0.0–0.2)

## 2019-05-16 NOTE — Consult Note (Signed)
Cardiology Consultation:   Patient ID: [name] MRN: [id number]; DOB: 07/01/1935  Admit date: 05/15/2019 Date of Consult: 05/16/2019  Primary Care Provider: Georgann Housekeeper, MD Primary Cardiologist: Lesleigh Noe, MD  Primary Electrophysiologist:  Graciela Husbands   Patient Profile:   Wayne Mitchell is a 84 y.o. male with a hx of coronary artery disease, hypertension, atrial flutter status post ablation who is being seen today for the evaluation of syncope at the request of Mihai Croitrou.  History of Present Illness:   Mr. Wayne Mitchell has a history significant for coronary artery disease status post CABG, hypertension, and atrial flutter status post ablation.  He has had 3 syncopal episodes over the past few days.  His first episode occurred when he walked into the house from the garage.  He says that he took a step up into the house, was talking with his wife, and woke up on the floor.  He did not have an injury at the time.  He checked his blood pressure several times which was normal.  He had another episode when he was standing at the kitchen sink peeling potatoes.  He checked his blood pressure and noted that his heart rate was in the 40s.  He had a third episode while driving.  He says that he got dizzy, and the car was pulled over when the dizziness resolved.  He is unsure if he lost consciousness at the time.  EMS tracings show marked sinus bradycardia with post-PVC pauses as noted in the ER physician note.  In the emergency room, he continued to have episodes of what appears to be an atrial tachycardia followed by postconversion pauses of 2 seconds.  Overnight last night, he continued to have postconversion pauses of up to 3.5 to 4 seconds.  He states that he is unaware of his episodes of tachycardia.  Prior to this he had not had any further episodes of syncope.  His carvedilol was held on admission.   Past Medical History:  Diagnosis Date  . Carotid artery disease (HCC)   . Colon  polyp    small  . Coronary artery disease   . Coronary artery disease involving native coronary artery of native heart with unstable angina pectoris (HCC) 05/30/2017   3V CABG 06/02/17  . HTN (hypertension)   . Hx of CABG   . Hypothyroidism   . Syncope   . Syncope   . Thrombocytopenia (HCC)     Past Surgical History:  Procedure Laterality Date  . A-FLUTTER ABLATION N/A 01/15/2019   Procedure: A-FLUTTER ABLATION;  Surgeon: Duke Salvia, MD;  Location: [facility] INVASIVE CV LAB;  Service: Cardiovascular;  Laterality: N/A;  . BALLOON DILATION N/A 02/13/2018   Procedure: BALLOON DILATION;  Surgeon: Kerin Salen, MD;  Location: [facility] ENDOSCOPY;  Service: Gastroenterology;  Laterality: N/A;  . BIOPSY  02/13/2018   Procedure: BIOPSY;  Surgeon: Kerin Salen, MD;  Location: [facility] ENDOSCOPY;  Service: Gastroenterology;;  . CARDIOVERSION N/A 12/21/2018   Procedure: CARDIOVERSION;  Surgeon: Chrystie Nose, MD;  Location: [facility] ENDOSCOPY;  Service: Cardiovascular;  Laterality: N/A;  . CATARACT EXTRACTION Left   . CORONARY ARTERY BYPASS GRAFT N/A 06/02/2017   Procedure: CORONARY ARTERY BYPASS GRAFTING (CABG) x3 Using Left Internal Mammary Artery and Right Leg Greater Saphenous Vein harvested endoscopically;  Surgeon: Kerin Perna, MD;  Location: [facility] OR;  Service: Open Heart Surgery;  Laterality: N/A;  . ESOPHAGOGASTRODUODENOSCOPY N/A 10/23/2014   Procedure: ESOPHAGOGASTRODUODENOSCOPY (EGD);  Surgeon: Bernette Redbird, MD;  Location: [facility] ENDOSCOPY;  Service: Endoscopy;  Laterality: N/A;  . ESOPHAGOGASTRODUODENOSCOPY (EGD) WITH PROPOFOL N/A 02/13/2018   Procedure: ESOPHAGOGASTRODUODENOSCOPY (EGD) WITH PROPOFOL;  Surgeon: Kerin Salen, MD;  Location: [facility] ENDOSCOPY;  Service: Gastroenterology;  Laterality: N/A;  . FOREIGN BODY REMOVAL  02/13/2018   Procedure: FOREIGN BODY REMOVAL;  Surgeon: Kerin Salen, MD;  Location: [facility] ENDOSCOPY;  Service: Gastroenterology;;  . Rafael Bihari EXCHANGE Right 10/24/2017   Procedure: GAS/FLUID  EXCHANGE;  Surgeon: Carmela Rima, MD;  Location: [facility] OR;  Service: Ophthalmology;  Laterality: Right;  SF6  . HEMORRHOID SURGERY    . LEFT HEART CATH AND CORONARY ANGIOGRAPHY N/A 05/30/2017   Procedure: LEFT HEART CATH AND CORONARY ANGIOGRAPHY;  Surgeon: [name], MD;  Location[facility] INVASIVE CV LAB;  Service: Cardiovascular;  Laterality: N/A;  . PARS PLANA VITRECTOMY Right 10/24/2017   Procedure: PARS PLANA VITRECTOMY WITH 25 GAUGE;  Surgeon: Carmela Rima, MD;  Location: [facility] OR;  Service: Ophthalmology;  Laterality: Right;  . PHOTOCOAGULATION Right 10/24/2017   Procedure: PHOTOCOAGULATION;  Surgeon: Carmela Rima, MD;  Location: [facility] OR;  Service: Ophthalmology;  Laterality: Right;  . REFRACTIVE SURGERY Right   . TEE WITHOUT CARDIOVERSION N/A 06/02/2017   Procedure: TRANSESOPHAGEAL ECHOCARDIOGRAM (TEE);  Surgeon: Donata Clay, Theron Arista, MD;  Location: [facility] OR;  Service: Open Heart Surgery;  Laterality: N/A;     Home Medications:  Prior to Admission medications   Medication Sig Start Date End Date Taking? Authorizing Provider  amLODipine (NORVASC) 10 MG tablet Take 20 mg by mouth daily.   Yes [provider]  atorvastatin (LIPITOR) 20 MG tablet Take 20 mg by mouth daily.   Yes [provider]  carvedilol (COREG) 6.25 MG tablet Take 1 tablet (6.25 mg total) by mouth 2 (two) times daily. 02/01/19  Yes Graciella Freer, PA-C  furosemide (LASIX) 20 MG tablet Take 1 tablet (20 mg total) by mouth daily. 02/01/19  Yes Lyn Records, MD  levothyroxine (SYNTHROID, LEVOTHROID) 112 MCG tablet Take 112 mcg by mouth daily before breakfast.   Yes [provider]  pantoprazole (PROTONIX) 40 MG tablet Take 40 mg by mouth 2 (two) times daily.  04/29/18  Yes [provider]    Inpatient Medications: Scheduled Meds: . amLODipine  10 mg Oral Daily  . atorvastatin  20 mg Oral Daily  . furosemide  20 mg Oral Daily  . heparin  5,000 Units Subcutaneous Q8H  .  levothyroxine  112 mcg Oral QAC breakfast  . lidocaine (PF)  5 mL Infiltration Once  . pantoprazole  40 mg Oral BID   Continuous Infusions:  PRN Meds: acetaminophen, ondansetron (ZOFRAN) IV  Allergies:    Allergies  Allergen Reactions  . Lisinopril Cough    Social History:   Social History   Socioeconomic History  . Marital status: Married    Spouse name: Not on file  . Number of children: Not on file  . Years of education: Not on file  . Highest education level: Not on file  Occupational History  . Occupation: retired  Tobacco Use  . Smoking status: Never Smoker  . Smokeless tobacco: Never Used  Substance and Sexual Activity  . Alcohol use: Yes    Alcohol/week: 4.0 standard drinks    Types: 4 Cans of beer per week  . Drug use: No  . Sexual activity: Not on file  Other Topics Concern  . Not on file  Social History Narrative  . Not on file   Social Determinants of Health  Financial Resource Strain:   . Difficulty of Paying Living Expenses:   Food Insecurity:   . Worried About Charity fundraiser in the Last Year:   . Arboriculturist in the Last Year:   Transportation Needs:   . Film/video editor (Medical):   Marland Kitchen Lack of Transportation (Non-Medical):   Physical Activity:   . Days of Exercise per Week:   . Minutes of Exercise per Session:   Stress:   . Feeling of Stress :   Social Connections:   . Frequency of Communication with Friends and Family:   . Frequency of Social Gatherings with Friends and Family:   . Attends Religious Services:   . Active Member of Clubs or Organizations:   . Attends Archivist Meetings:   Marland Kitchen Marital Status:   Intimate Partner Violence:   . Fear of Current or Ex-Partner:   . Emotionally Abused:   Marland Kitchen Physically Abused:   . Sexually Abused:     Family History:    Family History  Problem Relation Age of Onset  . Pancreatic cancer Mother   . Healthy Brother      ROS:  Please see the history of present  illness.   All other ROS reviewed and negative.     Physical Exam/Data:   Vitals:   05/16/19 0007 05/16/19 0121 05/16/19 0408 05/16/19 0800  BP:    (!) 143/76  Pulse:  85  74  Resp:    16  Temp: 97.7 F (36.5 C)  97.9 F (36.6 C)   TempSrc: Oral  Oral   SpO2:    95%  Weight:   67.8 kg   Height:        Intake/Output Summary (Last 24 hours) at 05/16/2019 0926 Last data filed at 05/16/2019 0900 Gross per 24 hour  Intake 480 ml  Output 400 ml  Net 80 ml   Last 3 Weights 05/16/2019 05/15/2019 05/15/2019  Weight (lbs) 149 lb 7.6 oz 149 lb 3.2 oz 149 lb 14.6 oz  Weight (kg) 67.8 kg 67.677 kg 68 kg     Body mass index is 22.73 kg/m.  General:  Well nourished, well developed, in no acute distress HEENT: normal Lymph: no adenopathy Neck: no JVD Endocrine:  No thryomegaly Vascular: No carotid bruits; FA pulses 2+ bilaterally without bruits  Cardiac:  normal S1, S2; RRR; no murmur  Lungs:  clear to auscultation bilaterally, no wheezing, rhonchi or rales  Abd: soft, nontender, no hepatomegaly  Ext: no edema Musculoskeletal:  No deformities, BUE and BLE strength normal and equal Skin: warm and dry  Neuro:  CNs 2-12 intact, no focal abnormalities noted Psych:  Normal affect   EKG:  The EKG was personally reviewed and demonstrates: Sinus rhythm, right bundle branch block, PVC Telemetry:  Telemetry was personally reviewed and demonstrates: Sinus rhythm with episodes of atrial tachycardia and postconversion pauses  Relevant CV Studies: TTE 06/05/2017 - Left ventricle: The cavity size was normal. Wall thickness was  increased in a pattern of moderate LVH. Systolic function was  normal. The estimated ejection fraction was in the range of 60%  to 65%. Incoordinate septal motion. Doppler parameters are  consistent with abnormal left ventricular relaxation (grade 1  diastolic dysfunction). The E/e&' ratio is >15, suggesting  elevated LV filling pressure.  - Aortic valve: Poorly  visualized. There was trivial regurgitation.  - Mitral valve: Mildly thickened leaflets . There was trivial  regurgitation.  - Left atrium: The atrium was  normal in size.   Laboratory Data:  High Sensitivity Troponin:   Recent Labs  Lab 05/15/19 1038 05/15/19 1243  TROPONINIHS 9 9     Chemistry Recent Labs  Lab 05/15/19 1038 05/16/19 0422  NA 138 138  K 4.3 3.3*  CL 102 101  CO2 26 24  GLUCOSE 115* 110*  BUN 15 14  CREATININE 1.32* 1.08  CALCIUM 8.9 8.7*  GFRNONAA 50* >60  GFRAA 58* >60  ANIONGAP 10 13    Recent Labs  Lab 05/15/19 1038  PROT 6.9  ALBUMIN 3.7  AST 24  ALT 20  ALKPHOS 107  BILITOT 1.2   Hematology Recent Labs  Lab 05/15/19 1038 05/16/19 0422  WBC 5.8 7.7  RBC 4.48 4.92  HGB 12.8* 14.0  HCT 40.5 42.5  MCV 90.4 86.4  MCH 28.6 28.5  MCHC 31.6 32.9  RDW 16.5* 16.3*  PLT 77* 79*   BNPNo results for input(s): BNP, PROBNP in the last 168 hours.  DDimer No results for input(s): DDIMER in the last 168 hours.   Radiology/Studies:  DG Pelvis 1-2 Views  Result Date: 05/15/2019 CLINICAL DATA:  Syncope with fall today.  Buttock region pain. EXAM: PELVIS - 1-2 VIEW COMPARISON:  None. FINDINGS: No fracture or bone lesion. Bilateral superolateral hip joint space narrowing. Small marginal osteophytes from the bases of the femoral heads. SI joints and symphysis pubis normally spaced and aligned. Skeletal structures are demineralized. Soft tissues are unremarkable. IMPRESSION: 1. No fracture or acute finding. 2. Bilateral hip joint degenerative change. Electronically Signed   By: Amie Portland M.D.   On: 05/15/2019 13:52   DG Shoulder Right  Result Date: 05/15/2019 CLINICAL DATA:  Syncope with fall today.  Right shoulder pain. EXAM: RIGHT SHOULDER - 2+ VIEW COMPARISON:  None. FINDINGS: No fracture, bone lesion or dislocation. Mild narrowing of the inferior glenohumeral joint with small inferior humeral head marginal osteophytes. Narrowed AC joint with  more prominent osteophytes, particularly superiorly. Soft tissues are unremarkable. IMPRESSION: 1. No fracture or acute finding. 2. Mild glenohumeral joint arthropathic changes. 3. Moderate AC joint osteoarthritis. Electronically Signed   By: Amie Portland M.D.   On: 05/15/2019 13:50   CT Head Wo Contrast  Result Date: 05/15/2019 CLINICAL DATA:  Syncope and head trauma EXAM: CT HEAD WITHOUT CONTRAST CT CERVICAL SPINE WITHOUT CONTRAST TECHNIQUE: Multidetector CT imaging of the head and cervical spine was performed following the standard protocol without intravenous contrast. Multiplanar CT image reconstructions of the cervical spine were also generated. COMPARISON:  None. FINDINGS: CT HEAD FINDINGS Brain: No evidence of acute infarction, hemorrhage, hydrocephalus, extra-axial collection or mass lesion/mass effect. Vascular: No hyperdense vessel or unexpected calcification. Skull: Normal. Negative for fracture or focal lesion. Sinuses/Orbits: No acute finding. CT CERVICAL SPINE FINDINGS Alignment: Straightening of the cervical spine Skull base and vertebrae: No acute fracture. No primary bone lesion or focal pathologic process. Soft tissues and spinal canal: No prevertebral fluid or swelling. No visible canal hematoma. Disc levels: Diffuse degenerative disc narrowing and facet spurring with C2-3 ankylosis. Upper chest: Negative IMPRESSION: No evidence of acute intracranial or cervical spine injury. Electronically Signed   By: Marnee Spring M.D.   On: 05/15/2019 12:19   CT Cervical Spine Wo Contrast  Result Date: 05/15/2019 CLINICAL DATA:  Syncope and head trauma EXAM: CT HEAD WITHOUT CONTRAST CT CERVICAL SPINE WITHOUT CONTRAST TECHNIQUE: Multidetector CT imaging of the head and cervical spine was performed following the standard protocol without intravenous contrast. Multiplanar CT image reconstructions  of the cervical spine were also generated. COMPARISON:  None. FINDINGS: CT HEAD FINDINGS Brain: No evidence  of acute infarction, hemorrhage, hydrocephalus, extra-axial collection or mass lesion/mass effect. Vascular: No hyperdense vessel or unexpected calcification. Skull: Normal. Negative for fracture or focal lesion. Sinuses/Orbits: No acute finding. CT CERVICAL SPINE FINDINGS Alignment: Straightening of the cervical spine Skull base and vertebrae: No acute fracture. No primary bone lesion or focal pathologic process. Soft tissues and spinal canal: No prevertebral fluid or swelling. No visible canal hematoma. Disc levels: Diffuse degenerative disc narrowing and facet spurring with C2-3 ankylosis. Upper chest: Negative IMPRESSION: No evidence of acute intracranial or cervical spine injury. Electronically Signed   By: Marnee Spring M.D.   On: 05/15/2019 12:19      Assessment and Plan:   1. Tachybradycardia syndrome with syncope: Patient was on carvedilol.  He continues to have episodes of tachycardia with pauses off of carvedilol.  It is unclear to me whether or not he Jenalee Trevizo continue to have issues.  He would be agreeable to pacemaker implant likely tomorrow.  As he is minimally symptomatic, would potentially discharge on no rate control with cardiac monitoring, though pacemaker is certainly indicated as well.  I have told him that we Travius Crochet continue to watch on telemetry over the day today.   2. Paroxysmal atrial tachycardia: Carvedilol on hold.  Patient is minimally symptomatic. 3. Hypertension: Mildly elevated.  Winda Summerall potentially need medication adjustments at discharge. 4. Coronary artery disease: Bypass surgery less than 12 months ago.  No current chest pain. 5. Typical atrial flutter: Status post ablation.  No recurrences since that time.  Currently not anticoagulated.   For questions or updates, please contact [facility] Please consult [email or web address] for contact info under     Signed, Darrek Leasure Jorja Loa, MD  05/16/2019 9:26 AM

## 2019-05-16 NOTE — Progress Notes (Signed)
Monitor tech notified that patient had 14 beat run of SVT. Patient sitting in bed denies symptoms, BP 158/72, HR 79 SR. Monitor tech notified patient had 1 minute of ST 30 minutes later. Patient sitting in bed and denies symptoms, BP 147/75, HR 78.  Dr. Daphine Deutscher notified, no new orders at this time, will continue to monitor.

## 2019-05-17 ENCOUNTER — Encounter (HOSPITAL_COMMUNITY)
Admission: EM | Disposition: A | Discharge status: Home / Self Care | Payer: Self-pay | Source: Home / Self Care | Attending: Cardiovascular Disease

## 2019-05-17 HISTORY — PX: PACEMAKER IMPLANT: EP1218

## 2019-05-17 LAB — SURGICAL PCR SCREEN
MRSA, PCR: NEGATIVE
Staphylococcus aureus: NEGATIVE

## 2019-05-17 SURGERY — PACEMAKER IMPLANT

## 2019-05-17 MED ORDER — LIDOCAINE HCL (PF) 1 % IJ SOLN
INTRAMUSCULAR | Status: DC | PRN
  Administered 2019-05-17: 60 mL

## 2019-05-17 MED ORDER — LIDOCAINE HCL 1 % IJ SOLN
INTRAMUSCULAR | Status: AC
  Filled 2019-05-17: qty 60

## 2019-05-17 MED ORDER — ONDANSETRON HCL 4 MG/2ML IJ SOLN: 4.0000 mg | Freq: Four times a day (QID) | INTRAMUSCULAR | Status: DC | PRN

## 2019-05-17 MED ORDER — MIDAZOLAM HCL 5 MG/5ML IJ SOLN
INTRAMUSCULAR | Status: DC | PRN
  Administered 2019-05-17 (×2): 1 mg via INTRAVENOUS

## 2019-05-17 MED ORDER — CEFAZOLIN SODIUM-DEXTROSE 2-4 GM/100ML-% IV SOLN
2.0000 g | INTRAVENOUS | Status: AC
  Administered 2019-05-17: 16:00:00 2 g via INTRAVENOUS

## 2019-05-17 MED ORDER — HEPARIN (PORCINE) IN NACL 1000-0.9 UT/500ML-% IV SOLN
INTRAVENOUS | Status: AC
  Filled 2019-05-17: qty 500

## 2019-05-17 MED ORDER — MIDAZOLAM HCL 5 MG/5ML IJ SOLN
INTRAMUSCULAR | Status: AC
  Filled 2019-05-17: qty 5

## 2019-05-17 MED ORDER — FENTANYL CITRATE (PF) 100 MCG/2ML IJ SOLN
INTRAMUSCULAR | Status: DC | PRN
  Administered 2019-05-17: 25 ug via INTRAVENOUS

## 2019-05-17 MED ORDER — ACETAMINOPHEN 325 MG PO TABS: 325.0000 mg | ORAL_TABLET | ORAL | Status: DC | PRN

## 2019-05-17 MED ORDER — HEPARIN (PORCINE) IN NACL 1000-0.9 UT/500ML-% IV SOLN
INTRAVENOUS | Status: DC | PRN
  Administered 2019-05-17: 500 mL

## 2019-05-17 MED ORDER — FENTANYL CITRATE (PF) 100 MCG/2ML IJ SOLN
INTRAMUSCULAR | Status: AC
  Filled 2019-05-17: qty 2

## 2019-05-17 MED ORDER — SODIUM CHLORIDE 0.9 % IV SOLN
INTRAVENOUS | Status: AC
  Filled 2019-05-17: qty 2

## 2019-05-17 MED ORDER — SODIUM CHLORIDE 0.9 % IV SOLN
80.0000 mg | INTRAVENOUS | Status: AC
  Administered 2019-05-17: 80 mg

## 2019-05-17 MED ORDER — CEFAZOLIN SODIUM-DEXTROSE 2-4 GM/100ML-% IV SOLN
INTRAVENOUS | Status: AC
  Filled 2019-05-17: qty 100

## 2019-05-17 MED ORDER — MUPIROCIN 2 % EX OINT
TOPICAL_OINTMENT | Freq: Two times a day (BID) | CUTANEOUS | Status: DC
  Filled 2019-05-17: qty 22

## 2019-05-17 MED ORDER — CEFAZOLIN SODIUM-DEXTROSE 1-4 GM/50ML-% IV SOLN
1.0000 g | Freq: Four times a day (QID) | INTRAVENOUS | Status: AC
  Administered 2019-05-17 – 2019-05-18 (×3): 1 g via INTRAVENOUS
  Filled 2019-05-17 (×3): qty 50

## 2019-05-17 MED ORDER — SODIUM CHLORIDE 0.9 % IV SOLN: INTRAVENOUS | Status: DC

## 2019-05-17 SURGICAL SUPPLY — 7 items
CABLE SURGICAL S-101-97-12 (CABLE) ×2 IMPLANT
LEAD TENDRIL MRI 52CM LPA1200M (Lead) ×1 IMPLANT
LEAD TENDRIL MRI 58CM LPA1200M (Lead) ×1 IMPLANT
PACEMAKER ASSURITY DR-RF (Pacemaker) ×1 IMPLANT
PAD PRO RADIOLUCENT 2001M-C (PAD) ×2 IMPLANT
SHEATH 8FR PRELUDE SNAP 13 (SHEATH) ×2 IMPLANT
TRAY PACEMAKER INSERTION (PACKS) ×2 IMPLANT

## 2019-05-17 NOTE — Discharge Summary (Addendum)
ELECTROPHYSIOLOGY PROCEDURE DISCHARGE SUMMARY    Patient ID: Wayne Mitchell,  MRN: [id number], DOB/AGE: 04-20-1935 84 y.o.  Admit date: 05/15/2019 Discharge date: 05/18/19   Primary Care Physician: Georgann Housekeeper, MD  Primary Cardiologist: Lesleigh Noe, MD  Electrophysiologist: Dr. Graciela Husbands (PPM by Dr. Ladona Ridgel)  Primary Discharge Diagnosis:  Tachybradycardia syndrome and syncope status post pacemaker implantation this admission  Secondary Discharge Diagnosis:  Paroxysmal atrial tachycardia Atrial flutter s/p ablation 01/2019 HTN CAD s/p CABG  Allergies  Allergen Reactions   Lisinopril Cough     Procedures This Admission:  1.  Implantation of a St. Jude dual chamber PPM on 05/17/2019 by Dr. Ladona Ridgel.  The patient received a St. Jude model number [id number] PPM with model number [id number] right atrial lead and [id number] right ventricular lead. There were no immediate post procedure complications. 2.  CXR on 05/18/19 demonstrated no pneumothorax status post device implantation.   Brief HPI: Wayne Mitchell is a 84 y.o. male was admitted for syncope and electrophysiology team asked to see for consideration of PPM implantation due to paroxysmal atrial tachycardia with post-conversion pauses.  Past medical history includes above.  The patient has had symptomatic bradycardia without reversible causes identified. He is not candidate for AAD other than amio with his CAD and bradycardia, and this still could worsen his bradycardia. Thus, risks, benefits, and alternatives to PPM implantation were reviewed with the patient who wished to proceed.   Hospital Course:  The patient was admitted and underwent implantation of a St. Jude dual chamber PPM with details as outlined above.  He was monitored on telemetry overnight which demonstrated appropriate pacing.  Left chest was without hematoma or ecchymosis.  The device was interrogated and found to be functioning normally.  CXR was obtained and  demonstrated no pneumothorax status post device implantation.  Did show incidental marking that was possible consistent with pulmonary nodule. Repeat CXR showed that ? Nodule was likely shadow/artifact. Wound care, arm mobility, and restrictions were reviewed with the patient.  The patient was examined and considered stable for discharge to home.  BB resumed on discharge.  Regarding blood thinner therapy, they were instructed to start Eliquis on Friday, 05/21/2019. (Tolerated well previously)  Physical Exam: Vitals:   05/17/19 2315 05/18/19 0019 05/18/19 0425 05/18/19 0839  BP: (!) 147/69 (!) 144/71 (!) 146/66 (!) 136/91  Pulse: 73 73 72   Resp: 15 17 18    Temp:  98.6 F (37 C) 98.5 F (36.9 C)   TempSrc:  Oral Oral   SpO2: 95% 95% 96%   Weight:   69 kg   Height:        GEN- The patient is well appearing, alert and oriented x 3 today.   HEENT: normocephalic, atraumatic; sclera clear, conjunctiva pink; hearing intact; oropharynx clear; neck supple, no JVP Lymph- no cervical lymphadenopathy Lungs- Clear to ausculation bilaterally, normal work of breathing.  No wheezes, rales, rhonchi Heart- Regular rate and rhythm, no murmurs, rubs or gallops, PMI not laterally displaced GI- soft, non-tender, non-distended, bowel sounds present, no hepatosplenomegaly Extremities- no clubbing, cyanosis, or edema; DP/PT/radial pulses 2+ bilaterally MS- no significant deformity or atrophy Skin- warm and dry, no rash or lesion, left chest without hematoma/ecchymosis Psych- euthymic mood, full affect Neuro- strength and sensation are intact  Labs:   Lab Results  Component Value Date   WBC 7.7 05/16/2019   HGB 14.0 05/16/2019   HCT 42.5 05/16/2019   MCV 86.4 05/16/2019  PLT 79 (L) 05/16/2019    Recent Labs  Lab 05/15/19 1038 05/15/19 1038 05/16/19 0422  NA 138   < > 138  K 4.3   < > 3.3*  CL 102   < > 101  CO2 26   < > 24  BUN 15   < > 14  CREATININE 1.32*   < > 1.08  CALCIUM 8.9   < > 8.7*   PROT 6.9  --   --   BILITOT 1.2  --   --   ALKPHOS 107  --   --   ALT 20  --   --   AST 24  --   --   GLUCOSE 115*   < > 110*   < > = values in this interval not displayed.    Discharge Medications:  Allergies as of 05/18/2019       Reactions   Lisinopril Cough        Medication List     TAKE these medications    amLODipine 10 MG tablet Commonly known as: NORVASC Take 20 mg by mouth daily.   apixaban 5 MG Tabs tablet Commonly known as: Eliquis Take 1 tablet (5 mg total) by mouth 2 (two) times daily. Start taking on: May 21, 2019   atorvastatin 20 MG tablet Commonly known as: LIPITOR Take 20 mg by mouth daily.   carvedilol 6.25 MG tablet Commonly known as: COREG Take 1 tablet (6.25 mg total) by mouth 2 (two) times daily.   furosemide 20 MG tablet Commonly known as: LASIX Take 1 tablet (20 mg total) by mouth daily.   levothyroxine 112 MCG tablet Commonly known as: SYNTHROID Take 112 mcg by mouth daily before breakfast.   pantoprazole 40 MG tablet Commonly known as: PROTONIX Take 40 mg by mouth 2 (two) times daily.        Disposition:    Follow-up Information     [facility] Follow up on 05/27/2019.   Why: at 1030 for post hospital pacemaker check Contact information: [place] [address] [phone]        Evans Lance, MD Follow up on 08/19/2019.   Specialty: Cardiology Why: at 230 for 3 month post pacemaker check Contact information: [address] [address] [phone]            Duration of Discharge Encounter: Greater than 30 minutes including physician time.  Jacalyn Lefevre, PA-C  05/18/2019 9:35 AM  EP Attending  Patient seen and examined. Agree with the findings as noted above. His PPM interrogation demonstrates normal DDD PM function. CXR looks good. Wound is without hematoma. He will  be discharged home with usual followup.   Mikle Bosworth.D.

## 2019-05-17 NOTE — H&P (View-Only) (Signed)
Electrophysiology Rounding Note  Patient Name: Wayne Mitchell Date of Encounter: 05/17/2019  Electrophysiologist: Dr. Graciela Husbands   Subjective   The patient is doing well today.  At this time, the patient denies chest pain, shortness of breath, or any new concerns.  Inpatient Medications    Scheduled Meds: . amLODipine  10 mg Oral Daily  . atorvastatin  20 mg Oral Daily  . furosemide  20 mg Oral Daily  . heparin  5,000 Units Subcutaneous Q8H  . levothyroxine  112 mcg Oral QAC breakfast  . lidocaine (PF)  5 mL Infiltration Once  . pantoprazole  40 mg Oral BID   Continuous Infusions:  PRN Meds: acetaminophen, ondansetron (ZOFRAN) IV   Vital Signs    Vitals:   05/16/19 1845 05/16/19 2032 05/17/19 0216 05/17/19 0423  BP: 126/65 (!) 146/62  (!) 151/77  Pulse: 82 66 66 72  Resp: 16 19 14 16   Temp:  98.3 F (36.8 C)  97.8 F (36.6 C)  TempSrc:  Oral  Oral  SpO2: 94% 95% 95% 96%  Weight:    64.5 kg  Height:        Intake/Output Summary (Last 24 hours) at 05/17/2019 0744 Last data filed at 05/16/2019 1834 Gross per 24 hour  Intake 600 ml  Output 800 ml  Net -200 ml   Filed Weights   05/15/19 1800 05/16/19 0408 05/17/19 0423  Weight: 67.7 kg 67.8 kg 64.5 kg    Physical Exam    GEN- The patient is well appearing, alert and oriented x 3 today.   Head- normocephalic, atraumatic Eyes-  Sclera clear, conjunctiva pink Ears- hearing intact Oropharynx- clear Neck- supple Lungs- Clear to ausculation bilaterally, normal work of breathing Heart- Regular rate and rhythm, no murmurs, rubs or gallops GI- soft, NT, ND, + BS Extremities- no clubbing, cyanosis, or edema Skin- no rash or lesion Psych- euthymic mood, full affect Neuro- strength and sensation are intact  Labs    CBC Recent Labs    05/15/19 1038 05/16/19 0422  WBC 5.8 7.7  NEUTROABS 3.8  --   HGB 12.8* 14.0  HCT 40.5 42.5  MCV 90.4 86.4  PLT 77* 79*   Basic Metabolic Panel Recent Labs     05/15/19 1038 05/16/19 0422  NA 138 138  K 4.3 3.3*  CL 102 101  CO2 26 24  GLUCOSE 115* 110*  BUN 15 14  CREATININE 1.32* 1.08  CALCIUM 8.9 8.7*   Liver Function Tests Recent Labs    05/15/19 1038  AST 24  ALT 20  ALKPHOS 107  BILITOT 1.2  PROT 6.9  ALBUMIN 3.7   No results for input(s): LIPASE, AMYLASE in the last 72 hours. Cardiac Enzymes No results for input(s): CKTOTAL, CKMB, CKMBINDEX, TROPONINI in the last 72 hours. BNP Invalid input(s): POCBNP D-Dimer No results for input(s): DDIMER in the last 72 hours. Hemoglobin A1C No results for input(s): HGBA1C in the last 72 hours. Fasting Lipid Panel No results for input(s): CHOL, HDL, LDLCALC, TRIG, CHOLHDL, LDLDIRECT in the last 72 hours. Thyroid Function Tests No results for input(s): TSH, T4TOTAL, T3FREE, THYROIDAB in the last 72 hours.  Invalid input(s): FREET3  Telemetry    NSR 70-80s with paroxysmal atrial tachycardia  (personally reviewed)  Radiology    DG Pelvis 1-2 Views  Result Date: 05/15/2019 CLINICAL DATA:  Syncope with fall today.  Buttock region pain. EXAM: PELVIS - 1-2 VIEW COMPARISON:  None. FINDINGS: No fracture or bone lesion. Bilateral superolateral hip  joint space narrowing. Small marginal osteophytes from the bases of the femoral heads. SI joints and symphysis pubis normally spaced and aligned. Skeletal structures are demineralized. Soft tissues are unremarkable. IMPRESSION: 1. No fracture or acute finding. 2. Bilateral hip joint degenerative change. Electronically Signed   By: David  Ormond M.D.   On: 05/15/2019 13:52   DG Shoulder Right  Result Date: 05/15/2019 CLINICAL DATA:  Syncope with fall today.  Right shoulder pain. EXAM: RIGHT SHOULDER - 2+ VIEW COMPARISON:  None. FINDINGS: No fracture, bone lesion or dislocation. Mild narrowing of the inferior glenohumeral joint with small inferior humeral head marginal osteophytes. Narrowed AC joint with more prominent osteophytes, particularly  superiorly. Soft tissues are unremarkable. IMPRESSION: 1. No fracture or acute finding. 2. Mild glenohumeral joint arthropathic changes. 3. Moderate AC joint osteoarthritis. Electronically Signed   By: David  Ormond M.D.   On: 05/15/2019 13:50   CT Head Wo Contrast  Result Date: 05/15/2019 CLINICAL DATA:  Syncope and head trauma EXAM: CT HEAD WITHOUT CONTRAST CT CERVICAL SPINE WITHOUT CONTRAST TECHNIQUE: Multidetector CT imaging of the head and cervical spine was performed following the standard protocol without intravenous contrast. Multiplanar CT image reconstructions of the cervical spine were also generated. COMPARISON:  None. FINDINGS: CT HEAD FINDINGS Brain: No evidence of acute infarction, hemorrhage, hydrocephalus, extra-axial collection or mass lesion/mass effect. Vascular: No hyperdense vessel or unexpected calcification. Skull: Normal. Negative for fracture or focal lesion. Sinuses/Orbits: No acute finding. CT CERVICAL SPINE FINDINGS Alignment: Straightening of the cervical spine Skull base and vertebrae: No acute fracture. No primary bone lesion or focal pathologic process. Soft tissues and spinal canal: No prevertebral fluid or swelling. No visible canal hematoma. Disc levels: Diffuse degenerative disc narrowing and facet spurring with C2-3 ankylosis. Upper chest: Negative IMPRESSION: No evidence of acute intracranial or cervical spine injury. Electronically Signed   By: Jonathon  Watts M.D.   On: 05/15/2019 12:19   CT Cervical Spine Wo Contrast  Result Date: 05/15/2019 CLINICAL DATA:  Syncope and head trauma EXAM: CT HEAD WITHOUT CONTRAST CT CERVICAL SPINE WITHOUT CONTRAST TECHNIQUE: Multidetector CT imaging of the head and cervical spine was performed following the standard protocol without intravenous contrast. Multiplanar CT image reconstructions of the cervical spine were also generated. COMPARISON:  None. FINDINGS: CT HEAD FINDINGS Brain: No evidence of acute infarction, hemorrhage,  hydrocephalus, extra-axial collection or mass lesion/mass effect. Vascular: No hyperdense vessel or unexpected calcification. Skull: Normal. Negative for fracture or focal lesion. Sinuses/Orbits: No acute finding. CT CERVICAL SPINE FINDINGS Alignment: Straightening of the cervical spine Skull base and vertebrae: No acute fracture. No primary bone lesion or focal pathologic process. Soft tissues and spinal canal: No prevertebral fluid or swelling. No visible canal hematoma. Disc levels: Diffuse degenerative disc narrowing and facet spurring with C2-3 ankylosis. Upper chest: Negative IMPRESSION: No evidence of acute intracranial or cervical spine injury. Electronically Signed   By: Jonathon  Watts M.D.   On: 05/15/2019 12:19     Patient Profile     Dreshon F Guaman is a 83 y.o. male with a past medical history significant for atrial flutter s/p ablation, HTN, and CAD s/p CABG 05/2017.  He was admitted for syncope.   Assessment & Plan    1. Syncope -> Tachybradycardia syndrome with post conversion pauses His pauses have improved off of coreg. Only AAD option that remains to him with bradycardia and CAD is amiodarone, which could potentially worsen his bradycardia. We discussed indication, risks, and benefits of pacemaker at length including   bleeding, infection, risk of pneumothorax, lead dislodgement, pericardial effusion, heart attack, stroke, and death. Pt verbalizes understanding and agrees to proceed. Dr. Lovena Le will plan PPM implantation at the next available time.   2. Paroxsymal atrial tachycardia  Coreg on hold. Minimally symptomatic with elevated HRs Not a good candidate for tikosyn with baseline QTc of ~480 ms.  3. HTN Mildly elevated off coreg; 140-160s  4. H/o Atrial flutter 01/15/2019 Stable  5. CAD s/p CABG Denies ischemic symptoms  For questions or updates, please contact [place] Please consult [email or web address] for contact info under Cardiology/STEMI.  Signed, Shirley Friar, PA-C  05/17/2019, 7:44 AM   EP Attending  Patient seen and examined. Agree with the findings as noted above. The patient presents with recurrent syncope and found to have post termination pauses on tele. He has a h/o atrial flutter, s/p ablation and now atrial fib. He has an RVR. He has RBBB and his baseline QT is prolonged in the 470 range. I discussed the treatment options in detail. He has symptomatic tachybrady syndrome. He has atrial fib with an RVR and also long pauses and syncope. His syncope history sounds strongly arrhythmic in that there is no warning and he is out only seconds. I have reviewed the indications/risks/benefits/goals/expectations of PPM insertion and he wishes to proceed.  Mikle Bosworth.D.

## 2019-05-17 NOTE — Interval H&P Note (Signed)
History and Physical Interval Note:  05/17/2019 3:55 PM  Wayne Mitchell  has presented today for surgery, with the diagnosis of syncope.  The various methods of treatment have been discussed with the patient and family. After consideration of risks, benefits and other options for treatment, the patient has consented to  Procedure(s): PACEMAKER IMPLANT (N/A) as a surgical intervention.  The patient's history has been reviewed, patient examined, no change in status, stable for surgery.  I have reviewed the patient's chart and labs.  Questions were answered to the patient's satisfaction.     Lewayne Bunting

## 2019-05-17 NOTE — Progress Notes (Addendum)
Electrophysiology Rounding Note  Patient Name: Wayne Mitchell Date of Encounter: 05/17/2019  Electrophysiologist: Dr. Graciela Husbands   Subjective   The patient is doing well today.  At this time, the patient denies chest pain, shortness of breath, or any new concerns.  Inpatient Medications    Scheduled Meds: . amLODipine  10 mg Oral Daily  . atorvastatin  20 mg Oral Daily  . furosemide  20 mg Oral Daily  . heparin  5,000 Units Subcutaneous Q8H  . levothyroxine  112 mcg Oral QAC breakfast  . lidocaine (PF)  5 mL Infiltration Once  . pantoprazole  40 mg Oral BID   Continuous Infusions:  PRN Meds: acetaminophen, ondansetron (ZOFRAN) IV   Vital Signs    Vitals:   05/16/19 1845 05/16/19 2032 05/17/19 0216 05/17/19 0423  BP: 126/65 (!) 146/62  (!) 151/77  Pulse: 82 66 66 72  Resp: 16 19 14 16   Temp:  98.3 F (36.8 C)  97.8 F (36.6 C)  TempSrc:  Oral  Oral  SpO2: 94% 95% 95% 96%  Weight:    64.5 kg  Height:        Intake/Output Summary (Last 24 hours) at 05/17/2019 0744 Last data filed at 05/16/2019 1834 Gross per 24 hour  Intake 600 ml  Output 800 ml  Net -200 ml   Filed Weights   05/15/19 1800 05/16/19 0408 05/17/19 0423  Weight: 67.7 kg 67.8 kg 64.5 kg    Physical Exam    GEN- The patient is well appearing, alert and oriented x 3 today.   Head- normocephalic, atraumatic Eyes-  Sclera clear, conjunctiva pink Ears- hearing intact Oropharynx- clear Neck- supple Lungs- Clear to ausculation bilaterally, normal work of breathing Heart- Regular rate and rhythm, no murmurs, rubs or gallops GI- soft, NT, ND, + BS Extremities- no clubbing, cyanosis, or edema Skin- no rash or lesion Psych- euthymic mood, full affect Neuro- strength and sensation are intact  Labs    CBC Recent Labs    05/15/19 1038 05/16/19 0422  WBC 5.8 7.7  NEUTROABS 3.8  --   HGB 12.8* 14.0  HCT 40.5 42.5  MCV 90.4 86.4  PLT 77* 79*   Basic Metabolic Panel Recent Labs     05/15/19 1038 05/16/19 0422  NA 138 138  K 4.3 3.3*  CL 102 101  CO2 26 24  GLUCOSE 115* 110*  BUN 15 14  CREATININE 1.32* 1.08  CALCIUM 8.9 8.7*   Liver Function Tests Recent Labs    05/15/19 1038  AST 24  ALT 20  ALKPHOS 107  BILITOT 1.2  PROT 6.9  ALBUMIN 3.7   No results for input(s): LIPASE, AMYLASE in the last 72 hours. Cardiac Enzymes No results for input(s): CKTOTAL, CKMB, CKMBINDEX, TROPONINI in the last 72 hours. BNP Invalid input(s): POCBNP D-Dimer No results for input(s): DDIMER in the last 72 hours. Hemoglobin A1C No results for input(s): HGBA1C in the last 72 hours. Fasting Lipid Panel No results for input(s): CHOL, HDL, LDLCALC, TRIG, CHOLHDL, LDLDIRECT in the last 72 hours. Thyroid Function Tests No results for input(s): TSH, T4TOTAL, T3FREE, THYROIDAB in the last 72 hours.  Invalid input(s): FREET3  Telemetry    NSR 70-80s with paroxysmal atrial tachycardia  (personally reviewed)  Radiology    DG Pelvis 1-2 Views  Result Date: 05/15/2019 CLINICAL DATA:  Syncope with fall today.  Buttock region pain. EXAM: PELVIS - 1-2 VIEW COMPARISON:  None. FINDINGS: No fracture or bone lesion. Bilateral superolateral hip  joint space narrowing. Small marginal osteophytes from the bases of the femoral heads. SI joints and symphysis pubis normally spaced and aligned. Skeletal structures are demineralized. Soft tissues are unremarkable. IMPRESSION: 1. No fracture or acute finding. 2. Bilateral hip joint degenerative change. Electronically Signed   By: Amie Portland M.D.   On: 05/15/2019 13:52   DG Shoulder Right  Result Date: 05/15/2019 CLINICAL DATA:  Syncope with fall today.  Right shoulder pain. EXAM: RIGHT SHOULDER - 2+ VIEW COMPARISON:  None. FINDINGS: No fracture, bone lesion or dislocation. Mild narrowing of the inferior glenohumeral joint with small inferior humeral head marginal osteophytes. Narrowed AC joint with more prominent osteophytes, particularly  superiorly. Soft tissues are unremarkable. IMPRESSION: 1. No fracture or acute finding. 2. Mild glenohumeral joint arthropathic changes. 3. Moderate AC joint osteoarthritis. Electronically Signed   By: Amie Portland M.D.   On: 05/15/2019 13:50   CT Head Wo Contrast  Result Date: 05/15/2019 CLINICAL DATA:  Syncope and head trauma EXAM: CT HEAD WITHOUT CONTRAST CT CERVICAL SPINE WITHOUT CONTRAST TECHNIQUE: Multidetector CT imaging of the head and cervical spine was performed following the standard protocol without intravenous contrast. Multiplanar CT image reconstructions of the cervical spine were also generated. COMPARISON:  None. FINDINGS: CT HEAD FINDINGS Brain: No evidence of acute infarction, hemorrhage, hydrocephalus, extra-axial collection or mass lesion/mass effect. Vascular: No hyperdense vessel or unexpected calcification. Skull: Normal. Negative for fracture or focal lesion. Sinuses/Orbits: No acute finding. CT CERVICAL SPINE FINDINGS Alignment: Straightening of the cervical spine Skull base and vertebrae: No acute fracture. No primary bone lesion or focal pathologic process. Soft tissues and spinal canal: No prevertebral fluid or swelling. No visible canal hematoma. Disc levels: Diffuse degenerative disc narrowing and facet spurring with C2-3 ankylosis. Upper chest: Negative IMPRESSION: No evidence of acute intracranial or cervical spine injury. Electronically Signed   By: Marnee Spring M.D.   On: 05/15/2019 12:19   CT Cervical Spine Wo Contrast  Result Date: 05/15/2019 CLINICAL DATA:  Syncope and head trauma EXAM: CT HEAD WITHOUT CONTRAST CT CERVICAL SPINE WITHOUT CONTRAST TECHNIQUE: Multidetector CT imaging of the head and cervical spine was performed following the standard protocol without intravenous contrast. Multiplanar CT image reconstructions of the cervical spine were also generated. COMPARISON:  None. FINDINGS: CT HEAD FINDINGS Brain: No evidence of acute infarction, hemorrhage,  hydrocephalus, extra-axial collection or mass lesion/mass effect. Vascular: No hyperdense vessel or unexpected calcification. Skull: Normal. Negative for fracture or focal lesion. Sinuses/Orbits: No acute finding. CT CERVICAL SPINE FINDINGS Alignment: Straightening of the cervical spine Skull base and vertebrae: No acute fracture. No primary bone lesion or focal pathologic process. Soft tissues and spinal canal: No prevertebral fluid or swelling. No visible canal hematoma. Disc levels: Diffuse degenerative disc narrowing and facet spurring with C2-3 ankylosis. Upper chest: Negative IMPRESSION: No evidence of acute intracranial or cervical spine injury. Electronically Signed   By: Marnee Spring M.D.   On: 05/15/2019 12:19     Patient Profile     Wayne Mitchell is a 84 y.o. male with a past medical history significant for atrial flutter s/p ablation, HTN, and CAD s/p CABG 05/2017.  He was admitted for syncope.   Assessment & Plan    1. Syncope -> Tachybradycardia syndrome with post conversion pauses His pauses have improved off of coreg. Only AAD option that remains to him with bradycardia and CAD is amiodarone, which could potentially worsen his bradycardia. We discussed indication, risks, and benefits of pacemaker at length including  bleeding, infection, risk of pneumothorax, lead dislodgement, pericardial effusion, heart attack, stroke, and death. Pt verbalizes understanding and agrees to proceed. Dr. Lovena Le will plan PPM implantation at the next available time.   2. Paroxsymal atrial tachycardia  Coreg on hold. Minimally symptomatic with elevated HRs Not a good candidate for tikosyn with baseline QTc of ~480 ms.  3. HTN Mildly elevated off coreg; 140-160s  4. H/o Atrial flutter 01/15/2019 Stable  5. CAD s/p CABG Denies ischemic symptoms  For questions or updates, please contact [place] Please consult [email or web address] for contact info under Cardiology/STEMI.  Signed, Shirley Friar, PA-C  05/17/2019, 7:44 AM   EP Attending  Patient seen and examined. Agree with the findings as noted above. The patient presents with recurrent syncope and found to have post termination pauses on tele. He has a h/o atrial flutter, s/p ablation and now atrial fib. He has an RVR. He has RBBB and his baseline QT is prolonged in the 470 range. I discussed the treatment options in detail. He has symptomatic tachybrady syndrome. He has atrial fib with an RVR and also long pauses and syncope. His syncope history sounds strongly arrhythmic in that there is no warning and he is out only seconds. I have reviewed the indications/risks/benefits/goals/expectations of PPM insertion and he wishes to proceed.  Mikle Bosworth.D.

## 2019-05-17 NOTE — Progress Notes (Signed)
Patient transferred from cath lab at 1709hrs.  Left chest dressing CDI, +2 radial pulse, left arm in sling.  Patient given post pacer instructions and verbalized understanding.

## 2019-05-18 ENCOUNTER — Inpatient Hospital Stay (HOSPITAL_COMMUNITY): Payer: Medicare HMO

## 2019-05-18 MED ORDER — APIXABAN 5 MG PO TABS: 5.0000 mg | ORAL_TABLET | Freq: Two times a day (BID) | ORAL | 6 refills | Status: DC

## 2019-05-18 MED FILL — Lidocaine HCl Local Inj 1%: INTRAMUSCULAR | Qty: 60 | Status: AC

## 2019-05-18 NOTE — Care Management Important Message (Signed)
Important Message  Patient Details  Name: Wayne Mitchell MRN: [id number] Date of Birth: 07/16/1935   Medicare Important Message Given:  Yes     Renie Ora 05/18/2019, 10:09 AM

## 2019-05-18 NOTE — Discharge Instructions (Signed)
After Your Pacemaker   . You have a [employer] Pacemaker  ACTIVITY . Do not lift your arm above shoulder height for 1 week after your procedure. After 7 days, you may progress as below.     Monday May 24, 2019  Tuesday May 25, 2019 Wednesday May 26, 2019 Thursday May 27, 2019   . Do not lift, push, pull, or carry anything over 10 pounds with the affected arm until 6 weeks (Monday Jun 28, 2019 ) after your procedure.   . Do NOT DRIVE until you have been seen for your wound check, or as long as instructed by your healthcare provider.   . Ask your healthcare provider when you can go back to work   INCISION/Dressing Start your Eliquis back Friday, 05/21/2019.   . Monitor your Pacemaker site for redness, swelling, and drainage. Call the device clinic at [phone] if you experience these symptoms or fever/chills.  . If your incision is sealed with Steri-strips or staples, you may shower 10 days after your procedure or when told by your provider. Do not remove the steri-strips or let the shower hit directly on your site. You may wash around your site with soap and water.    Marland Kitchen Avoid lotions, ointments, or perfumes over your incision until it is well-healed.  . You may use a hot tub or a pool AFTER your wound check appointment if the incision is completely closed.  Marland Kitchen PAcemaker Alerts:  Some alerts are vibratory and others beep. These are NOT emergencies. Please call our office to let us know. If this occurs at night or on weekends, it can wait until the next business day. Send a remote transmission.  . If your device is capable of reading fluid status (for heart failure), you will be offered monthly monitoring to review this with you.   DEVICE MANAGEMENT . Remote monitoring is used to monitor your pacemaker from home. This monitoring is scheduled every 91 days by our office. It allows [place] to keep an eye on the functioning of your device to ensure it is working properly. You will  routinely see your Electrophysiologist annually (more often if necessary).   . You should receive your ID card for your new device in 4-8 weeks. Keep this card with you at all times once received. Consider wearing a medical alert bracelet or necklace.  . Your Pacemaker may be MRI compatible. This will be discussed at your next office visit/wound check.  You should avoid contact with strong electric or magnetic fields.    Do not use amateur (ham) radio equipment or electric (arc) welding torches. MP3 player headphones with magnets should not be used. Some devices are safe to use if held at least 12 inches (30 cm) from your Pacemaker. These include power tools, lawn mowers, and speakers. If you are unsure if something is safe to use, ask your health care provider.   When using your cell phone, hold it to the ear that is on the opposite side from the Pacemaker. Do not leave your cell phone in a pocket over the Pacemaker.   You may safely use electric blankets, heating pads, computers, and microwave ovens.  Call the office right away if:  You have chest pain.  You feel more short of breath than you have felt before.  You feel more light-headed than you have felt before.  Your incision starts to open up.  This information is not intended to replace advice given to you by your  health care provider. Make sure you discuss any questions you have with your health care provider.

## 2019-05-19 DIAGNOSIS — Z950 Presence of cardiac pacemaker: Secondary | ICD-10-CM | POA: Diagnosis not present

## 2019-05-19 DIAGNOSIS — Z4802 Encounter for removal of sutures: Secondary | ICD-10-CM | POA: Diagnosis not present

## 2019-05-19 DIAGNOSIS — I495 Sick sinus syndrome: Secondary | ICD-10-CM | POA: Diagnosis not present

## 2019-05-19 DIAGNOSIS — I4892 Unspecified atrial flutter: Secondary | ICD-10-CM | POA: Diagnosis not present

## 2019-05-27 ENCOUNTER — Other Ambulatory Visit: Payer: Self-pay

## 2019-05-27 ENCOUNTER — Ambulatory Visit (INDEPENDENT_AMBULATORY_CARE_PROVIDER_SITE_OTHER): Payer: Medicare HMO | Admitting: *Deleted

## 2019-05-27 DIAGNOSIS — I495 Sick sinus syndrome: Secondary | ICD-10-CM | POA: Diagnosis not present

## 2019-05-27 NOTE — Patient Instructions (Signed)
Call tech support number on the front of your home monitor to troubleshoot monitor when you get home.

## 2019-05-31 LAB — CUP PACEART INCLINIC DEVICE CHECK
Battery Remaining Longevity: 122 mo
Battery Voltage: 3.1 V
Brady Statistic RA Percent Paced: 18 %
Brady Statistic RV Percent Paced: 4.9 %
Date Time Interrogation Session: 20210415104700
Implantable Lead Implant Date: 20210405
Implantable Lead Implant Date: 20210405
Implantable Lead Location: 753859
Implantable Lead Location: 753860
Implantable Pulse Generator Implant Date: 20210405
Lead Channel Impedance Value: 462.5 Ohm
Lead Channel Impedance Value: 587.5 Ohm
Lead Channel Pacing Threshold Amplitude: 0.5 V
Lead Channel Pacing Threshold Amplitude: 0.5 V
Lead Channel Pacing Threshold Pulse Width: 0.5 ms
Lead Channel Pacing Threshold Pulse Width: 0.5 ms
Lead Channel Sensing Intrinsic Amplitude: 3.1 mV
Lead Channel Sensing Intrinsic Amplitude: 9.5 mV
Lead Channel Setting Pacing Amplitude: 3.5 V
Lead Channel Setting Pacing Amplitude: 3.5 V
Lead Channel Setting Pacing Pulse Width: 0.5 ms
Lead Channel Setting Sensing Sensitivity: 2 mV
Pulse Gen Model: 2272
Pulse Gen Serial Number: 3815020

## 2019-05-31 NOTE — Progress Notes (Signed)
Wound check appointment. Steri-strips removed. Wound without redness or edema. Incision edges approximated, wound well healed. Normal device function. Thresholds, sensing, and impedances consistent with implant measurements. Device programmed at 3.5V/auto capture programmed on for extra safety margin until 3 month visit. Histogram distribution appropriate for patient and level of activity. 1 ams event that is 8 seconds in length, appears to be AT. No high ventricular rates noted. Patient educated about wound care, arm mobility, lifting restrictions. ROV with Dr Ladona Ridgel 08/19/19. remote transmissions every 3 months, next remote scheduled for 08/17/2019.

## 2019-06-08 DIAGNOSIS — I10 Essential (primary) hypertension: Secondary | ICD-10-CM | POA: Diagnosis not present

## 2019-06-08 DIAGNOSIS — N1831 Chronic kidney disease, stage 3a: Secondary | ICD-10-CM | POA: Diagnosis not present

## 2019-06-08 DIAGNOSIS — I2510 Atherosclerotic heart disease of native coronary artery without angina pectoris: Secondary | ICD-10-CM | POA: Diagnosis not present

## 2019-06-08 DIAGNOSIS — E039 Hypothyroidism, unspecified: Secondary | ICD-10-CM | POA: Diagnosis not present

## 2019-06-08 DIAGNOSIS — E7800 Pure hypercholesterolemia, unspecified: Secondary | ICD-10-CM | POA: Diagnosis not present

## 2019-06-08 DIAGNOSIS — I25810 Atherosclerosis of coronary artery bypass graft(s) without angina pectoris: Secondary | ICD-10-CM | POA: Diagnosis not present

## 2019-06-08 DIAGNOSIS — N183 Chronic kidney disease, stage 3 unspecified: Secondary | ICD-10-CM | POA: Diagnosis not present

## 2019-06-24 ENCOUNTER — Telehealth: Payer: Self-pay

## 2019-06-24 NOTE — Telephone Encounter (Signed)
Spoke with pt.  He states he has noticed his weight gradually increasing.  Following hospital stay it was consistntly 145, in the alst week he has noticed that it is now 150.  Pt denies any SOB, new onset cough or swelling.  Overall pt feels good.  Noted that pt baseline weight at hospital was 152.  D/w pt possibilty of weight loss following procedure and now he is getting back to baseline.  Pt wioll continue to monitor and if any symptoms occur notify MD.

## 2019-06-24 NOTE — Telephone Encounter (Signed)
Pt called and is concerned about weight gain since he was implanted 05/17/2019. Patient states that is something [facility] told him to watch out for and call if he has weight gain over 3 lbs pt states that he has gained 5 lbs. Patient is concerned and would like a call back. I had patient send a transmission and it was successful.

## 2019-06-25 ENCOUNTER — Telehealth: Payer: Self-pay | Admitting: *Deleted

## 2019-06-25 NOTE — Telephone Encounter (Signed)
PPM alert received for 2 HVR episodes on 06/24/19. EGMs appear 1:1 SVT 170s, longest 69 sec. 5.4% AT/AF burden, available EGMs appear AT/A-flutter (peak A 205bpm), longest reportedly 1.5hrs, though some episodes ongoing at time of EGM collection due to flutter waves in blanking. Presenting rhythm from 06/25/19 shows AS/VS 80s-90s. Hx A-flutter ablation 01/15/19, Eliquis d/c per MD note from 02/23/19. Next f/u with Dr. Ladona Ridgel currently on 08/19/19.  LMOVM (DPR) requesting call back from patient to discuss transmission results.

## 2019-06-26 IMAGING — DX DG CHEST 1V PORT
1 series · 1 of 1 positions shown · non-contrast
Comparison: 06/02/2017

CLINICAL DATA: Post CABG, chest tube

EXAM:
PORTABLE CHEST 1 VIEW

[chest ap]
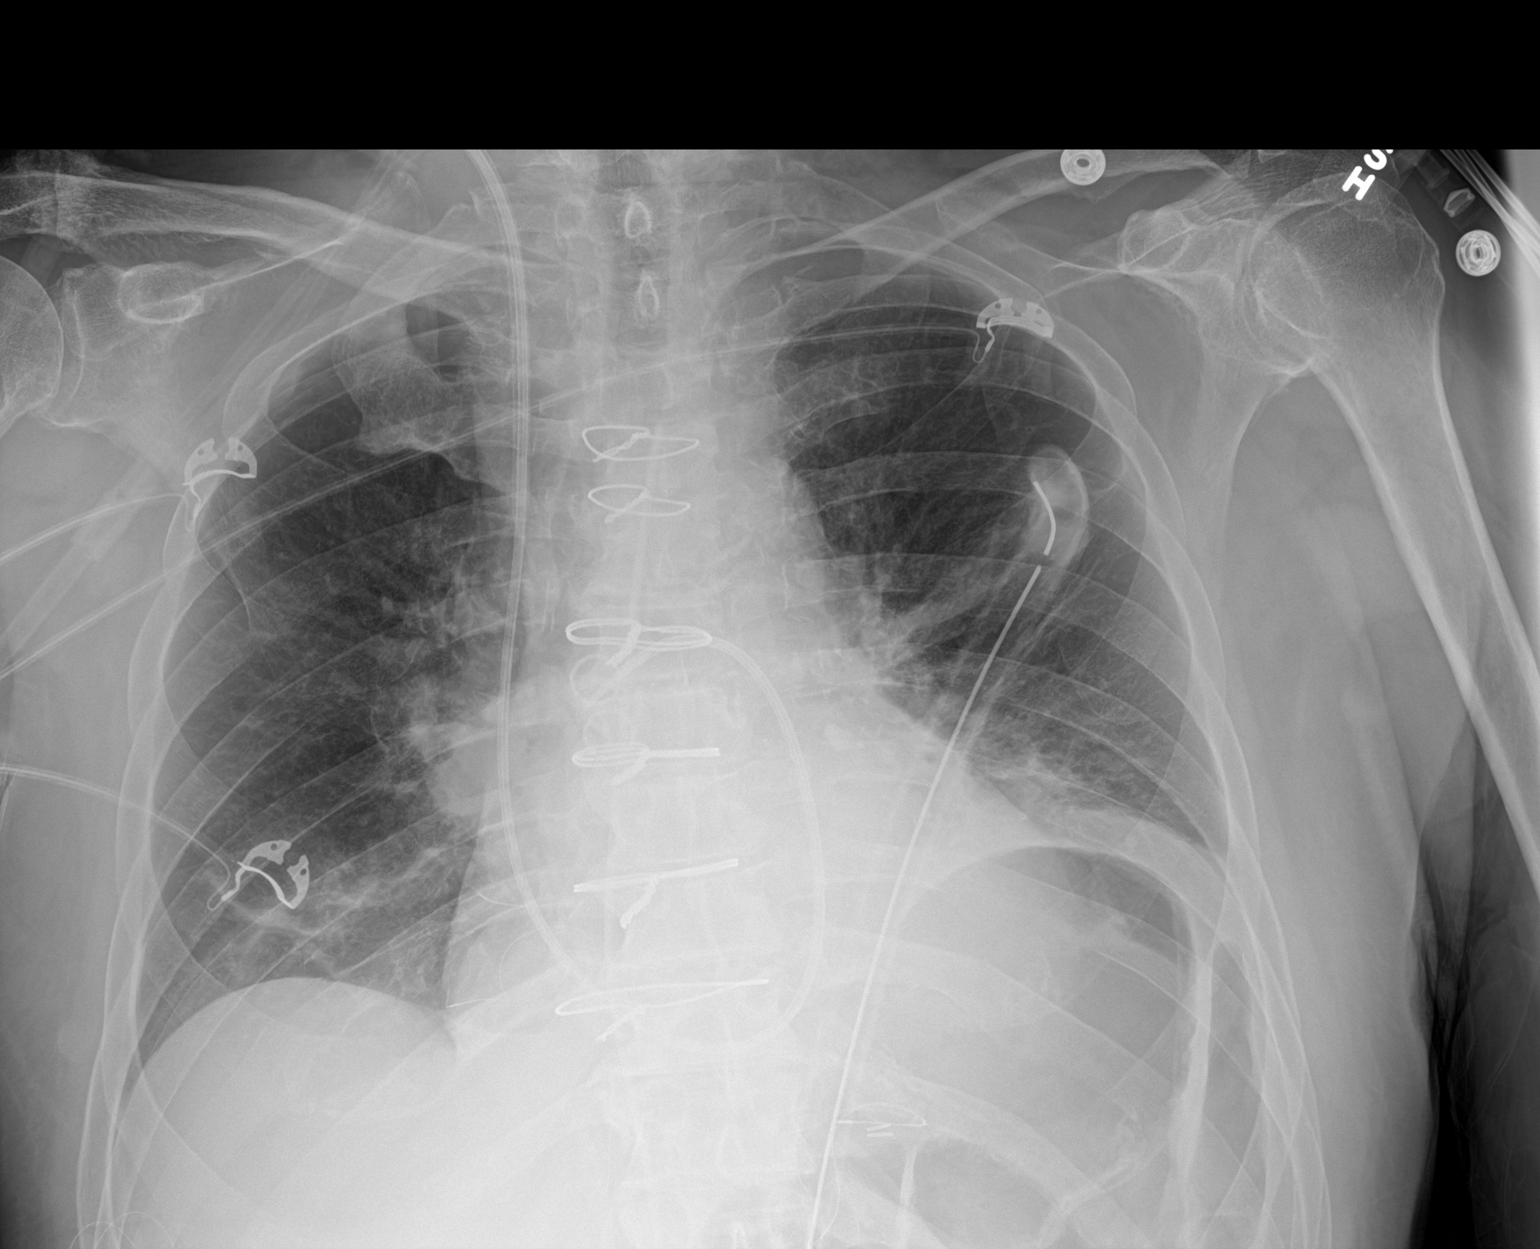

[1 of 1 positions shown; findings below may reference images not displayed]

FINDINGS: Interval extubation and removal of NG tube. Swan-Ganz catheter has
been advanced into the central right lower lobe pulmonary artery.
Left chest tube remains in place. No pneumothorax. Mild
cardiomegaly, vascular congestion and bibasilar atelectasis.
IMPRESSION: Vascular congestion, bibasilar atelectasis.

No pneumothorax.

## 2019-06-27 IMAGING — DX DG CHEST 1V PORT
1 series · 1 of 1 positions shown · non-contrast
Comparison: Study obtained earlier in the day

CLINICAL DATA: Chest tube insertion for pneumothorax

EXAM:
PORTABLE CHEST 1 VIEW

[chest]
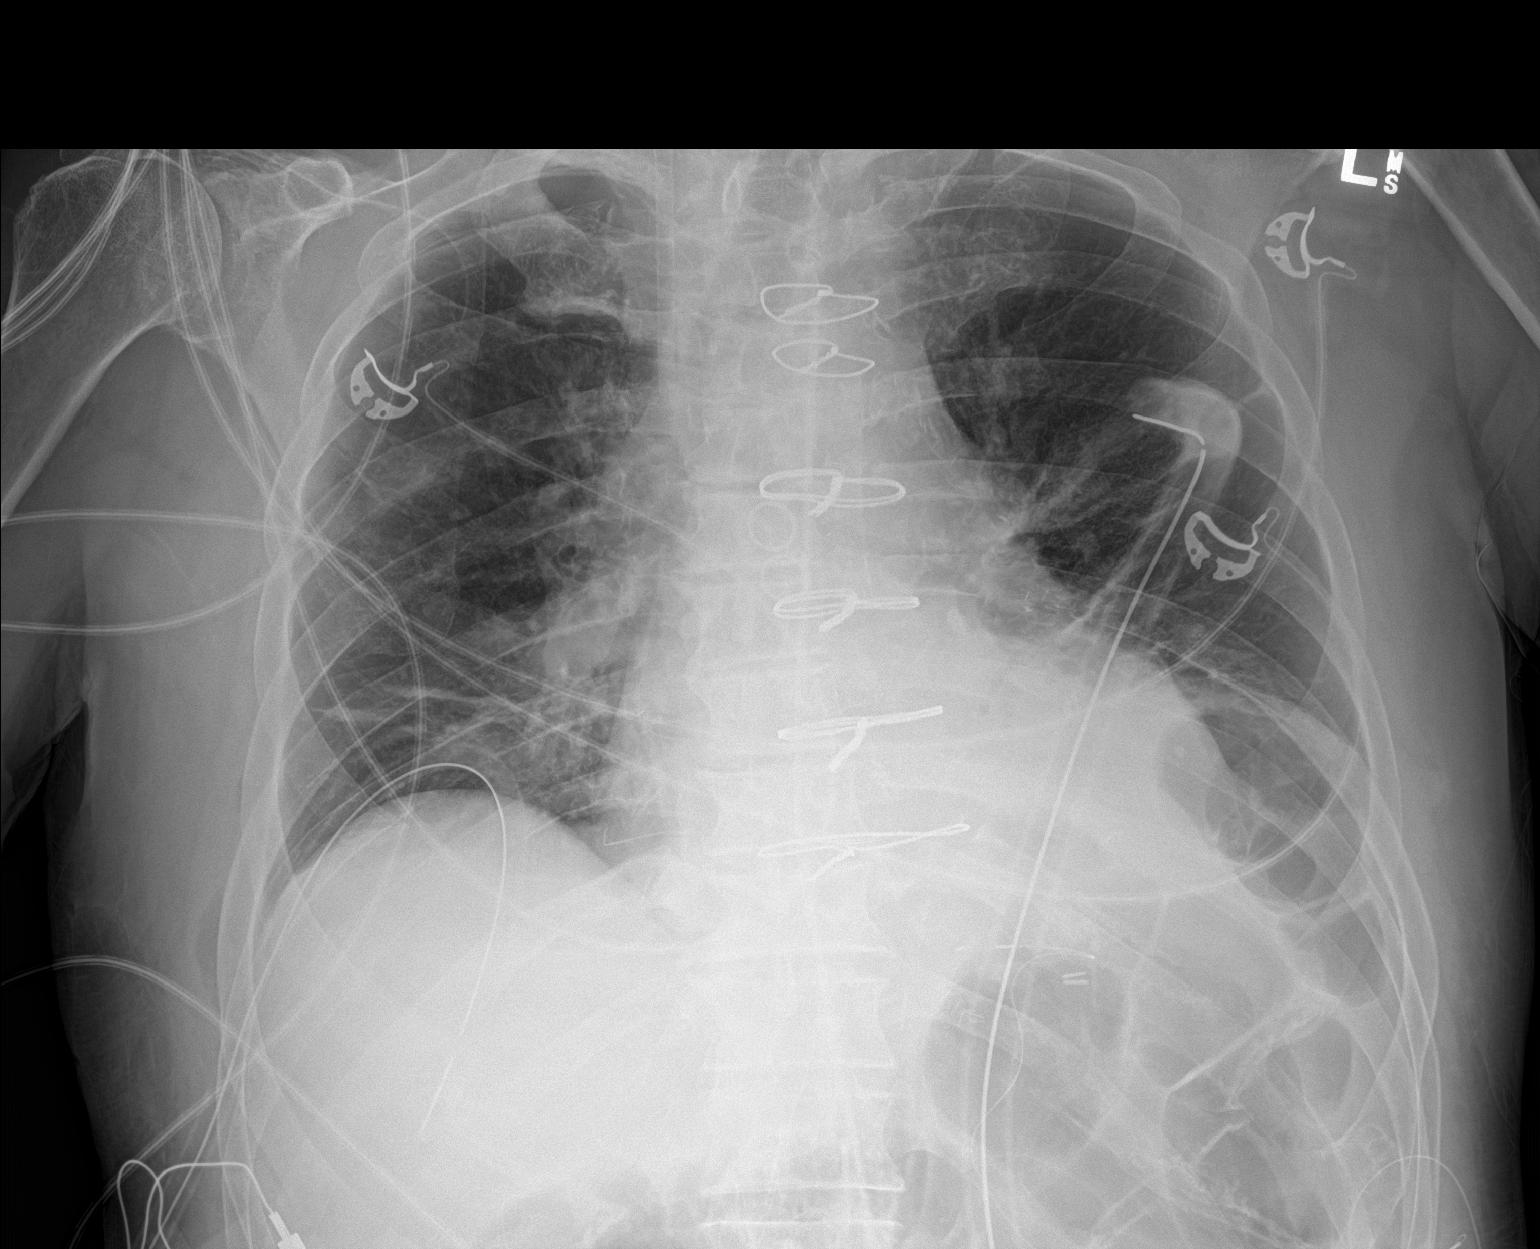

[1 of 1 positions shown; findings below may reference images not displayed]

FINDINGS: There is a new right-sided chest tube with the tip directed
inferiorly on the right. There is a small residual pneumothorax in
the right apex region without pneumothorax. The remainder of the
pneumothorax has resolved.

There is a persistent chest tube on the left without pneumothorax on
the left. Temporary pacemaker wires are attached to the right heart.
Cordis tip is in the superior vena cava.

There is bilateral atelectasis in the lower lung zones. There is
consolidation in the medial left base. Heart size and pulmonary
vascularity are normal. No adenopathy. No evident bone lesions.
IMPRESSION: Chest tube placed on the right with small residual right apical
pneumothorax. No tension component. Tubes and catheters elsewhere
are unchanged.

Consolidation medial left base. Bilateral lower lung zone
atelectasis present. Stable cardiac silhouette.

## 2019-06-28 NOTE — Telephone Encounter (Signed)
Pt returned phone call.  States he does not recall any symptoms at time of episodes.  He does report that it may have been when he was doing some lifting in the yard.  Reminded patient that he should be refraining from lifting >10lbs with the left arm following implant which was 4/5.  He indicated he is following guidelines.  Pt confirms compliance with medications as ordered.

## 2019-06-28 NOTE — Telephone Encounter (Signed)
Pt was returning [place] phone call. I let him talk with device nurse Amy, rn.

## 2019-06-29 ENCOUNTER — Telehealth: Payer: Self-pay

## 2019-06-29 NOTE — Telephone Encounter (Signed)
Amy  Thx  atrial tach with ACL mostlly longer than 300 msec, presumably assoc with reasonable LA emptying velocity Will continue off anticoagulation and continue to monitor Thanks SK

## 2019-06-30 IMAGING — DX DG CHEST 1V PORT
1 series · 1 of 1 positions shown · non-contrast
Comparison: Chest x-ray from yesterday.

CLINICAL DATA: CABG.

EXAM:
PORTABLE CHEST 1 VIEW

[chest ap]
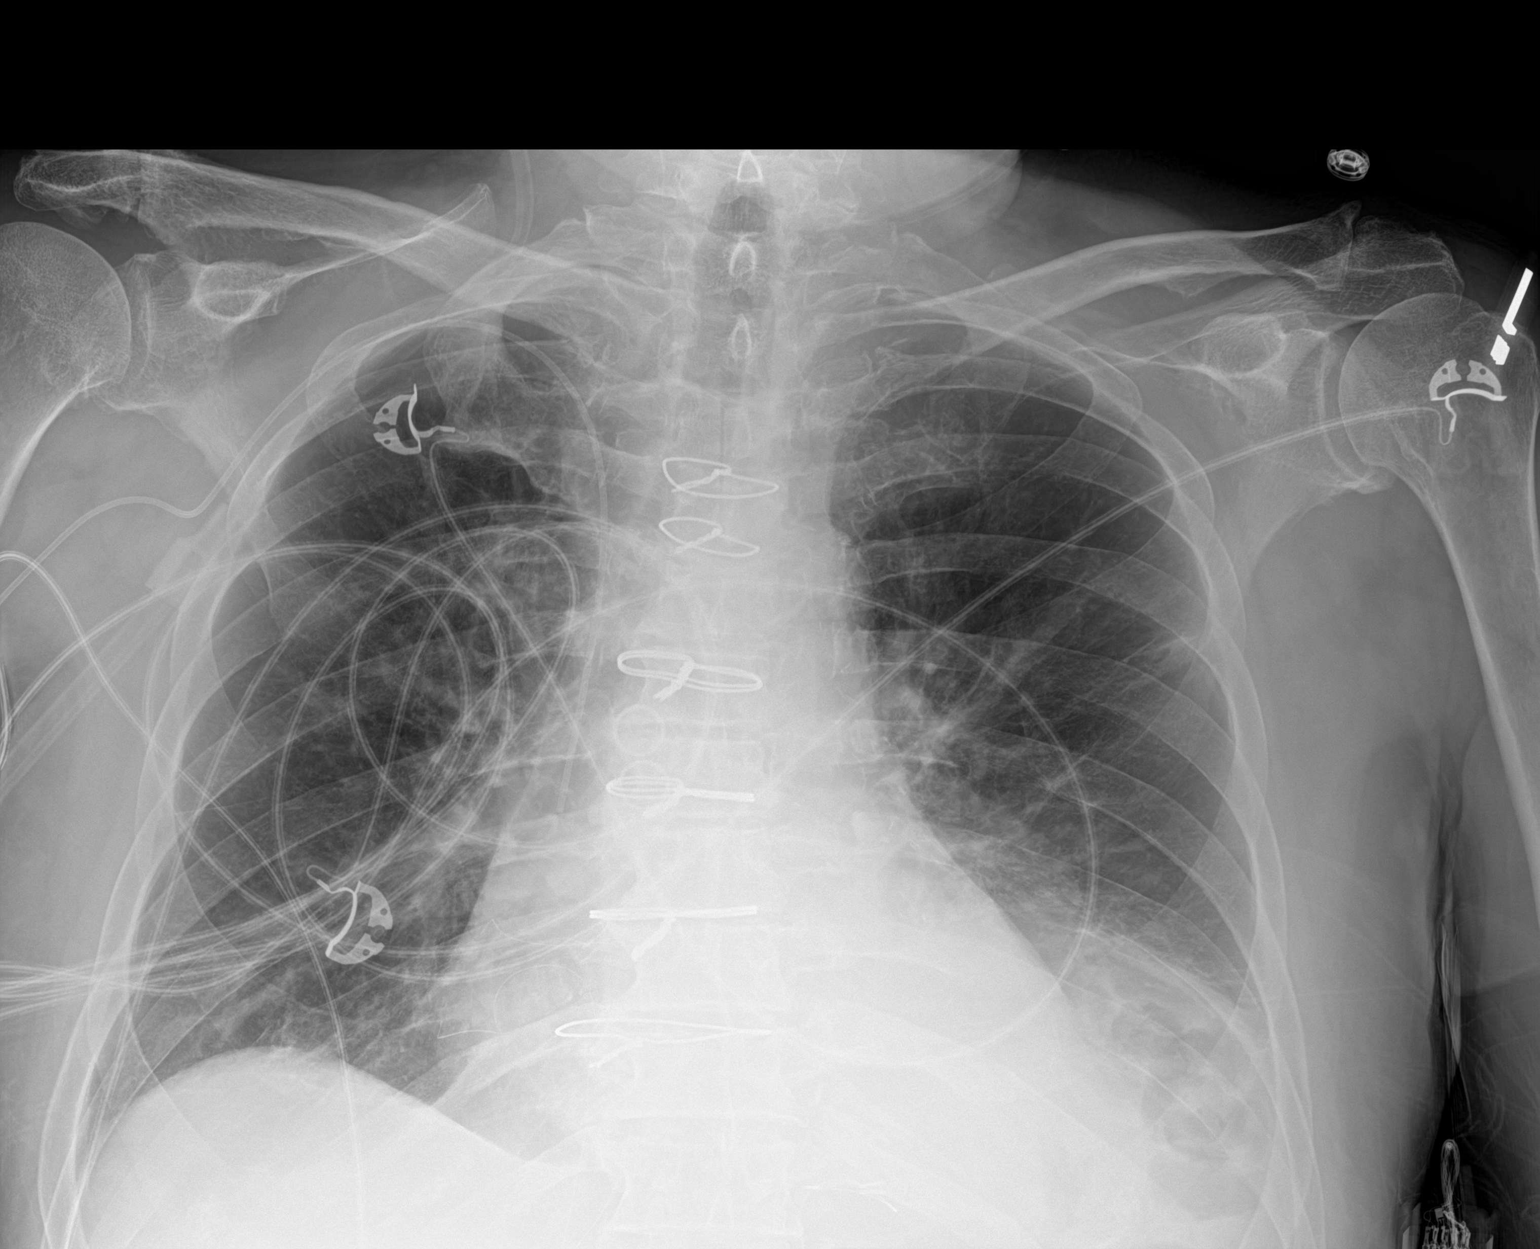

[1 of 1 positions shown; findings below may reference images not displayed]

FINDINGS: Unchanged right PICC line. Interval removal of the right chest tube.
Stable cardiomediastinal silhouette status post CABG. Normal
pulmonary vascularity. Unchanged elevation of the left hemidiaphragm
with left greater than right bibasilar atelectasis. No pleural
effusion or pneumothorax. No acute osseous abnormality.
IMPRESSION: 1. Interval removal of the right chest tube.  No pneumothorax.
2. Stable bibasilar atelectasis.

## 2019-07-05 ENCOUNTER — Telehealth: Payer: Self-pay | Admitting: Interventional Cardiology

## 2019-07-05 NOTE — Telephone Encounter (Signed)
New Message  Pt called and stated that weight has gone up and ankles are swollen

## 2019-07-05 NOTE — Telephone Encounter (Signed)
LMTCB

## 2019-07-05 NOTE — Telephone Encounter (Signed)
Spoke with pt who states he has had a 4 pound weight gain since last Tuesday along with swelling of feet and ankles.  Pt states symptoms started after eating a hotdog and drinking a Sprite.  Pt states he has been monitoring Na+ intake since then.  Pt drinks about 64 oz of fluids daily and has been taking medications as prescribed.  Pt denies CP but reports he has had some new mild exertional dyspnea.  B/P today 129/59 and HR 69.  Discussed importance of monitoring salt intake and eating preserved or canned foods due to hidden sodium.  Recommended pt elevate feet and legs as much as possible.  Will forward information to Dr Katrinka Blazing and his RN for review and recommendation.  Pt verbalizes understanding and agrees with current plan.

## 2019-07-09 DIAGNOSIS — E7800 Pure hypercholesterolemia, unspecified: Secondary | ICD-10-CM | POA: Diagnosis not present

## 2019-07-09 DIAGNOSIS — I10 Essential (primary) hypertension: Secondary | ICD-10-CM | POA: Diagnosis not present

## 2019-07-09 DIAGNOSIS — N1831 Chronic kidney disease, stage 3a: Secondary | ICD-10-CM | POA: Diagnosis not present

## 2019-07-09 DIAGNOSIS — N183 Chronic kidney disease, stage 3 unspecified: Secondary | ICD-10-CM | POA: Diagnosis not present

## 2019-07-09 DIAGNOSIS — E039 Hypothyroidism, unspecified: Secondary | ICD-10-CM | POA: Diagnosis not present

## 2019-07-09 DIAGNOSIS — I2510 Atherosclerotic heart disease of native coronary artery without angina pectoris: Secondary | ICD-10-CM | POA: Diagnosis not present

## 2019-07-09 DIAGNOSIS — I25810 Atherosclerosis of coronary artery bypass graft(s) without angina pectoris: Secondary | ICD-10-CM | POA: Diagnosis not present

## 2019-07-12 NOTE — Telephone Encounter (Signed)
How are legs and what blood pressures is he seeing?

## 2019-07-13 NOTE — Telephone Encounter (Signed)
No new instructions 

## 2019-07-13 NOTE — Telephone Encounter (Signed)
Pt states swelling has improved a lot since RN spoke with him last week.  Does still have some swelling but not as bad. Vitals this morning prior to meds were 131/72, HR 70.  Pt states this is about normal and then when he takes his meds, an hour later, SBP is down to 120.  Weight has been fluctuating between 150-153.  Has not had any other salty foods since he called in last week. Advised pt to continue current meds and I will update Dr. Katrinka Blazing.  Advised I will call back if any further recommendations.

## 2019-07-13 NOTE — Telephone Encounter (Signed)
Error in entry.

## 2019-07-14 NOTE — Telephone Encounter (Signed)
Left detailed message, ok per DPR.  Advised pt to call back if swelling continues and does not resolve.

## 2019-07-28 DIAGNOSIS — I25810 Atherosclerosis of coronary artery bypass graft(s) without angina pectoris: Secondary | ICD-10-CM | POA: Diagnosis not present

## 2019-07-28 DIAGNOSIS — I10 Essential (primary) hypertension: Secondary | ICD-10-CM | POA: Diagnosis not present

## 2019-07-28 DIAGNOSIS — I779 Disorder of arteries and arterioles, unspecified: Secondary | ICD-10-CM | POA: Diagnosis not present

## 2019-07-28 DIAGNOSIS — I4892 Unspecified atrial flutter: Secondary | ICD-10-CM | POA: Diagnosis not present

## 2019-07-28 DIAGNOSIS — E039 Hypothyroidism, unspecified: Secondary | ICD-10-CM | POA: Diagnosis not present

## 2019-07-28 DIAGNOSIS — Z1389 Encounter for screening for other disorder: Secondary | ICD-10-CM | POA: Diagnosis not present

## 2019-07-28 DIAGNOSIS — I700 Atherosclerosis of aorta: Secondary | ICD-10-CM | POA: Diagnosis not present

## 2019-07-28 DIAGNOSIS — E7800 Pure hypercholesterolemia, unspecified: Secondary | ICD-10-CM | POA: Diagnosis not present

## 2019-07-28 DIAGNOSIS — D696 Thrombocytopenia, unspecified: Secondary | ICD-10-CM | POA: Diagnosis not present

## 2019-08-17 ENCOUNTER — Ambulatory Visit (INDEPENDENT_AMBULATORY_CARE_PROVIDER_SITE_OTHER): Payer: Medicare HMO | Admitting: *Deleted

## 2019-08-17 DIAGNOSIS — I495 Sick sinus syndrome: Secondary | ICD-10-CM | POA: Diagnosis not present

## 2019-08-17 LAB — CUP PACEART REMOTE DEVICE CHECK
Battery Remaining Longevity: 79 mo
Battery Remaining Percentage: 95.5 %
Battery Voltage: 3.01 V
Brady Statistic AP VP Percent: 7.2 %
Brady Statistic AP VS Percent: 27 %
Brady Statistic AS VP Percent: 2.7 %
Brady Statistic AS VS Percent: 59 %
Brady Statistic RA Percent Paced: 26 %
Brady Statistic RV Percent Paced: 9.7 %
Date Time Interrogation Session: 20210706020015
Implantable Lead Implant Date: 20210405
Implantable Lead Implant Date: 20210405
Implantable Lead Location: 753859
Implantable Lead Location: 753860
Implantable Pulse Generator Implant Date: 20210405
Lead Channel Impedance Value: 440 Ohm
Lead Channel Impedance Value: 460 Ohm
Lead Channel Pacing Threshold Amplitude: 0.5 V
Lead Channel Pacing Threshold Amplitude: 0.5 V
Lead Channel Pacing Threshold Pulse Width: 0.5 ms
Lead Channel Pacing Threshold Pulse Width: 0.5 ms
Lead Channel Sensing Intrinsic Amplitude: 12 mV
Lead Channel Sensing Intrinsic Amplitude: 2.3 mV
Lead Channel Setting Pacing Amplitude: 3.5 V
Lead Channel Setting Pacing Amplitude: 3.5 V
Lead Channel Setting Pacing Pulse Width: 0.5 ms
Lead Channel Setting Sensing Sensitivity: 2 mV
Pulse Gen Model: 2272
Pulse Gen Serial Number: 3815020

## 2019-08-19 ENCOUNTER — Ambulatory Visit: Payer: Medicare HMO | Admitting: Internal Medicine

## 2019-08-19 ENCOUNTER — Encounter: Payer: Self-pay | Admitting: Internal Medicine

## 2019-08-19 ENCOUNTER — Other Ambulatory Visit: Payer: Self-pay

## 2019-08-19 VITALS — BP 120/56 | HR 63 | Ht 68.0 in | Wt 148.6 lb

## 2019-08-19 DIAGNOSIS — I495 Sick sinus syndrome: Secondary | ICD-10-CM | POA: Diagnosis not present

## 2019-08-19 DIAGNOSIS — Z950 Presence of cardiac pacemaker: Secondary | ICD-10-CM

## 2019-08-19 DIAGNOSIS — I483 Typical atrial flutter: Secondary | ICD-10-CM

## 2019-08-19 LAB — CUP PACEART INCLINIC DEVICE CHECK
Battery Remaining Longevity: 124 mo
Battery Voltage: 3.01 V
Brady Statistic RA Percent Paced: 26 %
Brady Statistic RV Percent Paced: 9.6 %
Date Time Interrogation Session: 20210708172721
Implantable Lead Implant Date: 20210405
Implantable Lead Implant Date: 20210405
Implantable Lead Location: 753859
Implantable Lead Location: 753860
Implantable Pulse Generator Implant Date: 20210405
Lead Channel Impedance Value: 462.5 Ohm
Lead Channel Impedance Value: 475 Ohm
Lead Channel Pacing Threshold Amplitude: 0.5 V
Lead Channel Pacing Threshold Amplitude: 0.75 V
Lead Channel Pacing Threshold Pulse Width: 0.5 ms
Lead Channel Pacing Threshold Pulse Width: 0.5 ms
Lead Channel Sensing Intrinsic Amplitude: 12 mV
Lead Channel Sensing Intrinsic Amplitude: 2.8 mV
Lead Channel Setting Pacing Amplitude: 2 V
Lead Channel Setting Pacing Amplitude: 2.5 V
Lead Channel Setting Pacing Pulse Width: 0.5 ms
Lead Channel Setting Sensing Sensitivity: 2 mV
Pulse Gen Model: 2272
Pulse Gen Serial Number: 3815020

## 2019-08-19 NOTE — Progress Notes (Signed)
Remote pacemaker transmission.   

## 2019-08-19 NOTE — Progress Notes (Signed)
HPI Wayne Mitchell returns today for followup. He is a pleasant 84 yo man with atrial flutter who went ablation and PPM insertion due to sinus node dysfunction. He has done well in the interim with no chest pain or sob. He has CAD and is s/p CABG and can walk on flat ground without limit. No syncope.  Allergies  Allergen Reactions  . Lisinopril Cough     Current Outpatient Medications  Medication Sig Dispense Refill  . amLODipine (NORVASC) 10 MG tablet Take 5 mg by mouth daily.     Marland Kitchen apixaban (ELIQUIS) 5 MG TABS tablet Take 1 tablet (5 mg total) by mouth 2 (two) times daily. 60 tablet 6  . atorvastatin (LIPITOR) 20 MG tablet Take 20 mg by mouth daily.    . carvedilol (COREG) 6.25 MG tablet Take 1 tablet (6.25 mg total) by mouth 2 (two) times daily. 180 tablet 3  . furosemide (LASIX) 20 MG tablet Take 1 tablet (20 mg total) by mouth daily. 90 tablet 3  . levothyroxine (SYNTHROID, LEVOTHROID) 112 MCG tablet Take 112 mcg by mouth daily before breakfast.    . pantoprazole (PROTONIX) 40 MG tablet Take 40 mg by mouth 2 (two) times daily.      No current facility-administered medications for this visit.     Past Medical History:  Diagnosis Date  . Carotid artery disease (HCC)   . Colon polyp    small  . Coronary artery disease   . Coronary artery disease involving native coronary artery of native heart with unstable angina pectoris (HCC) 05/30/2017   3V CABG 06/02/17  . HTN (hypertension)   . Hx of CABG   . Hypothyroidism   . Syncope   . Syncope   . Thrombocytopenia (HCC)     ROS:   All systems reviewed and negative except as noted in the HPI.   Past Surgical History:  Procedure Laterality Date  . A-FLUTTER ABLATION N/A 01/15/2019   Procedure: A-FLUTTER ABLATION;  Surgeon: Duke Salvia, MD;  Location: [facility] INVASIVE CV LAB;  Service: Cardiovascular;  Laterality: N/A;  . BALLOON DILATION N/A 02/13/2018   Procedure: BALLOON DILATION;  Surgeon: Kerin Salen, MD;  Location: [facility]  ENDOSCOPY;  Service: Gastroenterology;  Laterality: N/A;  . BIOPSY  02/13/2018   Procedure: BIOPSY;  Surgeon: Kerin Salen, MD;  Location: [facility] ENDOSCOPY;  Service: Gastroenterology;;  . CARDIOVERSION N/A 12/21/2018   Procedure: CARDIOVERSION;  Surgeon: Chrystie Nose, MD;  Location: [facility] ENDOSCOPY;  Service: Cardiovascular;  Laterality: N/A;  . CATARACT EXTRACTION Left   . CORONARY ARTERY BYPASS GRAFT N/A 06/02/2017   Procedure: CORONARY ARTERY BYPASS GRAFTING (CABG) x3 Using Left Internal Mammary Artery and Right Leg Greater Saphenous Vein harvested endoscopically;  Surgeon: Kerin Perna, MD;  Location: [facility] OR;  Service: Open Heart Surgery;  Laterality: N/A;  . ESOPHAGOGASTRODUODENOSCOPY N/A 10/23/2014   Procedure: ESOPHAGOGASTRODUODENOSCOPY (EGD);  Surgeon: Bernette Redbird, MD;  Location: [facility] ENDOSCOPY;  Service: Endoscopy;  Laterality: N/A;  . ESOPHAGOGASTRODUODENOSCOPY (EGD) WITH PROPOFOL N/A 02/13/2018   Procedure: ESOPHAGOGASTRODUODENOSCOPY (EGD) WITH PROPOFOL;  Surgeon: Kerin Salen, MD;  Location: [facility] ENDOSCOPY;  Service: Gastroenterology;  Laterality: N/A;  . FOREIGN BODY REMOVAL  02/13/2018   Procedure: FOREIGN BODY REMOVAL;  Surgeon: Kerin Salen, MD;  Location: [facility] ENDOSCOPY;  Service: Gastroenterology;;  . Rafael Bihari EXCHANGE Right 10/24/2017   Procedure: GAS/FLUID EXCHANGE;  Surgeon: Carmela Rima, MD;  Location: [facility] OR;  Service: Ophthalmology;  Laterality: Right;  SF6  . HEMORRHOID SURGERY    .  LEFT HEART CATH AND CORONARY ANGIOGRAPHY N/A 05/30/2017   Procedure: LEFT HEART CATH AND CORONARY ANGIOGRAPHY;  Surgeon: [name], MD;  Location: [facility] INVASIVE CV LAB;  Service: Cardiovascular;  Laterality: N/A;  . PACEMAKER IMPLANT N/A 05/17/2019   Procedure: PACEMAKER IMPLANT;  Surgeon: Marinus Maw, MD;  Location: [facility] INVASIVE CV LAB;  Service: Cardiovascular;  Laterality: N/A;  . PARS PLANA VITRECTOMY Right 10/24/2017   Procedure: PARS PLANA VITRECTOMY WITH 25 GAUGE;  Surgeon: Carmela Rima,  MD;  Location: [facility] OR;  Service: Ophthalmology;  Laterality: Right;  . PHOTOCOAGULATION Right 10/24/2017   Procedure: PHOTOCOAGULATION;  Surgeon: Carmela Rima, MD;  Location: [facility] OR;  Service: Ophthalmology;  Laterality: Right;  . REFRACTIVE SURGERY Right   . TEE WITHOUT CARDIOVERSION N/A 06/02/2017   Procedure: TRANSESOPHAGEAL ECHOCARDIOGRAM (TEE);  Surgeon: Donata Clay, Theron Arista, MD;  Location: [facility] OR;  Service: Open Heart Surgery;  Laterality: N/A;     Family History  Problem Relation Age of Onset  . Pancreatic cancer Mother   . Healthy Brother      Social History   Socioeconomic History  . Marital status: Married    Spouse name: Not on file  . Number of children: Not on file  . Years of education: Not on file  . Highest education level: Not on file  Occupational History  . Occupation: retired  Tobacco Use  . Smoking status: Never Smoker  . Smokeless tobacco: Never Used  Vaping Use  . Vaping Use: Never used  Substance and Sexual Activity  . Alcohol use: Yes    Alcohol/week: 4.0 standard drinks    Types: 4 Cans of beer per week  . Drug use: No  . Sexual activity: Not on file  Other Topics Concern  . Not on file  Social History Narrative  . Not on file   Social Determinants of Health   Financial Resource Strain:   . Difficulty of Paying Living Expenses:   Food Insecurity:   . Worried About Programme researcher, broadcasting/film/video in the Last Year:   . Barista in the Last Year:   Transportation Needs:   . Freight forwarder (Medical):   Marland Kitchen Lack of Transportation (Non-Medical):   Physical Activity:   . Days of Exercise per Week:   . Minutes of Exercise per Session:   Stress:   . Feeling of Stress :   Social Connections:   . Frequency of Communication with Friends and Family:   . Frequency of Social Gatherings with Friends and Family:   . Attends Religious Services:   . Active Member of Clubs or Organizations:   . Attends Banker Meetings:   Marland Kitchen Marital Status:    Intimate Partner Violence:   . Fear of Current or Ex-Partner:   . Emotionally Abused:   Marland Kitchen Physically Abused:   . Sexually Abused:      BP (!) 120/56   Pulse 63   Ht 5\' 8"  (1.727 m)   Wt 148 lb 9.6 oz (67.4 kg)   SpO2 97%   BMI 22.59 kg/m   Physical Exam:  Well appearing NAD HEENT: Unremarkable Neck:  No JVD, no thyromegally Lymphatics:  No adenopathy Back:  No CVA tenderness Lungs:  Clear with no wheezes HEART:  Regular rate rhythm, no murmurs, no rubs, no clicks Abd:  soft, positive bowel sounds, no organomegally, no rebound, no guarding Ext:  2 plus pulses, no edema, no cyanosis, no clubbing Skin:  No rashes no nodules Neuro:  CN II through XII intact, motor grossly intact  EKG NSR with RBBB DEVICE  Normal device function.  See PaceArt for details.   Assess/Plan: 1. Atrial fib/flutter  - he is maintaining NSR 97% of the time. He will continue his current meds. 2. Sinus node dysfunction - he is asymptomatic, s/p PPM insertion. 3. PPM - his [employer] Jude DDD PM is working normally. No change. 4. CAD - he denies anginal symptoms. No change in his meds.  Leonia Reeves.D.

## 2019-08-19 NOTE — Patient Instructions (Signed)
Medication Instructions:  Your physician recommends that you continue on your current medications as directed. Please refer to the Current Medication list given to you today.  *If you need a refill on your cardiac medications before your next appointment, please call your pharmacy*  Lab Work: None ordered.  If you have labs (blood work) drawn today and your tests are completely normal, you will receive your results only by: . MyChart Message (if you have MyChart) OR . A paper copy in the mail If you have any lab test that is abnormal or we need to change your treatment, we will call you to review the results.  Testing/Procedures: None ordered.  Follow-Up: At [facility], you and your health needs are our priority.  As part of our continuing mission to provide you with exceptional heart care, we have created designated Provider Care Teams.  These Care Teams include your primary Cardiologist (physician) and Advanced Practice Providers (APPs -  Physician Assistants and Nurse Practitioners) who all work together to provide you with the care you need, when you need it.  We recommend signing up for the patient portal called "MyChart".  Sign up information is provided on this After Visit Summary.  MyChart is used to connect with patients for Virtual Visits (Telemedicine).  Patients are able to view lab/test results, encounter notes, upcoming appointments, etc.  Non-urgent messages can be sent to your provider as well.   To learn more about what you can do with MyChart, go to [email or web address].    Your next appointment:   Your physician wants you to follow-up in: 1 year with Dr. Taylor. You will receive a reminder letter in the mail two months in advance. If you don't receive a letter, please call our office to schedule the follow-up appointment.  Remote monitoring is used to monitor your Pacemaker from home. This monitoring reduces the number of office visits required to check your device  to one time per year. It allows us to keep an eye on the functioning of your device to ensure it is working properly. You are scheduled for a device check from home on 11/16/2019. You may send your transmission at any time that day. If you have a wireless device, the transmission will be sent automatically. After your physician reviews your transmission, you will receive a postcard with your next transmission date.  Other Instructions:  

## 2019-08-30 DIAGNOSIS — N1831 Chronic kidney disease, stage 3a: Secondary | ICD-10-CM | POA: Diagnosis not present

## 2019-08-30 DIAGNOSIS — I10 Essential (primary) hypertension: Secondary | ICD-10-CM | POA: Diagnosis not present

## 2019-08-30 DIAGNOSIS — I5032 Chronic diastolic (congestive) heart failure: Secondary | ICD-10-CM | POA: Diagnosis not present

## 2019-08-30 DIAGNOSIS — I2510 Atherosclerotic heart disease of native coronary artery without angina pectoris: Secondary | ICD-10-CM | POA: Diagnosis not present

## 2019-08-30 DIAGNOSIS — E039 Hypothyroidism, unspecified: Secondary | ICD-10-CM | POA: Diagnosis not present

## 2019-08-30 DIAGNOSIS — I25810 Atherosclerosis of coronary artery bypass graft(s) without angina pectoris: Secondary | ICD-10-CM | POA: Diagnosis not present

## 2019-08-30 DIAGNOSIS — E7800 Pure hypercholesterolemia, unspecified: Secondary | ICD-10-CM | POA: Diagnosis not present

## 2019-08-30 DIAGNOSIS — N183 Chronic kidney disease, stage 3 unspecified: Secondary | ICD-10-CM | POA: Diagnosis not present

## 2019-09-20 DIAGNOSIS — H31003 Unspecified chorioretinal scars, bilateral: Secondary | ICD-10-CM | POA: Diagnosis not present

## 2019-09-20 DIAGNOSIS — H35372 Puckering of macula, left eye: Secondary | ICD-10-CM | POA: Diagnosis not present

## 2019-09-20 DIAGNOSIS — H524 Presbyopia: Secondary | ICD-10-CM | POA: Diagnosis not present

## 2019-09-20 DIAGNOSIS — H26493 Other secondary cataract, bilateral: Secondary | ICD-10-CM | POA: Diagnosis not present

## 2019-10-04 DIAGNOSIS — N1831 Chronic kidney disease, stage 3a: Secondary | ICD-10-CM | POA: Diagnosis not present

## 2019-10-04 DIAGNOSIS — K219 Gastro-esophageal reflux disease without esophagitis: Secondary | ICD-10-CM | POA: Diagnosis not present

## 2019-10-04 DIAGNOSIS — E7800 Pure hypercholesterolemia, unspecified: Secondary | ICD-10-CM | POA: Diagnosis not present

## 2019-10-04 DIAGNOSIS — N183 Chronic kidney disease, stage 3 unspecified: Secondary | ICD-10-CM | POA: Diagnosis not present

## 2019-10-04 DIAGNOSIS — I25810 Atherosclerosis of coronary artery bypass graft(s) without angina pectoris: Secondary | ICD-10-CM | POA: Diagnosis not present

## 2019-10-04 DIAGNOSIS — E039 Hypothyroidism, unspecified: Secondary | ICD-10-CM | POA: Diagnosis not present

## 2019-10-04 DIAGNOSIS — I5032 Chronic diastolic (congestive) heart failure: Secondary | ICD-10-CM | POA: Diagnosis not present

## 2019-10-04 DIAGNOSIS — I10 Essential (primary) hypertension: Secondary | ICD-10-CM | POA: Diagnosis not present

## 2019-10-04 DIAGNOSIS — I2510 Atherosclerotic heart disease of native coronary artery without angina pectoris: Secondary | ICD-10-CM | POA: Diagnosis not present

## 2019-10-14 DIAGNOSIS — H26492 Other secondary cataract, left eye: Secondary | ICD-10-CM | POA: Diagnosis not present

## 2019-10-15 ENCOUNTER — Other Ambulatory Visit (HOSPITAL_COMMUNITY): Payer: Self-pay | Admitting: Interventional Cardiology

## 2019-10-15 ENCOUNTER — Ambulatory Visit (HOSPITAL_COMMUNITY)
Admission: RE | Admit: 2019-10-15 | Discharge: 2019-10-15 | Disposition: A | Discharge status: Home / Self Care | Payer: Medicare HMO | Source: Ambulatory Visit | Attending: Cardiology | Admitting: Cardiology

## 2019-10-15 ENCOUNTER — Other Ambulatory Visit: Payer: Self-pay

## 2019-10-15 DIAGNOSIS — I6523 Occlusion and stenosis of bilateral carotid arteries: Secondary | ICD-10-CM

## 2019-11-04 DIAGNOSIS — I10 Essential (primary) hypertension: Secondary | ICD-10-CM | POA: Diagnosis not present

## 2019-11-04 DIAGNOSIS — E7800 Pure hypercholesterolemia, unspecified: Secondary | ICD-10-CM | POA: Diagnosis not present

## 2019-11-04 DIAGNOSIS — E039 Hypothyroidism, unspecified: Secondary | ICD-10-CM | POA: Diagnosis not present

## 2019-11-04 DIAGNOSIS — Z950 Presence of cardiac pacemaker: Secondary | ICD-10-CM | POA: Diagnosis not present

## 2019-11-04 DIAGNOSIS — I4892 Unspecified atrial flutter: Secondary | ICD-10-CM | POA: Diagnosis not present

## 2019-11-04 DIAGNOSIS — D696 Thrombocytopenia, unspecified: Secondary | ICD-10-CM | POA: Diagnosis not present

## 2019-11-04 DIAGNOSIS — Z1389 Encounter for screening for other disorder: Secondary | ICD-10-CM | POA: Diagnosis not present

## 2019-11-04 DIAGNOSIS — Z23 Encounter for immunization: Secondary | ICD-10-CM | POA: Diagnosis not present

## 2019-11-04 DIAGNOSIS — R7309 Other abnormal glucose: Secondary | ICD-10-CM | POA: Diagnosis not present

## 2019-11-04 DIAGNOSIS — I700 Atherosclerosis of aorta: Secondary | ICD-10-CM | POA: Diagnosis not present

## 2019-11-04 DIAGNOSIS — Z0000 Encounter for general adult medical examination without abnormal findings: Secondary | ICD-10-CM | POA: Diagnosis not present

## 2019-11-04 DIAGNOSIS — I25810 Atherosclerosis of coronary artery bypass graft(s) without angina pectoris: Secondary | ICD-10-CM | POA: Diagnosis not present

## 2019-11-16 ENCOUNTER — Ambulatory Visit (INDEPENDENT_AMBULATORY_CARE_PROVIDER_SITE_OTHER): Payer: Medicare HMO

## 2019-11-16 DIAGNOSIS — I495 Sick sinus syndrome: Secondary | ICD-10-CM | POA: Diagnosis not present

## 2019-11-17 LAB — CUP PACEART REMOTE DEVICE CHECK
Battery Remaining Longevity: 112 mo
Battery Remaining Percentage: 95.5 %
Battery Voltage: 3.02 V
Brady Statistic AP VP Percent: 4.7 %
Brady Statistic AP VS Percent: 28 %
Brady Statistic AS VP Percent: 4.8 %
Brady Statistic AS VS Percent: 61 %
Brady Statistic RA Percent Paced: 27 %
Brady Statistic RV Percent Paced: 9.1 %
Date Time Interrogation Session: 20211005020014
Implantable Lead Implant Date: 20210405
Implantable Lead Implant Date: 20210405
Implantable Lead Location: 753859
Implantable Lead Location: 753860
Implantable Pulse Generator Implant Date: 20210405
Lead Channel Impedance Value: 450 Ohm
Lead Channel Impedance Value: 450 Ohm
Lead Channel Pacing Threshold Amplitude: 0.5 V
Lead Channel Pacing Threshold Amplitude: 0.75 V
Lead Channel Pacing Threshold Pulse Width: 0.5 ms
Lead Channel Pacing Threshold Pulse Width: 0.5 ms
Lead Channel Sensing Intrinsic Amplitude: 12 mV
Lead Channel Sensing Intrinsic Amplitude: 2.8 mV
Lead Channel Setting Pacing Amplitude: 2 V
Lead Channel Setting Pacing Amplitude: 2.5 V
Lead Channel Setting Pacing Pulse Width: 0.5 ms
Lead Channel Setting Sensing Sensitivity: 2 mV
Pulse Gen Model: 2272
Pulse Gen Serial Number: 3815020

## 2019-11-19 NOTE — Progress Notes (Signed)
Remote pacemaker transmission.   

## 2019-12-06 ENCOUNTER — Other Ambulatory Visit: Payer: Self-pay | Admitting: Interventional Cardiology

## 2019-12-16 ENCOUNTER — Telehealth: Payer: Self-pay

## 2019-12-16 NOTE — Telephone Encounter (Signed)
Merlin alert received 12/16/19 for 2 HVR events, appear SVT, longest 10 minutes 14 seconds. Called patient to assess s/s and medication compliance including Eliquis 5 mg BID, Coreg 6.25 mg BID, Lasix 20 mg daily.  No answer, LMOVM.

## 2019-12-16 NOTE — Telephone Encounter (Signed)
Patient returned phone call. Patient states he was getting out of the shower yesterday during event. Denies any chest pain, palpitations, shortness of breath, cardiac or fluid retention issues. Patient reports compliance with medications. Advised patient to call with any concerns or questions. Verbalized understanding.

## 2019-12-19 NOTE — Telephone Encounter (Signed)
No change in meds.

## 2020-01-02 ENCOUNTER — Other Ambulatory Visit: Payer: Self-pay | Admitting: Interventional Cardiology

## 2020-01-03 NOTE — Telephone Encounter (Signed)
rx refill

## 2020-02-15 ENCOUNTER — Ambulatory Visit (INDEPENDENT_AMBULATORY_CARE_PROVIDER_SITE_OTHER): Payer: Medicare HMO

## 2020-02-15 DIAGNOSIS — I495 Sick sinus syndrome: Secondary | ICD-10-CM

## 2020-02-15 LAB — CUP PACEART REMOTE DEVICE CHECK
Battery Remaining Longevity: 110 mo
Battery Remaining Percentage: 95.5 %
Battery Voltage: 3.01 V
Brady Statistic AP VP Percent: 4.3 %
Brady Statistic AP VS Percent: 24 %
Brady Statistic AS VP Percent: 5.1 %
Brady Statistic AS VS Percent: 64 %
Brady Statistic RA Percent Paced: 22 %
Brady Statistic RV Percent Paced: 8.4 %
Date Time Interrogation Session: 20220104020017
Implantable Lead Implant Date: 20210405
Implantable Lead Implant Date: 20210405
Implantable Lead Location: 753859
Implantable Lead Location: 753860
Implantable Pulse Generator Implant Date: 20210405
Lead Channel Impedance Value: 440 Ohm
Lead Channel Impedance Value: 440 Ohm
Lead Channel Pacing Threshold Amplitude: 0.5 V
Lead Channel Pacing Threshold Amplitude: 0.75 V
Lead Channel Pacing Threshold Pulse Width: 0.5 ms
Lead Channel Pacing Threshold Pulse Width: 0.5 ms
Lead Channel Sensing Intrinsic Amplitude: 12 mV
Lead Channel Sensing Intrinsic Amplitude: 3 mV
Lead Channel Setting Pacing Amplitude: 2 V
Lead Channel Setting Pacing Amplitude: 2.5 V
Lead Channel Setting Pacing Pulse Width: 0.5 ms
Lead Channel Setting Sensing Sensitivity: 2 mV
Pulse Gen Model: 2272
Pulse Gen Serial Number: 3815020

## 2020-03-01 NOTE — Progress Notes (Signed)
Remote pacemaker transmission.   

## 2020-03-07 IMAGING — RF DG ESOPHAGUS
9 series · 14 of 24 positions shown · non-contrast
Comparison: Chest radiographs 07/14/2017 and earlier.

CLINICAL DATA: 82-year-old male with suspected food impaction in
the esophagus. Reports he was eating salad with check-in 5 days ago
and inadvertently swallowed a piece of chicken which he thought was
too big. Since that time only able to tolerate small volume liquids.
Stuck food sensation his lower chest, although mildly decreasing
over time.

EXAM:
ESOPHOGRAM/BARIUM SWALLOW
TECHNIQUE: Single contrast examination was performed using  thin barium.
FLUOROSCOPY TIME:  Fluoroscopy Time:  1 minutes 54 seconds
Radiation Exposure Index (if provided by the fluoroscopic device):
54 mGy
Number of Acquired Spot Images: 0

[Series 1: sequence · 1 of 34 frames shown (1 of 8)]
[frame 6/34]
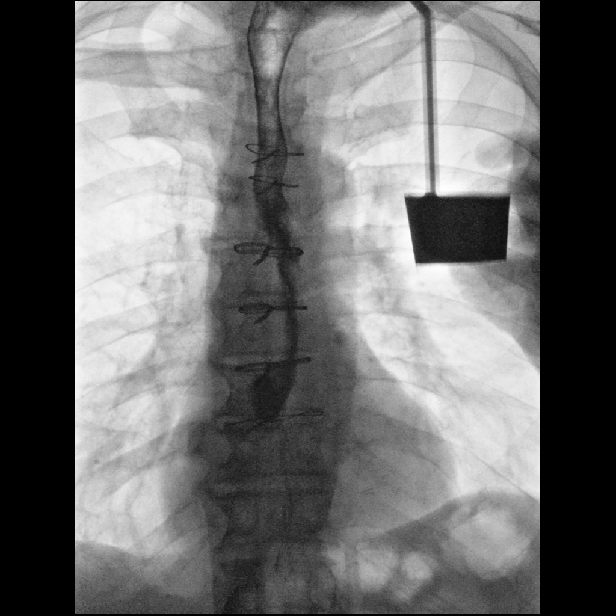

[Series 2: sequence · 2 of 10 frames shown (2 of 8)]
[frame 1/10]
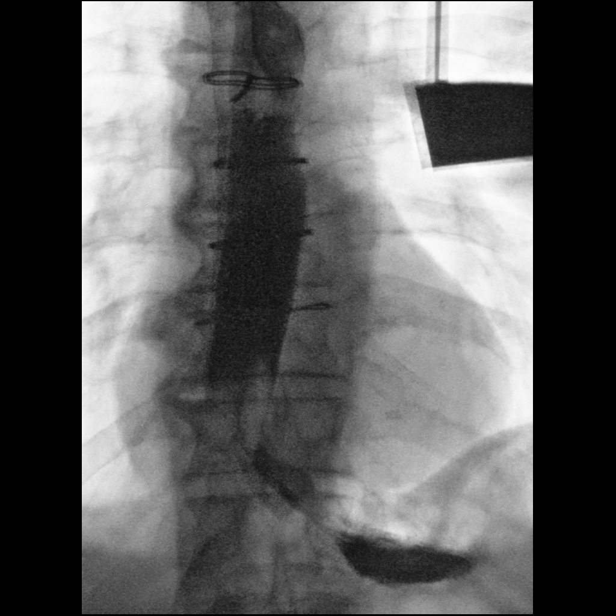
[frame 9/10]
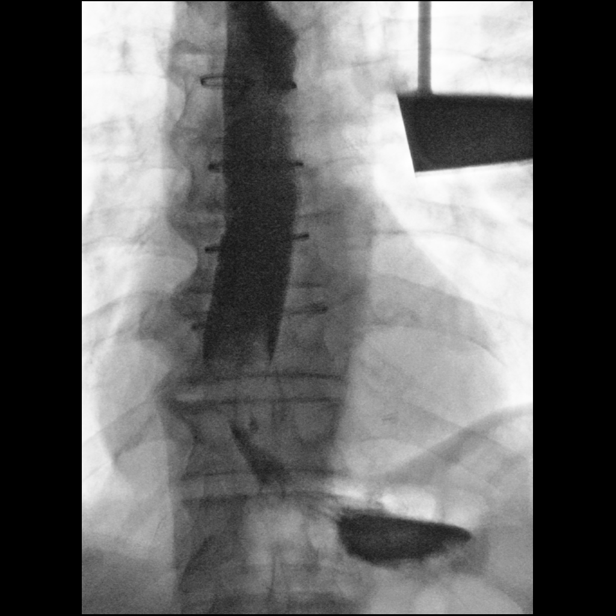

[Series 3: sequence · 2 of 26 frames shown (3 of 8)]
[frame 14/26]
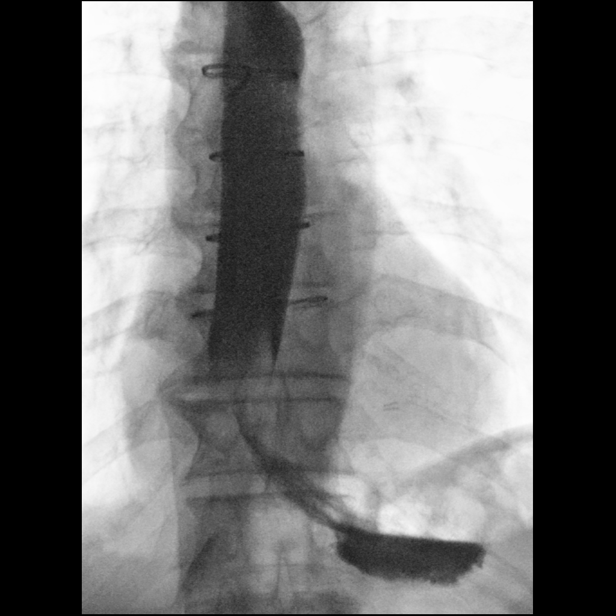
[frame 23/26]
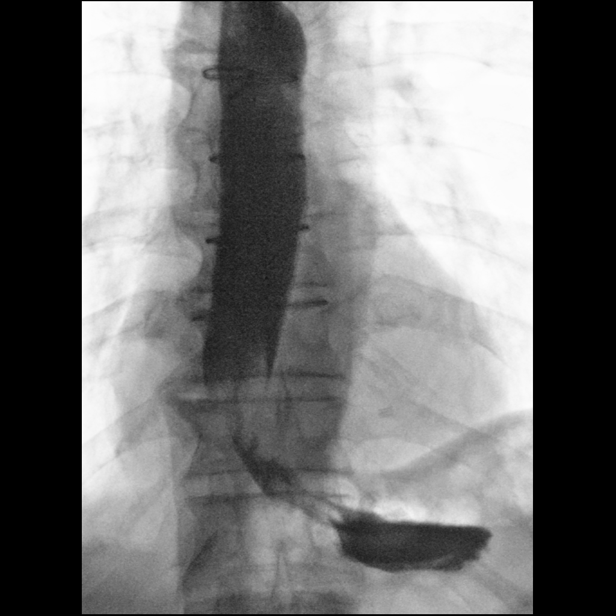

[Series 4: sequence · 1 of 33 frames shown (4 of 8)]
[frame 25/33]
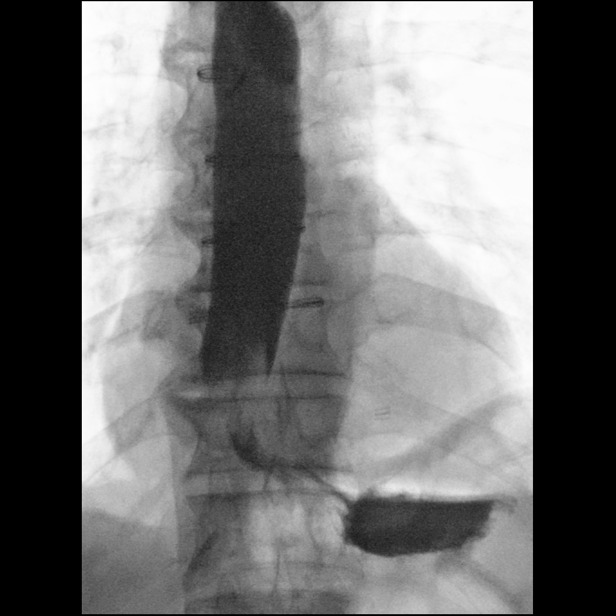

[Series 5: one shot · 2 of 5 slices shown]
[im 2/5]
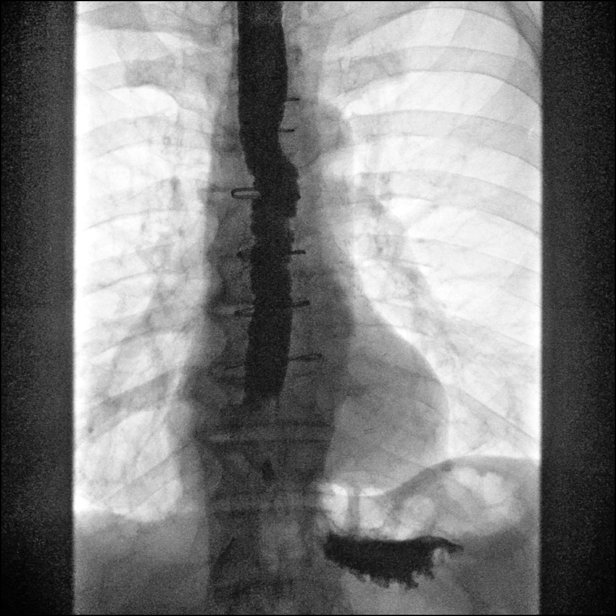
[im 3/5]
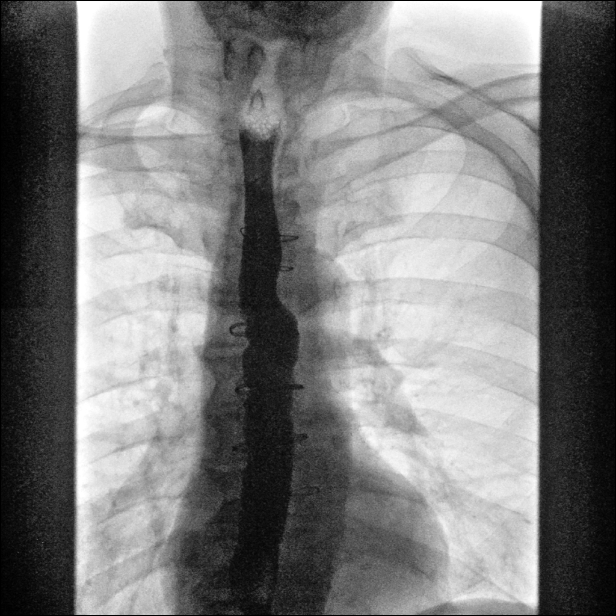

[Series 6: sequence · 2 of 25 frames shown (5 of 8)]
[frame 4/25]
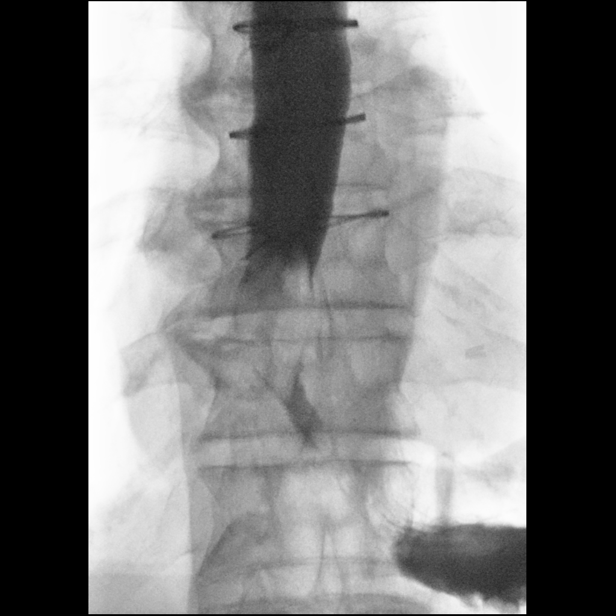
[frame 22/25]
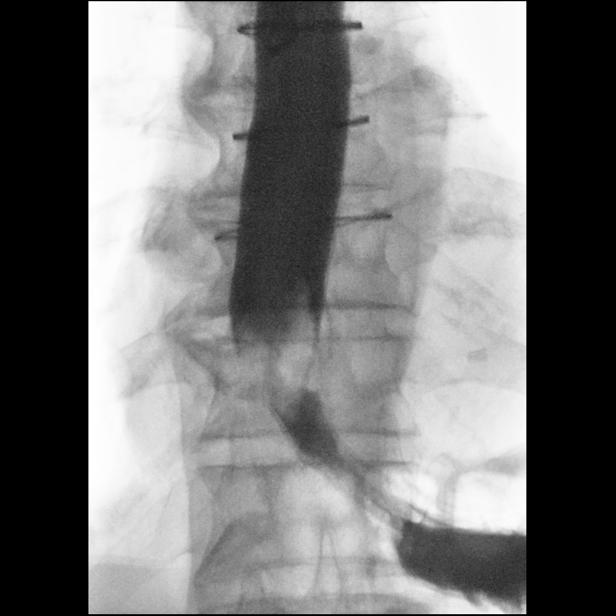

[Series 7: sequence · 1 of 9 frames shown (6 of 8)]
[frame 8/9]
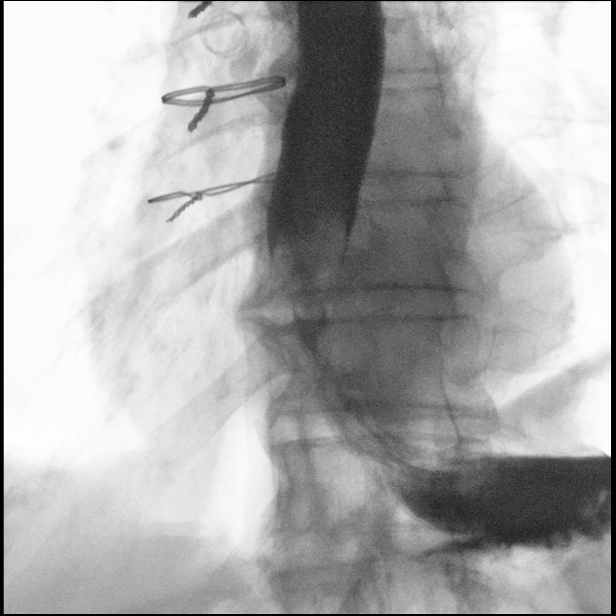

[Series 8: sequence · 1 of 18 frames shown (7 of 8)]
[frame 3/18]
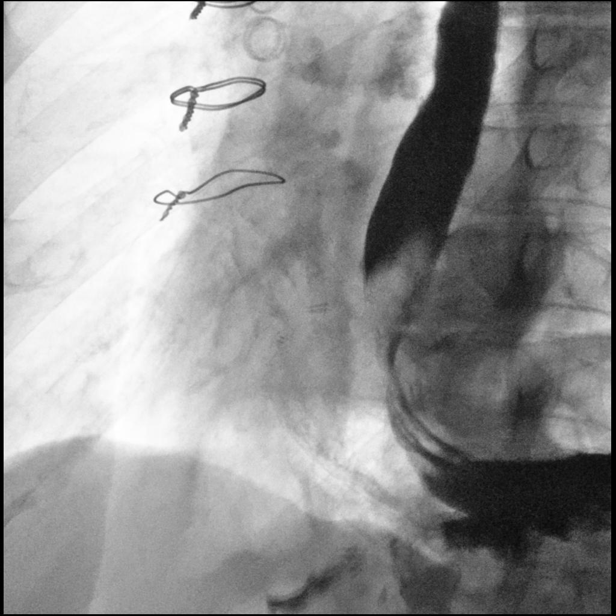

[Series 9: sequence · 2 of 38 frames shown (8 of 8)]
[frame 6/38]
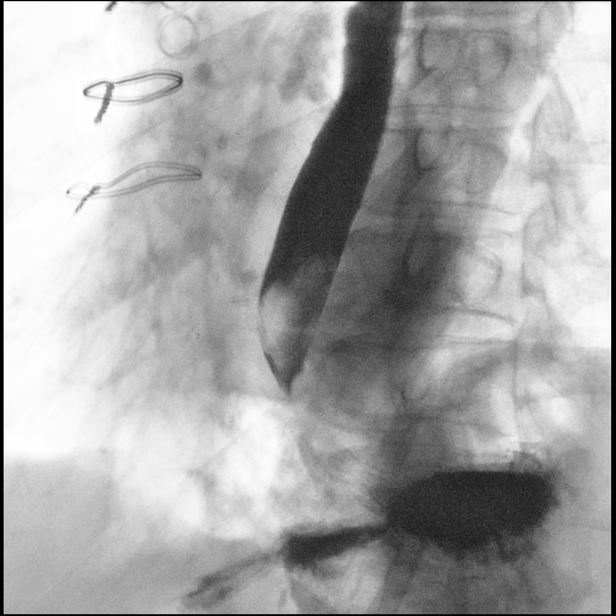
[frame 33/38]
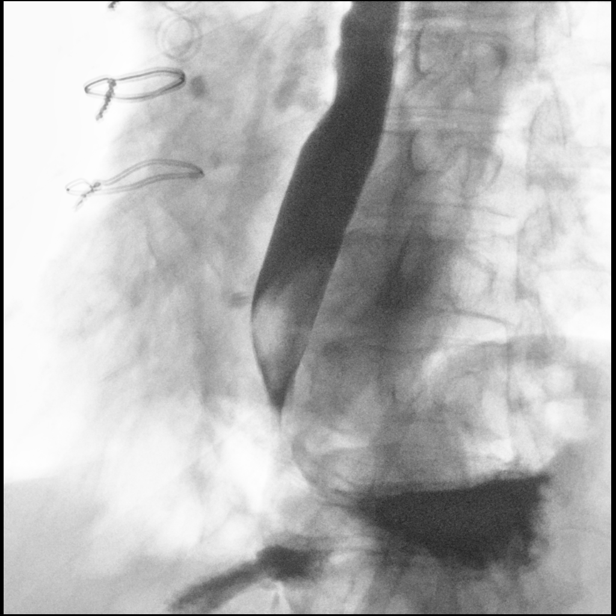

[14 of 24 positions shown; findings below may reference images not displayed]

FINDINGS: A single contrast study was undertaken and the patient tolerated
this well.

Contrast freely flowed into the distal 3rd of the thoracic
esophagus, but once in the lower esophagus a persistent oval filling
defect estimated at 3.9 centimeters in length by 2.4 centimeters
with is at outlined by barium. A small volume of thin barium
intermittently passes around the filling defect, but a high-grade of
obstruction is noted (perhaps 80-90%). See series 9, image 13.

Distal to the impaction the gastroesophageal junction and proximal
stomach appear unremarkable.

The underlying mediastinal contour is normal.  Prior CABG.
IMPRESSION: Positive for food impaction in the distal 3rd esophagus. Filling
defect estimated at 3.9 x 2.4 cm with subsequent high-grade
obstruction..

Study discussed by telephone with Blain in the office of Frederik.
PILERA CAPRICE on 02/13/2018 at 4943 hours.

## 2020-04-19 ENCOUNTER — Other Ambulatory Visit: Payer: Self-pay | Admitting: Internal Medicine

## 2020-04-20 NOTE — Telephone Encounter (Signed)
Pt last saw Dr Ladona Ridgel 08/19/19, last labs 05/16/19 Creat 1.08, age 85, weight 67.4kg, based on specified criteria pt is on appropriate dosage of Eliquis 5mg  BID for a-flutter.  Will refill rx.

## 2020-05-03 DIAGNOSIS — N1831 Chronic kidney disease, stage 3a: Secondary | ICD-10-CM | POA: Diagnosis not present

## 2020-05-03 DIAGNOSIS — E039 Hypothyroidism, unspecified: Secondary | ICD-10-CM | POA: Diagnosis not present

## 2020-05-03 DIAGNOSIS — K2270 Barrett's esophagus without dysplasia: Secondary | ICD-10-CM | POA: Diagnosis not present

## 2020-05-03 DIAGNOSIS — I700 Atherosclerosis of aorta: Secondary | ICD-10-CM | POA: Diagnosis not present

## 2020-05-03 DIAGNOSIS — I25810 Atherosclerosis of coronary artery bypass graft(s) without angina pectoris: Secondary | ICD-10-CM | POA: Diagnosis not present

## 2020-05-03 DIAGNOSIS — I10 Essential (primary) hypertension: Secondary | ICD-10-CM | POA: Diagnosis not present

## 2020-05-03 DIAGNOSIS — I495 Sick sinus syndrome: Secondary | ICD-10-CM | POA: Diagnosis not present

## 2020-05-03 DIAGNOSIS — I779 Disorder of arteries and arterioles, unspecified: Secondary | ICD-10-CM | POA: Diagnosis not present

## 2020-05-03 DIAGNOSIS — E7800 Pure hypercholesterolemia, unspecified: Secondary | ICD-10-CM | POA: Diagnosis not present

## 2020-05-16 ENCOUNTER — Ambulatory Visit (INDEPENDENT_AMBULATORY_CARE_PROVIDER_SITE_OTHER): Payer: Medicare HMO

## 2020-05-16 DIAGNOSIS — I495 Sick sinus syndrome: Secondary | ICD-10-CM

## 2020-05-16 LAB — CUP PACEART REMOTE DEVICE CHECK
Battery Remaining Longevity: 107 mo
Battery Remaining Percentage: 95.5 %
Battery Voltage: 3.02 V
Brady Statistic AP VP Percent: 4.1 %
Brady Statistic AP VS Percent: 24 %
Brady Statistic AS VP Percent: 5.2 %
Brady Statistic AS VS Percent: 64 %
Brady Statistic RA Percent Paced: 22 %
Brady Statistic RV Percent Paced: 8.4 %
Date Time Interrogation Session: 20220405020015
Implantable Lead Implant Date: 20210405
Implantable Lead Implant Date: 20210405
Implantable Lead Location: 753859
Implantable Lead Location: 753860
Implantable Pulse Generator Implant Date: 20210405
Lead Channel Impedance Value: 410 Ohm
Lead Channel Impedance Value: 410 Ohm
Lead Channel Pacing Threshold Amplitude: 0.5 V
Lead Channel Pacing Threshold Amplitude: 0.75 V
Lead Channel Pacing Threshold Pulse Width: 0.5 ms
Lead Channel Pacing Threshold Pulse Width: 0.5 ms
Lead Channel Sensing Intrinsic Amplitude: 12 mV
Lead Channel Sensing Intrinsic Amplitude: 2.6 mV
Lead Channel Setting Pacing Amplitude: 2 V
Lead Channel Setting Pacing Amplitude: 2.5 V
Lead Channel Setting Pacing Pulse Width: 0.5 ms
Lead Channel Setting Sensing Sensitivity: 2 mV
Pulse Gen Model: 2272
Pulse Gen Serial Number: 3815020

## 2020-05-26 NOTE — Progress Notes (Signed)
Remote pacemaker transmission.   

## 2020-08-15 ENCOUNTER — Ambulatory Visit (INDEPENDENT_AMBULATORY_CARE_PROVIDER_SITE_OTHER): Payer: Medicare HMO

## 2020-08-15 DIAGNOSIS — I495 Sick sinus syndrome: Secondary | ICD-10-CM | POA: Diagnosis not present

## 2020-08-15 LAB — CUP PACEART REMOTE DEVICE CHECK
Battery Remaining Longevity: 103 mo
Battery Remaining Percentage: 92 %
Battery Voltage: 3.01 V
Brady Statistic AP VP Percent: 3.4 %
Brady Statistic AP VS Percent: 27 %
Brady Statistic AS VP Percent: 4.3 %
Brady Statistic AS VS Percent: 64 %
Brady Statistic RA Percent Paced: 25 %
Brady Statistic RV Percent Paced: 7.1 %
Date Time Interrogation Session: 20220705020014
Implantable Lead Implant Date: 20210405
Implantable Lead Implant Date: 20210405
Implantable Lead Location: 753859
Implantable Lead Location: 753860
Implantable Pulse Generator Implant Date: 20210405
Lead Channel Impedance Value: 410 Ohm
Lead Channel Impedance Value: 440 Ohm
Lead Channel Pacing Threshold Amplitude: 0.5 V
Lead Channel Pacing Threshold Amplitude: 0.75 V
Lead Channel Pacing Threshold Pulse Width: 0.5 ms
Lead Channel Pacing Threshold Pulse Width: 0.5 ms
Lead Channel Sensing Intrinsic Amplitude: 12 mV
Lead Channel Sensing Intrinsic Amplitude: 2.6 mV
Lead Channel Setting Pacing Amplitude: 2 V
Lead Channel Setting Pacing Amplitude: 2.5 V
Lead Channel Setting Pacing Pulse Width: 0.5 ms
Lead Channel Setting Sensing Sensitivity: 2 mV
Pulse Gen Model: 2272
Pulse Gen Serial Number: 3815020

## 2020-09-05 NOTE — Progress Notes (Signed)
Remote pacemaker transmission.   

## 2020-09-06 ENCOUNTER — Other Ambulatory Visit: Payer: Self-pay | Admitting: Internal Medicine

## 2020-09-27 DIAGNOSIS — H35372 Puckering of macula, left eye: Secondary | ICD-10-CM | POA: Diagnosis not present

## 2020-09-27 DIAGNOSIS — H524 Presbyopia: Secondary | ICD-10-CM | POA: Diagnosis not present

## 2020-09-27 DIAGNOSIS — H26491 Other secondary cataract, right eye: Secondary | ICD-10-CM | POA: Diagnosis not present

## 2020-09-27 DIAGNOSIS — H02401 Unspecified ptosis of right eyelid: Secondary | ICD-10-CM | POA: Diagnosis not present

## 2020-10-03 NOTE — Progress Notes (Signed)
Electrophysiology Office Note Date: 10/04/2020  ID:  Wayne Mitchell, DOB 1935/09/10, MRN [id number]  PCP: Georgann Housekeeper, MD Primary Cardiologist: Lesleigh Noe, MD Electrophysiologist: Lewayne Bunting, MD   CC: Pacemaker follow-up  Wayne Mitchell is a 85 y.o. male seen today for Lewayne Bunting, MD for routine electrophysiology followup.  Since last being seen in our clinic the patient reports doing last visit.  he denies chest pain, palpitations, dyspnea, PND, orthopnea, nausea, vomiting, dizziness, syncope, edema, weight gain, or early satiety.  Device History: St. Jude Dual Chamber PPM implanted 05/2019 for SND  Past Medical History:  Diagnosis Date   Carotid artery disease (HCC)    Colon polyp    small   Coronary artery disease    Coronary artery disease involving native coronary artery of native heart with unstable angina pectoris (HCC) 05/30/2017   3V CABG 06/02/17   HTN (hypertension)    Hx of CABG    Hypothyroidism    Syncope    Syncope    Thrombocytopenia (HCC)    Past Surgical History:  Procedure Laterality Date   A-FLUTTER ABLATION N/A 01/15/2019   Procedure: A-FLUTTER ABLATION;  Surgeon: Duke Salvia, MD;  Location: [facility] INVASIVE CV LAB;  Service: Cardiovascular;  Laterality: N/A;   BALLOON DILATION N/A 02/13/2018   Procedure: BALLOON DILATION;  Surgeon: Kerin Salen, MD;  Location: [facility] ENDOSCOPY;  Service: Gastroenterology;  Laterality: N/A;   BIOPSY  02/13/2018   Procedure: BIOPSY;  Surgeon: Kerin Salen, MD;  Location: [facility] ENDOSCOPY;  Service: Gastroenterology;;   CARDIOVERSION N/A 12/21/2018   Procedure: CARDIOVERSION;  Surgeon: Chrystie Nose, MD;  Location: [facility] ENDOSCOPY;  Service: Cardiovascular;  Laterality: N/A;   CATARACT EXTRACTION Left    CORONARY ARTERY BYPASS GRAFT N/A 06/02/2017   Procedure: CORONARY ARTERY BYPASS GRAFTING (CABG) x3 Using Left Internal Mammary Artery and Right Leg Greater Saphenous Vein harvested endoscopically;  Surgeon: Kerin Perna, MD;  Location: [facility] OR;  Service: Open Heart Surgery;  Laterality: N/A;   ESOPHAGOGASTRODUODENOSCOPY N/A 10/23/2014   Procedure: ESOPHAGOGASTRODUODENOSCOPY (EGD);  Surgeon: Bernette Redbird, MD;  Location: [facility] ENDOSCOPY;  Service: Endoscopy;  Laterality: N/A;   ESOPHAGOGASTRODUODENOSCOPY (EGD) WITH PROPOFOL N/A 02/13/2018   Procedure: ESOPHAGOGASTRODUODENOSCOPY (EGD) WITH PROPOFOL;  Surgeon: Kerin Salen, MD;  Location: [facility] ENDOSCOPY;  Service: Gastroenterology;  Laterality: N/A;   FOREIGN BODY REMOVAL  02/13/2018   Procedure: FOREIGN BODY REMOVAL;  Surgeon: Kerin Salen, MD;  Location: [facility] ENDOSCOPY;  Service: Gastroenterology;;   GAS/FLUID EXCHANGE Right 10/24/2017   Procedure: GAS/FLUID EXCHANGE;  Surgeon: Carmela Rima, MD;  Location: [facility] OR;  Service: Ophthalmology;  Laterality: Right;  SF6   HEMORRHOID SURGERY     LEFT HEART CATH AND CORONARY ANGIOGRAPHY N/A 05/30/2017   Procedure: LEFT HEART CATH AND CORONARY ANGIOGRAPHY;  Surgeon: [name], MD;  Location: [facility] INVASIVE CV LAB;  Service: Cardiovascular;  Laterality: N/A;   PACEMAKER IMPLANT N/A 05/17/2019   Procedure: PACEMAKER IMPLANT;  Surgeon: Marinus Maw, MD;  Location: [facility] INVASIVE CV LAB;  Service: Cardiovascular;  Laterality: N/A;   PARS PLANA VITRECTOMY Right 10/24/2017   Procedure: PARS PLANA VITRECTOMY WITH 25 GAUGE;  Surgeon: Carmela Rima, MD;  Location: [facility] OR;  Service: Ophthalmology;  Laterality: Right;   PHOTOCOAGULATION Right 10/24/2017   Procedure: PHOTOCOAGULATION;  Surgeon: Carmela Rima, MD;  Location: [facility] OR;  Service: Ophthalmology;  Laterality: Right;   REFRACTIVE SURGERY Right    TEE WITHOUT CARDIOVERSION N/A 06/02/2017   Procedure: TRANSESOPHAGEAL ECHOCARDIOGRAM (TEE);  Surgeon: Donata Clay, Theron Arista, MD;  Location: [facility] OR;  Service: Open Heart Surgery;  Laterality: N/A;    Current Outpatient Medications  Medication Sig Dispense Refill   amLODipine (NORVASC) 5 MG tablet 5 mg daily.     atorvastatin (LIPITOR) 20 MG  tablet Take 20 mg by mouth daily.     carvedilol (COREG) 6.25 MG tablet Take 1 tablet (6.25 mg total) by mouth 2 (two) times daily. Patient needs appointment for any future refills. Please call office at [phone] to schedule appointment. 1st attempt. 60 tablet 0   ELIQUIS 5 MG TABS tablet TAKE 1 TABLET TWICE DAILY 180 tablet 1   furosemide (LASIX) 20 MG tablet TAKE 1 TABLET EVERY DAY 90 tablet 3   levothyroxine (SYNTHROID, LEVOTHROID) 112 MCG tablet Take 112 mcg by mouth daily before breakfast.     pantoprazole (PROTONIX) 40 MG tablet Take 40 mg by mouth 2 (two) times daily.      No current facility-administered medications for this visit.    Allergies:   Lisinopril   Social History: Social History   Socioeconomic History   Marital status: Married    Spouse name: Not on file   Number of children: Not on file   Years of education: Not on file   Highest education level: Not on file  Occupational History   Occupation: retired  Tobacco Use   Smoking status: Never   Smokeless tobacco: Never  Vaping Use   Vaping Use: Never used  Substance and Sexual Activity   Alcohol use: Yes    Alcohol/week: 4.0 standard drinks    Types: 4 Cans of beer per week   Drug use: No   Sexual activity: Not on file  Other Topics Concern   Not on file  Social History Narrative   Not on file   Social Determinants of Health   Financial Resource Strain: Not on file  Food Insecurity: Not on file  Transportation Needs: Not on file  Physical Activity: Not on file  Stress: Not on file  Social Connections: Not on file  Intimate Partner Violence: Not on file    Family History: Family History  Problem Relation Age of Onset   Pancreatic cancer Mother    Healthy Brother      Review of Systems: All other systems reviewed and are otherwise negative except as noted above.  Physical Exam: Vitals:   10/04/20 1220  BP: 132/68  Pulse: 60  SpO2: 97%  Weight: 147 lb 3.2 oz (66.8 kg)  Height: 5'  8" (1.727 m)     GEN- The patient is well appearing, alert and oriented x 3 today.   HEENT: normocephalic, atraumatic; sclera clear, conjunctiva pink; hearing intact; oropharynx clear; neck supple  Lungs- Clear to ausculation bilaterally, normal work of breathing.  No wheezes, rales, rhonchi Heart- Regular rate and rhythm, no murmurs, rubs or gallops  GI- soft, non-tender, non-distended, bowel sounds present  Extremities- no clubbing or cyanosis. No edema MS- no significant deformity or atrophy Skin- warm and dry, no rash or lesion; PPM pocket well healed Psych- euthymic mood, full affect Neuro- strength and sensation are intact  PPM Interrogation- reviewed in detail today,  See PACEART report  EKG:  EKG is ordered today. Personal review of ekg ordered today shows AP VS at 60 bpm, QRS 130 ms   Recent Labs: No results found for requested labs within last 8760 hours.   Wt Readings from Last 3 Encounters:  10/04/20 147 lb 3.2 oz (66.8 kg)  08/19/19 148 lb 9.6 oz (67.4 kg)  05/18/19 152 lb 1.9 oz (69 kg)     Other studies Reviewed: Additional studies/ records that were reviewed today include: Previous EP office notes, Previous remote checks, Most recent labwork.   Assessment and Plan:  1. SND s/p St. Jude PPM  Normal PPM function See Pace Art report No changes today  2. CAD  Denies ischemic symptoms  3. AF/flutter Burden 8.7% Continue eliquis for CHA2DS2-VASc of at least 5 Continue coreg 6.25 mg BID     Current medicines are reviewed at length with the patient today.   The patient does not have concerns regarding his medicines.  The following changes were made today:  none  Labs/ tests ordered today include:  Orders Placed This Encounter  Procedures   Basic metabolic panel   CBC    Disposition:   Follow up with Dr. Ladona Ridgel or EP APP in 12 Months    Signed, Luane School  10/04/2020 12:28 PM  [facility] [address] [place] [address] [phone] (office) [phone] (fax)

## 2020-10-04 ENCOUNTER — Ambulatory Visit (INDEPENDENT_AMBULATORY_CARE_PROVIDER_SITE_OTHER): Payer: Medicare HMO | Admitting: Student

## 2020-10-04 ENCOUNTER — Other Ambulatory Visit: Payer: Self-pay

## 2020-10-04 ENCOUNTER — Encounter: Payer: Self-pay | Admitting: Student

## 2020-10-04 VITALS — BP 132/68 | HR 60 | Ht 68.0 in | Wt 147.2 lb

## 2020-10-04 DIAGNOSIS — I483 Typical atrial flutter: Secondary | ICD-10-CM | POA: Diagnosis not present

## 2020-10-04 DIAGNOSIS — I495 Sick sinus syndrome: Secondary | ICD-10-CM | POA: Diagnosis not present

## 2020-10-04 DIAGNOSIS — I4510 Unspecified right bundle-branch block: Secondary | ICD-10-CM | POA: Diagnosis not present

## 2020-10-04 LAB — CUP PACEART INCLINIC DEVICE CHECK
Battery Remaining Longevity: 112 mo
Battery Voltage: 3.01 V
Brady Statistic RA Percent Paced: 26 %
Brady Statistic RV Percent Paced: 7 %
Date Time Interrogation Session: 20220824125938
Implantable Lead Implant Date: 20210405
Implantable Lead Implant Date: 20210405
Implantable Lead Location: 753859
Implantable Lead Location: 753860
Implantable Pulse Generator Implant Date: 20210405
Lead Channel Impedance Value: 450 Ohm
Lead Channel Impedance Value: 462.5 Ohm
Lead Channel Pacing Threshold Amplitude: 0.75 V
Lead Channel Pacing Threshold Amplitude: 0.75 V
Lead Channel Pacing Threshold Amplitude: 0.75 V
Lead Channel Pacing Threshold Amplitude: 0.75 V
Lead Channel Pacing Threshold Pulse Width: 0.5 ms
Lead Channel Pacing Threshold Pulse Width: 0.5 ms
Lead Channel Pacing Threshold Pulse Width: 0.5 ms
Lead Channel Pacing Threshold Pulse Width: 0.5 ms
Lead Channel Sensing Intrinsic Amplitude: 12 mV
Lead Channel Sensing Intrinsic Amplitude: 2.9 mV
Lead Channel Setting Pacing Amplitude: 2 V
Lead Channel Setting Pacing Amplitude: 2.5 V
Lead Channel Setting Pacing Pulse Width: 0.5 ms
Lead Channel Setting Sensing Sensitivity: 2 mV
Pulse Gen Model: 2272
Pulse Gen Serial Number: 3815020

## 2020-10-04 NOTE — Patient Instructions (Signed)
Medication Instructions:  Your physician recommends that you continue on your current medications as directed. Please refer to the Current Medication list given to you today.  *If you need a refill on your cardiac medications before your next appointment, please call your pharmacy*   Lab Work: TODAY: BMET, CBC  If you have labs (blood work) drawn today and your tests are completely normal, you will receive your results only by: MyChart Message (if you have MyChart) OR A paper copy in the mail If you have any lab test that is abnormal or we need to change your treatment, we will call you to review the results.  Follow-Up: At [facility], you and your health needs are our priority.  As part of our continuing mission to provide you with exceptional heart care, we have created designated Provider Care Teams.  These Care Teams include your primary Cardiologist (physician) and Advanced Practice Providers (APPs -  Physician Assistants and Nurse Practitioners) who all work together to provide you with the care you need, when you need it.   Your next appointment:   1 year(s)  The format for your next appointment:   In Person  Provider:   You may see Gregg Taylor, MD or one of the following Advanced Practice Providers on your designated Care Team:   Renee Ursuy, PA-C Michael "Andy" Tillery, PA-C   

## 2020-10-05 LAB — CBC
Hematocrit: 38 % (ref 37.5–51.0)
Hemoglobin: 12.9 g/dL — ABNORMAL LOW (ref 13.0–17.7)
MCH: 29.4 pg (ref 26.6–33.0)
MCHC: 33.9 g/dL (ref 31.5–35.7)
MCV: 87 fL (ref 79–97)
Platelets: 75 10*3/uL — CL (ref 150–450)
RBC: 4.39 x10E6/uL (ref 4.14–5.80)
RDW: 14.3 % (ref 11.6–15.4)
WBC: 7.2 10*3/uL (ref 3.4–10.8)

## 2020-10-05 LAB — BASIC METABOLIC PANEL WITH GFR
BUN/Creatinine Ratio: 11 (ref 10–24)
BUN: 14 mg/dL (ref 8–27)
CO2: 25 mmol/L (ref 20–29)
Calcium: 8.7 mg/dL (ref 8.6–10.2)
Chloride: 99 mmol/L (ref 96–106)
Creatinine, Ser: 1.31 mg/dL — ABNORMAL HIGH (ref 0.76–1.27)
Glucose: 101 mg/dL — ABNORMAL HIGH (ref 65–99)
Potassium: 4.2 mmol/L (ref 3.5–5.2)
Sodium: 139 mmol/L (ref 134–144)
eGFR: 54 mL/min/1.73 — ABNORMAL LOW

## 2020-10-05 NOTE — Addendum Note (Signed)
Addended by: Winifred Olive on: 10/05/2020 04:10 PM   Modules accepted: Orders

## 2020-10-11 ENCOUNTER — Telehealth: Payer: Self-pay | Admitting: Internal Medicine

## 2020-10-11 MED ORDER — CARVEDILOL 6.25 MG PO TABS: 6.2500 mg | ORAL_TABLET | Freq: Two times a day (BID) | ORAL | 3 refills | Status: DC

## 2020-10-11 NOTE — Telephone Encounter (Signed)
Pt's medication was sent to pt's pharmacy as requested. Confirmation received.  °

## 2020-10-11 NOTE — Telephone Encounter (Signed)
*  STAT* If patient is at the pharmacy, call can be transferred to refill team.   1. Which medications need to be refilled? (please list name of each medication and dose if known) carvedilol (COREG) 6.25 MG tablet  2. Which pharmacy/location (including street and city if local pharmacy) is medication to be sent to? [facility] ([facility]) - [place], [place] - [address]  3. Do they need a 30 day or 90 day supply? 90 day  Patient was seen 10/04/2020

## 2020-10-13 ENCOUNTER — Other Ambulatory Visit: Payer: Self-pay

## 2020-10-13 ENCOUNTER — Ambulatory Visit (HOSPITAL_COMMUNITY)
Admission: RE | Admit: 2020-10-13 | Discharge: 2020-10-13 | Disposition: A | Discharge status: Home / Self Care | Payer: Medicare HMO | Source: Ambulatory Visit | Attending: Cardiovascular Disease | Admitting: Cardiovascular Disease

## 2020-10-13 ENCOUNTER — Other Ambulatory Visit (HOSPITAL_COMMUNITY): Payer: Self-pay | Admitting: Interventional Cardiology

## 2020-10-13 DIAGNOSIS — I6523 Occlusion and stenosis of bilateral carotid arteries: Secondary | ICD-10-CM

## 2020-11-08 ENCOUNTER — Other Ambulatory Visit: Payer: Self-pay | Admitting: Internal Medicine

## 2020-11-08 DIAGNOSIS — Z1389 Encounter for screening for other disorder: Secondary | ICD-10-CM | POA: Diagnosis not present

## 2020-11-08 DIAGNOSIS — Z23 Encounter for immunization: Secondary | ICD-10-CM | POA: Diagnosis not present

## 2020-11-08 DIAGNOSIS — I700 Atherosclerosis of aorta: Secondary | ICD-10-CM | POA: Diagnosis not present

## 2020-11-08 DIAGNOSIS — D696 Thrombocytopenia, unspecified: Secondary | ICD-10-CM | POA: Diagnosis not present

## 2020-11-08 DIAGNOSIS — E039 Hypothyroidism, unspecified: Secondary | ICD-10-CM | POA: Diagnosis not present

## 2020-11-08 DIAGNOSIS — I4892 Unspecified atrial flutter: Secondary | ICD-10-CM | POA: Diagnosis not present

## 2020-11-08 DIAGNOSIS — I779 Disorder of arteries and arterioles, unspecified: Secondary | ICD-10-CM | POA: Diagnosis not present

## 2020-11-08 DIAGNOSIS — I10 Essential (primary) hypertension: Secondary | ICD-10-CM | POA: Diagnosis not present

## 2020-11-08 DIAGNOSIS — I25810 Atherosclerosis of coronary artery bypass graft(s) without angina pectoris: Secondary | ICD-10-CM | POA: Diagnosis not present

## 2020-11-08 DIAGNOSIS — E7800 Pure hypercholesterolemia, unspecified: Secondary | ICD-10-CM | POA: Diagnosis not present

## 2020-11-08 DIAGNOSIS — R7303 Prediabetes: Secondary | ICD-10-CM | POA: Diagnosis not present

## 2020-11-08 DIAGNOSIS — Z0000 Encounter for general adult medical examination without abnormal findings: Secondary | ICD-10-CM | POA: Diagnosis not present

## 2020-11-08 DIAGNOSIS — K2270 Barrett's esophagus without dysplasia: Secondary | ICD-10-CM | POA: Diagnosis not present

## 2020-11-08 NOTE — Telephone Encounter (Signed)
Prescription refill request for Eliquis received. Indication:Afib  Last office visit: 10/04/20 Lanna Poche)  Scr: 1.31 (10/04/20) Age: 85 Weight: 66.8kg  Appropriate dose and refill sent to requested pharmacy.

## 2020-11-14 ENCOUNTER — Ambulatory Visit (INDEPENDENT_AMBULATORY_CARE_PROVIDER_SITE_OTHER): Payer: Medicare HMO

## 2020-11-14 DIAGNOSIS — I495 Sick sinus syndrome: Secondary | ICD-10-CM | POA: Diagnosis not present

## 2020-11-14 LAB — CUP PACEART REMOTE DEVICE CHECK
Battery Remaining Longevity: 102 mo
Battery Remaining Percentage: 90 %
Battery Voltage: 3.01 V
Brady Statistic AP VP Percent: 2.1 %
Brady Statistic AP VS Percent: 36 %
Brady Statistic AS VP Percent: 1.3 %
Brady Statistic AS VS Percent: 59 %
Brady Statistic RA Percent Paced: 35 %
Brady Statistic RV Percent Paced: 3.3 %
Date Time Interrogation Session: 20221004020014
Implantable Lead Implant Date: 20210405
Implantable Lead Implant Date: 20210405
Implantable Lead Location: 753859
Implantable Lead Location: 753860
Implantable Pulse Generator Implant Date: 20210405
Lead Channel Impedance Value: 440 Ohm
Lead Channel Impedance Value: 450 Ohm
Lead Channel Pacing Threshold Amplitude: 0.75 V
Lead Channel Pacing Threshold Amplitude: 0.75 V
Lead Channel Pacing Threshold Pulse Width: 0.5 ms
Lead Channel Pacing Threshold Pulse Width: 0.5 ms
Lead Channel Sensing Intrinsic Amplitude: 12 mV
Lead Channel Sensing Intrinsic Amplitude: 2.6 mV
Lead Channel Setting Pacing Amplitude: 2 V
Lead Channel Setting Pacing Amplitude: 2.5 V
Lead Channel Setting Pacing Pulse Width: 0.5 ms
Lead Channel Setting Sensing Sensitivity: 2 mV
Pulse Gen Model: 2272
Pulse Gen Serial Number: 3815020

## 2020-11-22 NOTE — Progress Notes (Signed)
Remote pacemaker transmission.   

## 2021-02-05 ENCOUNTER — Other Ambulatory Visit: Payer: Self-pay | Admitting: Interventional Cardiology

## 2021-02-13 ENCOUNTER — Ambulatory Visit (INDEPENDENT_AMBULATORY_CARE_PROVIDER_SITE_OTHER): Payer: Medicare HMO

## 2021-02-13 DIAGNOSIS — I495 Sick sinus syndrome: Secondary | ICD-10-CM

## 2021-02-13 LAB — CUP PACEART REMOTE DEVICE CHECK
Battery Remaining Longevity: 97 mo
Battery Remaining Percentage: 87 %
Battery Voltage: 3.01 V
Brady Statistic AP VP Percent: 3.1 %
Brady Statistic AP VS Percent: 35 %
Brady Statistic AS VP Percent: 2.1 %
Brady Statistic AS VS Percent: 57 %
Brady Statistic RA Percent Paced: 34 %
Brady Statistic RV Percent Paced: 5.1 %
Date Time Interrogation Session: 20230103020014
Implantable Lead Implant Date: 20210405
Implantable Lead Implant Date: 20210405
Implantable Lead Location: 753859
Implantable Lead Location: 753860
Implantable Pulse Generator Implant Date: 20210405
Lead Channel Impedance Value: 410 Ohm
Lead Channel Impedance Value: 430 Ohm
Lead Channel Pacing Threshold Amplitude: 0.75 V
Lead Channel Pacing Threshold Amplitude: 0.75 V
Lead Channel Pacing Threshold Pulse Width: 0.5 ms
Lead Channel Pacing Threshold Pulse Width: 0.5 ms
Lead Channel Sensing Intrinsic Amplitude: 12 mV
Lead Channel Sensing Intrinsic Amplitude: 2.6 mV
Lead Channel Setting Pacing Amplitude: 2 V
Lead Channel Setting Pacing Amplitude: 2.5 V
Lead Channel Setting Pacing Pulse Width: 0.5 ms
Lead Channel Setting Sensing Sensitivity: 2 mV
Pulse Gen Model: 2272
Pulse Gen Serial Number: 3815020

## 2021-02-23 ENCOUNTER — Encounter: Payer: Self-pay | Admitting: Internal Medicine

## 2021-02-23 NOTE — Progress Notes (Signed)
Remote pacemaker transmission.   

## 2021-05-09 DIAGNOSIS — N1831 Chronic kidney disease, stage 3a: Secondary | ICD-10-CM | POA: Diagnosis not present

## 2021-05-09 DIAGNOSIS — K2270 Barrett's esophagus without dysplasia: Secondary | ICD-10-CM | POA: Diagnosis not present

## 2021-05-09 DIAGNOSIS — I700 Atherosclerosis of aorta: Secondary | ICD-10-CM | POA: Diagnosis not present

## 2021-05-09 DIAGNOSIS — I25810 Atherosclerosis of coronary artery bypass graft(s) without angina pectoris: Secondary | ICD-10-CM | POA: Diagnosis not present

## 2021-05-09 DIAGNOSIS — I10 Essential (primary) hypertension: Secondary | ICD-10-CM | POA: Diagnosis not present

## 2021-05-09 DIAGNOSIS — E7800 Pure hypercholesterolemia, unspecified: Secondary | ICD-10-CM | POA: Diagnosis not present

## 2021-05-09 DIAGNOSIS — D696 Thrombocytopenia, unspecified: Secondary | ICD-10-CM | POA: Diagnosis not present

## 2021-05-09 DIAGNOSIS — D6869 Other thrombophilia: Secondary | ICD-10-CM | POA: Diagnosis not present

## 2021-05-09 DIAGNOSIS — R7303 Prediabetes: Secondary | ICD-10-CM | POA: Diagnosis not present

## 2021-05-15 ENCOUNTER — Ambulatory Visit (INDEPENDENT_AMBULATORY_CARE_PROVIDER_SITE_OTHER): Payer: Medicare HMO

## 2021-05-15 DIAGNOSIS — I495 Sick sinus syndrome: Secondary | ICD-10-CM | POA: Diagnosis not present

## 2021-05-16 LAB — CUP PACEART REMOTE DEVICE CHECK
Battery Remaining Longevity: 95 mo
Battery Remaining Percentage: 85 %
Battery Voltage: 3.01 V
Brady Statistic AP VP Percent: 3 %
Brady Statistic AP VS Percent: 35 %
Brady Statistic AS VP Percent: 2.2 %
Brady Statistic AS VS Percent: 58 %
Brady Statistic RA Percent Paced: 33 %
Brady Statistic RV Percent Paced: 5.2 %
Date Time Interrogation Session: 20230404020014
Implantable Lead Implant Date: 20210405
Implantable Lead Implant Date: 20210405
Implantable Lead Location: 753859
Implantable Lead Location: 753860
Implantable Pulse Generator Implant Date: 20210405
Lead Channel Impedance Value: 440 Ohm
Lead Channel Impedance Value: 450 Ohm
Lead Channel Pacing Threshold Amplitude: 0.75 V
Lead Channel Pacing Threshold Amplitude: 0.75 V
Lead Channel Pacing Threshold Pulse Width: 0.5 ms
Lead Channel Pacing Threshold Pulse Width: 0.5 ms
Lead Channel Sensing Intrinsic Amplitude: 12 mV
Lead Channel Sensing Intrinsic Amplitude: 2.4 mV
Lead Channel Setting Pacing Amplitude: 2 V
Lead Channel Setting Pacing Amplitude: 2.5 V
Lead Channel Setting Pacing Pulse Width: 0.5 ms
Lead Channel Setting Sensing Sensitivity: 2 mV
Pulse Gen Model: 2272
Pulse Gen Serial Number: 3815020

## 2021-05-31 NOTE — Progress Notes (Signed)
Remote pacemaker transmission.   

## 2021-06-09 IMAGING — DX DG CHEST 1V PORT
1 series · 1 of 1 positions shown · non-contrast
Comparison: Chest radiograph from earlier today.

CLINICAL DATA: Questionable right lung base pulmonary nodule on
chest radiograph

EXAM:
PORTABLE CHEST 1 VIEW

[chest ap]
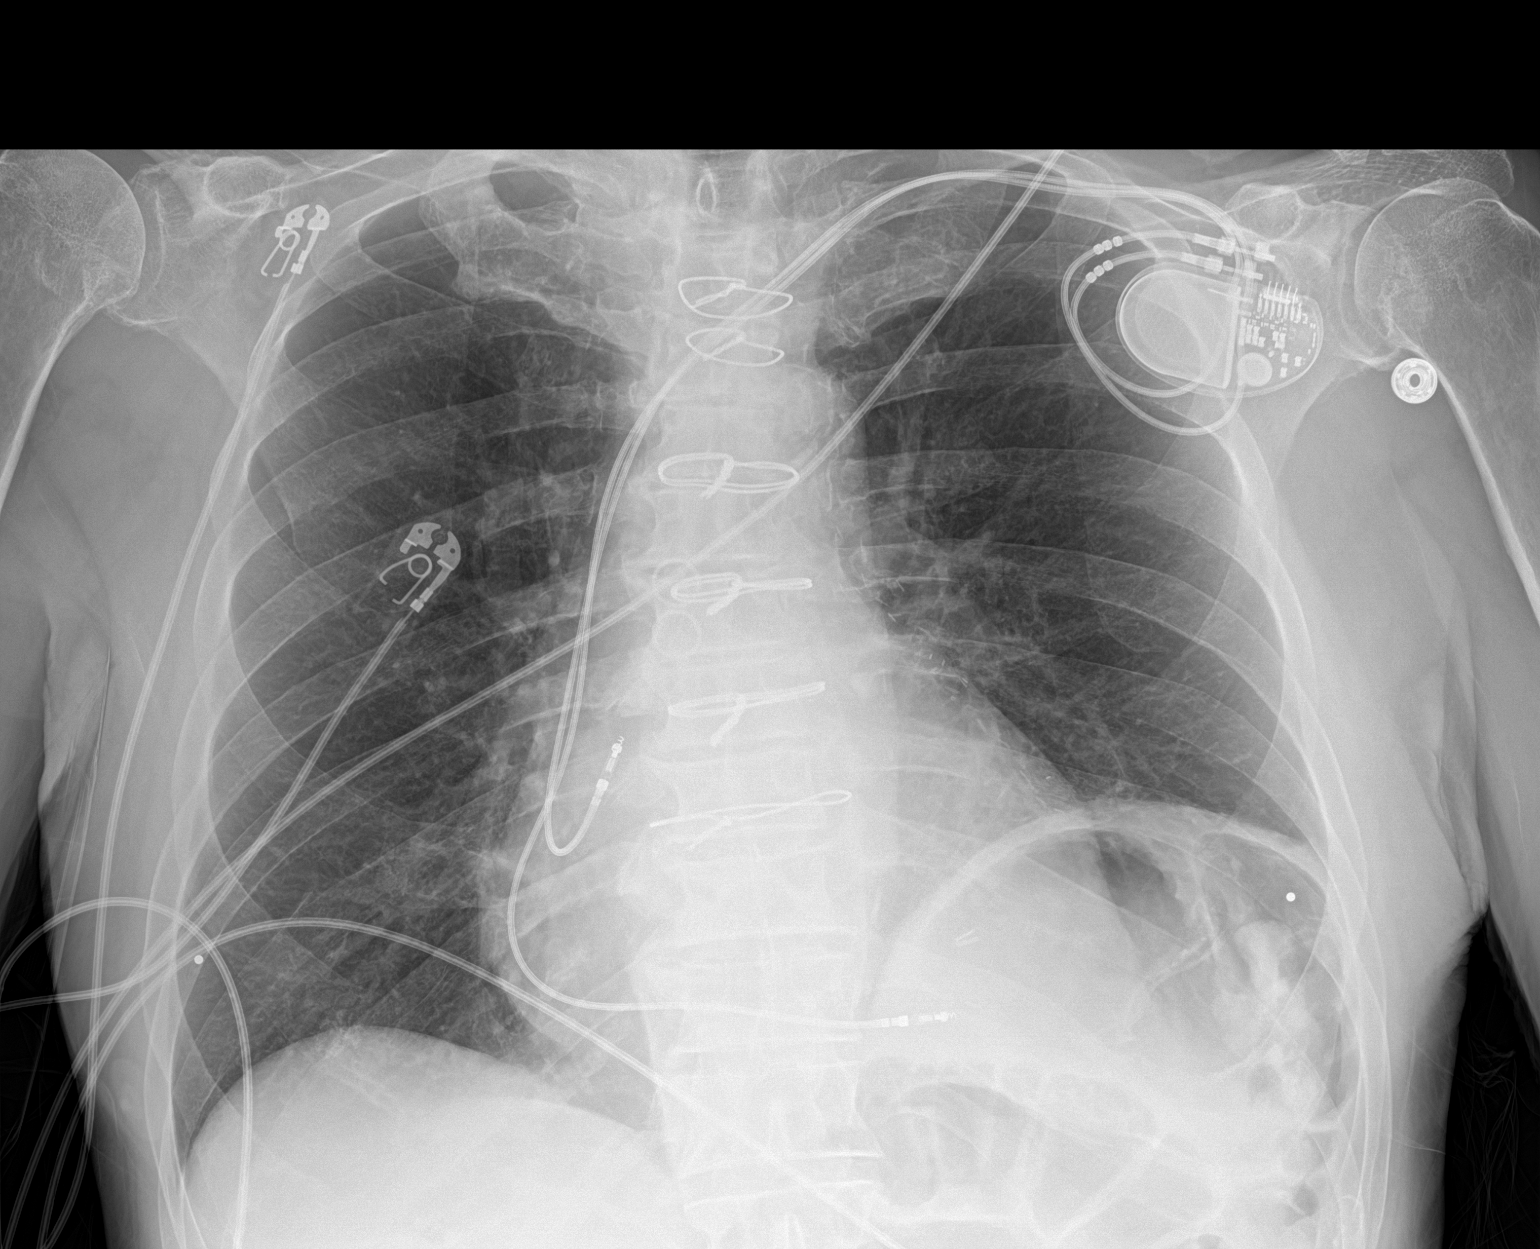

[1 of 1 positions shown; findings below may reference images not displayed]

FINDINGS: Stable configuration of 2 lead left subclavian pacemaker and intact
sternotomy wires. Stable cardiomediastinal silhouette with normal
heart size. No pneumothorax. No pleural effusion. Lungs appear
clear, with no acute consolidative airspace disease and no pulmonary
edema. Previously described nodular opacity at the right lung base
is not discretely apparent on this radiograph. Stable mild elevation
of the left hemidiaphragm.
IMPRESSION: No active disease in the chest. Previously described nodular opacity
at the right lung base is not discretely apparent on this radiograph
and is favored to be artifactual due to summation shadow.

## 2021-08-06 ENCOUNTER — Other Ambulatory Visit: Payer: Self-pay | Admitting: Interventional Cardiology

## 2021-08-15 ENCOUNTER — Ambulatory Visit (INDEPENDENT_AMBULATORY_CARE_PROVIDER_SITE_OTHER): Payer: Medicare HMO

## 2021-08-15 DIAGNOSIS — I495 Sick sinus syndrome: Secondary | ICD-10-CM | POA: Diagnosis not present

## 2021-08-18 LAB — CUP PACEART REMOTE DEVICE CHECK
Battery Remaining Longevity: 93 mo
Battery Remaining Percentage: 83 %
Battery Voltage: 3.01 V
Brady Statistic AP VP Percent: 2.8 %
Brady Statistic AP VS Percent: 34 %
Brady Statistic AS VP Percent: 2 %
Brady Statistic AS VS Percent: 58 %
Brady Statistic RA Percent Paced: 32 %
Brady Statistic RV Percent Paced: 4.7 %
Date Time Interrogation Session: 20230704020011
Implantable Lead Implant Date: 20210405
Implantable Lead Implant Date: 20210405
Implantable Lead Location: 753859
Implantable Lead Location: 753860
Implantable Pulse Generator Implant Date: 20210405
Lead Channel Impedance Value: 410 Ohm
Lead Channel Impedance Value: 450 Ohm
Lead Channel Pacing Threshold Amplitude: 0.75 V
Lead Channel Pacing Threshold Amplitude: 0.75 V
Lead Channel Pacing Threshold Pulse Width: 0.5 ms
Lead Channel Pacing Threshold Pulse Width: 0.5 ms
Lead Channel Sensing Intrinsic Amplitude: 12 mV
Lead Channel Sensing Intrinsic Amplitude: 2.3 mV
Lead Channel Setting Pacing Amplitude: 2 V
Lead Channel Setting Pacing Amplitude: 2.5 V
Lead Channel Setting Pacing Pulse Width: 0.5 ms
Lead Channel Setting Sensing Sensitivity: 2 mV
Pulse Gen Model: 2272
Pulse Gen Serial Number: 3815020

## 2021-09-05 NOTE — Progress Notes (Signed)
Remote pacemaker transmission.   

## 2021-09-26 DIAGNOSIS — H524 Presbyopia: Secondary | ICD-10-CM | POA: Diagnosis not present

## 2021-09-26 DIAGNOSIS — H02401 Unspecified ptosis of right eyelid: Secondary | ICD-10-CM | POA: Diagnosis not present

## 2021-09-26 DIAGNOSIS — H26491 Other secondary cataract, right eye: Secondary | ICD-10-CM | POA: Diagnosis not present

## 2021-09-26 DIAGNOSIS — H31003 Unspecified chorioretinal scars, bilateral: Secondary | ICD-10-CM | POA: Diagnosis not present

## 2021-10-12 ENCOUNTER — Ambulatory Visit (HOSPITAL_COMMUNITY)
Admission: RE | Admit: 2021-10-12 | Discharge: 2021-10-12 | Disposition: A | Discharge status: Home / Self Care | Payer: Medicare HMO | Source: Ambulatory Visit | Attending: Cardiology | Admitting: Cardiology

## 2021-10-12 DIAGNOSIS — I6523 Occlusion and stenosis of bilateral carotid arteries: Secondary | ICD-10-CM | POA: Insufficient documentation

## 2021-10-18 ENCOUNTER — Other Ambulatory Visit: Payer: Self-pay | Admitting: Internal Medicine

## 2021-10-23 DIAGNOSIS — H26491 Other secondary cataract, right eye: Secondary | ICD-10-CM | POA: Diagnosis not present

## 2021-11-13 ENCOUNTER — Ambulatory Visit (INDEPENDENT_AMBULATORY_CARE_PROVIDER_SITE_OTHER): Payer: Medicare HMO

## 2021-11-13 DIAGNOSIS — R7303 Prediabetes: Secondary | ICD-10-CM | POA: Diagnosis not present

## 2021-11-13 DIAGNOSIS — D696 Thrombocytopenia, unspecified: Secondary | ICD-10-CM | POA: Diagnosis not present

## 2021-11-13 DIAGNOSIS — Z0000 Encounter for general adult medical examination without abnormal findings: Secondary | ICD-10-CM | POA: Diagnosis not present

## 2021-11-13 DIAGNOSIS — I25810 Atherosclerosis of coronary artery bypass graft(s) without angina pectoris: Secondary | ICD-10-CM | POA: Diagnosis not present

## 2021-11-13 DIAGNOSIS — E7800 Pure hypercholesterolemia, unspecified: Secondary | ICD-10-CM | POA: Diagnosis not present

## 2021-11-13 DIAGNOSIS — I700 Atherosclerosis of aorta: Secondary | ICD-10-CM | POA: Diagnosis not present

## 2021-11-13 DIAGNOSIS — I10 Essential (primary) hypertension: Secondary | ICD-10-CM | POA: Diagnosis not present

## 2021-11-13 DIAGNOSIS — I495 Sick sinus syndrome: Secondary | ICD-10-CM | POA: Diagnosis not present

## 2021-11-13 DIAGNOSIS — Z23 Encounter for immunization: Secondary | ICD-10-CM | POA: Diagnosis not present

## 2021-11-13 DIAGNOSIS — N1831 Chronic kidney disease, stage 3a: Secondary | ICD-10-CM | POA: Diagnosis not present

## 2021-11-13 DIAGNOSIS — I4892 Unspecified atrial flutter: Secondary | ICD-10-CM | POA: Diagnosis not present

## 2021-11-13 DIAGNOSIS — Z1331 Encounter for screening for depression: Secondary | ICD-10-CM | POA: Diagnosis not present

## 2021-11-13 LAB — CUP PACEART REMOTE DEVICE CHECK
Battery Remaining Longevity: 91 mo
Battery Remaining Percentage: 81 %
Battery Voltage: 3.01 V
Brady Statistic AP VP Percent: 2.7 %
Brady Statistic AP VS Percent: 35 %
Brady Statistic AS VP Percent: 1.9 %
Brady Statistic AS VS Percent: 58 %
Brady Statistic RA Percent Paced: 33 %
Brady Statistic RV Percent Paced: 4.6 %
Date Time Interrogation Session: 20231003020013
Implantable Lead Implant Date: 20210405
Implantable Lead Implant Date: 20210405
Implantable Lead Location: 753859
Implantable Lead Location: 753860
Implantable Pulse Generator Implant Date: 20210405
Lead Channel Impedance Value: 410 Ohm
Lead Channel Impedance Value: 440 Ohm
Lead Channel Pacing Threshold Amplitude: 0.75 V
Lead Channel Pacing Threshold Amplitude: 0.75 V
Lead Channel Pacing Threshold Pulse Width: 0.5 ms
Lead Channel Pacing Threshold Pulse Width: 0.5 ms
Lead Channel Sensing Intrinsic Amplitude: 12 mV
Lead Channel Sensing Intrinsic Amplitude: 2.5 mV
Lead Channel Setting Pacing Amplitude: 2 V
Lead Channel Setting Pacing Amplitude: 2.5 V
Lead Channel Setting Pacing Pulse Width: 0.5 ms
Lead Channel Setting Sensing Sensitivity: 2 mV
Pulse Gen Model: 2272
Pulse Gen Serial Number: 3815020

## 2021-11-19 DIAGNOSIS — H6123 Impacted cerumen, bilateral: Secondary | ICD-10-CM | POA: Diagnosis not present

## 2021-11-26 NOTE — Progress Notes (Signed)
Remote pacemaker transmission.   

## 2022-01-07 ENCOUNTER — Other Ambulatory Visit (HOSPITAL_COMMUNITY): Payer: Self-pay | Admitting: Interventional Cardiology

## 2022-01-07 DIAGNOSIS — I6522 Occlusion and stenosis of left carotid artery: Secondary | ICD-10-CM

## 2022-02-12 ENCOUNTER — Ambulatory Visit (INDEPENDENT_AMBULATORY_CARE_PROVIDER_SITE_OTHER): Payer: Medicare HMO

## 2022-02-12 DIAGNOSIS — I495 Sick sinus syndrome: Secondary | ICD-10-CM | POA: Diagnosis not present

## 2022-02-12 LAB — CUP PACEART REMOTE DEVICE CHECK
Battery Remaining Longevity: 87 mo
Battery Remaining Percentage: 79 %
Battery Voltage: 3.01 V
Brady Statistic AP VP Percent: 2.8 %
Brady Statistic AP VS Percent: 35 %
Brady Statistic AS VP Percent: 1.9 %
Brady Statistic AS VS Percent: 58 %
Brady Statistic RA Percent Paced: 33 %
Brady Statistic RV Percent Paced: 4.7 %
Date Time Interrogation Session: 20240102020014
Implantable Lead Connection Status: 753985
Implantable Lead Connection Status: 753985
Implantable Lead Implant Date: 20210405
Implantable Lead Implant Date: 20210405
Implantable Lead Location: 753859
Implantable Lead Location: 753860
Implantable Pulse Generator Implant Date: 20210405
Lead Channel Impedance Value: 400 Ohm
Lead Channel Impedance Value: 430 Ohm
Lead Channel Pacing Threshold Amplitude: 0.75 V
Lead Channel Pacing Threshold Amplitude: 0.75 V
Lead Channel Pacing Threshold Pulse Width: 0.5 ms
Lead Channel Pacing Threshold Pulse Width: 0.5 ms
Lead Channel Sensing Intrinsic Amplitude: 12 mV
Lead Channel Sensing Intrinsic Amplitude: 2.4 mV
Lead Channel Setting Pacing Amplitude: 2 V
Lead Channel Setting Pacing Amplitude: 2.5 V
Lead Channel Setting Pacing Pulse Width: 0.5 ms
Lead Channel Setting Sensing Sensitivity: 2 mV
Pulse Gen Model: 2272
Pulse Gen Serial Number: 3815020

## 2022-03-15 NOTE — Progress Notes (Signed)
Remote pacemaker transmission.   

## 2022-05-14 ENCOUNTER — Ambulatory Visit (INDEPENDENT_AMBULATORY_CARE_PROVIDER_SITE_OTHER): Payer: Medicare HMO

## 2022-05-14 DIAGNOSIS — I495 Sick sinus syndrome: Secondary | ICD-10-CM

## 2022-05-15 LAB — CUP PACEART REMOTE DEVICE CHECK
Battery Remaining Longevity: 85 mo
Battery Remaining Percentage: 76 %
Battery Voltage: 3.01 V
Brady Statistic AP VP Percent: 2.8 %
Brady Statistic AP VS Percent: 35 %
Brady Statistic AS VP Percent: 2 %
Brady Statistic AS VS Percent: 57 %
Brady Statistic RA Percent Paced: 34 %
Brady Statistic RV Percent Paced: 4.7 %
Date Time Interrogation Session: 20240402020015
Implantable Lead Connection Status: 753985
Implantable Lead Connection Status: 753985
Implantable Lead Implant Date: 20210405
Implantable Lead Implant Date: 20210405
Implantable Lead Location: 753859
Implantable Lead Location: 753860
Implantable Pulse Generator Implant Date: 20210405
Lead Channel Impedance Value: 400 Ohm
Lead Channel Impedance Value: 440 Ohm
Lead Channel Pacing Threshold Amplitude: 0.75 V
Lead Channel Pacing Threshold Amplitude: 0.75 V
Lead Channel Pacing Threshold Pulse Width: 0.5 ms
Lead Channel Pacing Threshold Pulse Width: 0.5 ms
Lead Channel Sensing Intrinsic Amplitude: 12 mV
Lead Channel Sensing Intrinsic Amplitude: 2.7 mV
Lead Channel Setting Pacing Amplitude: 2 V
Lead Channel Setting Pacing Amplitude: 2.5 V
Lead Channel Setting Pacing Pulse Width: 0.5 ms
Lead Channel Setting Sensing Sensitivity: 2 mV
Pulse Gen Model: 2272
Pulse Gen Serial Number: 3815020

## 2022-05-17 DIAGNOSIS — I4892 Unspecified atrial flutter: Secondary | ICD-10-CM | POA: Diagnosis not present

## 2022-05-17 DIAGNOSIS — I495 Sick sinus syndrome: Secondary | ICD-10-CM | POA: Diagnosis not present

## 2022-05-17 DIAGNOSIS — I5032 Chronic diastolic (congestive) heart failure: Secondary | ICD-10-CM | POA: Diagnosis not present

## 2022-05-17 DIAGNOSIS — I700 Atherosclerosis of aorta: Secondary | ICD-10-CM | POA: Diagnosis not present

## 2022-05-17 DIAGNOSIS — N1831 Chronic kidney disease, stage 3a: Secondary | ICD-10-CM | POA: Diagnosis not present

## 2022-05-17 DIAGNOSIS — I130 Hypertensive heart and chronic kidney disease with heart failure and stage 1 through stage 4 chronic kidney disease, or unspecified chronic kidney disease: Secondary | ICD-10-CM | POA: Diagnosis not present

## 2022-05-17 DIAGNOSIS — D6869 Other thrombophilia: Secondary | ICD-10-CM | POA: Diagnosis not present

## 2022-05-17 DIAGNOSIS — I779 Disorder of arteries and arterioles, unspecified: Secondary | ICD-10-CM | POA: Diagnosis not present

## 2022-05-17 DIAGNOSIS — D696 Thrombocytopenia, unspecified: Secondary | ICD-10-CM | POA: Diagnosis not present

## 2022-06-24 NOTE — Progress Notes (Signed)
Remote pacemaker transmission.   

## 2022-07-06 NOTE — Progress Notes (Unsigned)
Cardiology Office Note:   Date:  07/09/2022  ID:  WYLER SOUTHWOOD, DOB Jun 30, 1935, MRN [id number]  History of Present Illness:   Wayne Mitchell is a 87 y.o. male with history of CAD s/p 3V CABG, SSS s/p PPM placement, HTN, hypothyroidism, and carotid artery disease who presents to clinic for follow-up. Follows with Dr. Ladona Ridgel and previously followed with Dr. Katrinka Blazing.  Was last seen in 08/2019 by Dr. Ladona Ridgel. Was doing well at that time.  Today, the patient is overall feeling well. No chest pain, SOB, orthopnea, PND, or LE edema. Blood pressure is well controlled at 120-130s at home. Tolerating his medications as prescribed with rare missed doses. He is able to mow his lawn for without issue.  Has mild orthostatic hypotension with position changes. No syncope. Admits to not hydrating enough. Currently taking lasix daily.  Past Medical History:  Diagnosis Date   Carotid artery disease (HCC)    Colon polyp    small   Coronary artery disease    Coronary artery disease involving native coronary artery of native heart with unstable angina pectoris (HCC) 05/30/2017   3V CABG 06/02/17   HTN (hypertension)    Hx of CABG    Hypothyroidism    Syncope    Syncope    Thrombocytopenia (HCC)      ROS: As per HPI  Studies Reviewed:    EKG:  A-paced with RBBB, HR 60  Cardiac Studies & Procedures   CARDIAC CATHETERIZATION  CARDIAC CATHETERIZATION 05/30/2017  Narrative 1. Critical stenosis at the ostium of the LAD, large vessel, appears to be a suitable target for grafting 2. Severe stenosis of the first OM branch of the circumflex 3. Patent RCA, unable to selectively engage, suspected codominant vessel 4. Mild segmental LV contraction abnormality with LVEF estimated at 50-55%  Plan: IV heparin, TCTS consultation for consideration of CABG  Findings Coronary Findings Diagnostic  Dominance: Left  Left Anterior Descending Ost LAD to Prox LAD lesion is 99% stenosed. The lesion is  calcified. Critical stenosis at the LAD origin, extending back to the left main with no landing zone for stenting  Left Circumflex Prox Cx lesion is 50% stenosed.  First Obtuse Marginal Branch Ost 1st Mrg lesion is 90% stenosed. severe stenosis at the origin of a large first OM branch  Right Coronary Artery The vessel is moderately calcified. The RCA is patent, but could not be selectively injected. Multiple diagnostic and guide catheters are attempted. Suspect nondominant or codominant RCA. No severe stenosis identified.  Intervention  No interventions have been documented.     ECHOCARDIOGRAM  ECHOCARDIOGRAM COMPLETE 06/05/2017  Narrative *[place]* *[facility]* [address] [place], [place] [address] [phone]  ------------------------------------------------------------------- Transthoracic Echocardiography  Patient:    Wayne, Mitchell MR #:       [id number] Study Date: 06/05/2017 Gender:     M Age:        70 Height:     170.2 cm Weight:     80.2 kg BSA:        1.97 m^2 Pt. Status: Room:       2H19C  ATTENDING    Rochel Brome REFERRING    VanTrigt, Genelle Gather    [name], MD PERFORMING   [facility], Inpatient SONOGRAPHER  Sheralyn Boatman  cc:  ------------------------------------------------------------------- LV EF: 60% -   65%  ------------------------------------------------------------------- Indications:      Cardiomyopathy - ischemic 414.8.  -------------------------------------------------------------------  History:   PMH:  SVT. Dizziness.  Syncope.  Angina pectoris. Coronary artery disease.  Risk factors:  Hypertension.  ------------------------------------------------------------------- Study Conclusions  - Procedure narrative: Transthoracic echocardiography. Image quality was poor. The study was technically difficult, as a result of poor acoustic windows and poor sound wave  transmission. - Left ventricle: The cavity size was normal. Wall thickness was increased in a pattern of moderate LVH. Systolic function was normal. The estimated ejection fraction was in the range of 60% to 65%. Incoordinate septal motion. Doppler parameters are consistent with abnormal left ventricular relaxation (grade 1 diastolic dysfunction). The E/e&' ratio is >15, suggesting elevated LV filling pressure. - Aortic valve: Poorly visualized. There was trivial regurgitation. - Mitral valve: Mildly thickened leaflets . There was trivial regurgitation. - Left atrium: The atrium was normal in size.  Impressions:  - Technically difficult study. LVEF 60-65%, moderate LVH, incoordinate septal motion, grade 1 DD, elevated LV filling pressure, trivial AI, trivial MR, normal LA size.  ------------------------------------------------------------------- Labs, prior tests, procedures, and surgery: Coronary artery bypass grafting.  ------------------------------------------------------------------- Study data:  Comparison was made to the study of 06/01/2017.  Study status:  Routine.  Procedure:  Imaging difficult. Chest tube present. Wound dressing in subcostal region. Windows were extremely respiratory dependent. The patient reported no pain pre or post test. Transthoracic echocardiography. Image quality was poor. The study was technically difficult, as a result of poor acoustic windows and poor sound wave transmission.  Study completion:  There were no complications.          Transthoracic echocardiography. M-mode, complete 2D, spectral Doppler, and color Doppler. Birthdate:  Patient birthdate: 1935-07-05.  Age:  Patient is 87 yr old.  Sex:  Gender: male.    BMI: 27.7 kg/m^2.  Blood pressure: 125/68  Patient status:  Inpatient.  Study date:  Study date: 06/05/2017. Study time: 10:31 AM.  Location:   ICU/CCU  -------------------------------------------------------------------  ------------------------------------------------------------------- Left ventricle:  The cavity size was normal. Wall thickness was increased in a pattern of moderate LVH. Systolic function was normal. The estimated ejection fraction was in the range of 60% to 65%. Incoordinate septal motion. Doppler parameters are consistent with abnormal left ventricular relaxation (grade 1 diastolic dysfunction). The E/e&' ratio is >15, suggesting elevated LV filling pressure.  ------------------------------------------------------------------- Aortic valve:  Poorly visualized.  Doppler:  There was trivial regurgitation.  ------------------------------------------------------------------- Aorta:  Aortic root: The aortic root was normal in size. Ascending aorta: The ascending aorta was normal in size.  ------------------------------------------------------------------- Mitral valve:   Mildly thickened leaflets .  Doppler:  There was trivial regurgitation.    Valve area by pressure half-time: 4.23 cm^2. Indexed valve area by pressure half-time: 2.15 cm^2/m^2. Peak gradient (D): 3 mm Hg.  ------------------------------------------------------------------- Left atrium:  The atrium was normal in size.  ------------------------------------------------------------------- Atrial septum:  Poorly visualized.  ------------------------------------------------------------------- Right ventricle:  Poorly visualized.  ------------------------------------------------------------------- Pulmonic valve:   Poorly visualized.  Doppler:  There was no significant regurgitation.  ------------------------------------------------------------------- Tricuspid valve:   Doppler:  There was no significant regurgitation.  ------------------------------------------------------------------- Pulmonary artery:   Poorly  visualized.  ------------------------------------------------------------------- Right atrium:  The atrium was normal in size.  ------------------------------------------------------------------- Pericardium:  There was no pericardial effusion.  ------------------------------------------------------------------- Systemic veins:  Not visualized.  ------------------------------------------------------------------- Measurements  Left ventricle                           Value          Reference LV  ID, ED, PLAX chordal        (L)       31.9  mm       43 - 52 LV ID, ES, PLAX chordal        (L)       20.6  mm       23 - 38 LV fx shortening, PLAX chordal           35    %        >=29 LV PW thickness, ED                      13.7  mm       ---------- IVS/LV PW ratio, ED                      1.04           <=1.3 Stroke volume, 2D                        53    ml       ---------- Stroke volume/bsa, 2D                    27    ml/m^2   ---------- LV ejection fraction, 1-p A4C            50    %        ---------- LV end-diastolic volume, 2-p             57    ml       ---------- LV end-systolic volume, 2-p              23    ml       ---------- LV ejection fraction, 2-p                59    %        ---------- Stroke volume, 2-p                       34    ml       ---------- LV end-diastolic volume/bsa,             29    ml/m^2   ---------- 2-p LV end-systolic volume/bsa,              12    ml/m^2   ---------- 2-p Stroke volume/bsa, 2-p                   17.1  ml/m^2   ---------- LV e&', lateral                           4.68  cm/s     ---------- LV E/e&', lateral                         19.06          ---------- LV e&', medial                            4.46  cm/s     ---------- LV E/e&', medial  20             ---------- LV e&', average                           4.57  cm/s     ---------- LV E/e&', average                         19.52           ----------  Ventricular septum                       Value          Reference IVS thickness, ED                        14.2  mm       ----------  LVOT                                     Value          Reference LVOT ID, S                               21    mm       ---------- LVOT area                                3.46  cm^2     ---------- LVOT peak velocity, S                    103   cm/s     ---------- LVOT mean velocity, S                    63.7  cm/s     ---------- LVOT VTI, S                              15.4  cm       ---------- LVOT peak gradient, S                    4     mm Hg    ----------  Aortic valve                             Value          Reference Aortic regurg pressure                   554   ms       ---------- half-time  Aorta                                    Value          Reference Aortic root ID, ED                       32    mm       ----------  Left atrium  Value          Reference LA ID, A-P, ES                           34    mm       ---------- LA ID/bsa, A-P                           1.73  cm/m^2   <=2.2 LA volume, S                             19.7  ml       ---------- LA volume/bsa, S                         10    ml/m^2   ---------- LA volume, ES, 1-p A4C                   14.5  ml       ---------- LA volume/bsa, ES, 1-p A4C               7.4   ml/m^2   ---------- LA volume, ES, 1-p A2C                   25.4  ml       ---------- LA volume/bsa, ES, 1-p A2C               12.9  ml/m^2   ----------  Mitral valve                             Value          Reference Mitral E-wave peak velocity              89.2  cm/s     ---------- Mitral A-wave peak velocity              62.6  cm/s     ---------- Mitral deceleration time                 176   ms       150 - 230 Mitral pressure half-time                52    ms       ---------- Mitral peak gradient, D                  3     mm Hg    ---------- Mitral E/A ratio, peak                    1.4            ---------- Mitral valve area, PHT, DP               4.23  cm^2     ---------- Mitral valve area/bsa, PHT, DP           2.15  cm^2/m^2 ----------  Right atrium                             Value          Reference RA ID, S-I, ES, A4C  48    mm       34 - 49 RA area, ES, A4C                         12.3  cm^2     8.3 - 19.5 RA volume, ES, A/L                       25.2  ml       ---------- RA volume/bsa, ES, A/L                   12.8  ml/m^2   ----------  Legend: (L)  and  (H)  mark values outside specified reference range.  ------------------------------------------------------------------- Prepared and Electronically Authenticated by  Zoila Shutter MD 2019-04-25T15:21:50   TEE  ECHO TEE 06/02/2017  Narrative *[place]* *[facility]* [address] [place], [place] [address] [phone]  ------------------------------------------------------------------- Intraoperative Transesophageal Echocardiography  Patient:    Hallett, Soter MR #:       [id number] Study Date: 06/02/2017 Gender:     M Age:        16 Height:     170.2 cm Weight:     70.5 kg BSA:        1.83 m^2 Pt. Status: Room:       2H19C  ATTENDING    VanTrigt, Marijean Niemann REFERRING    VanTrigt, Genelle Gather    [name], MD PERFORMING   Jairo Ben, MD SONOGRAPHER  Roosvelt Maser, RDCS  cc:  ------------------------------------------------------------------- LV EF: 45% -   50%  ------------------------------------------------------------------- Indications:     CABG by-pass RWMA  ------------------------------------------------------------------- History:   PMH:  PVD. GERD. Hypothyroidism. Renal disease.  Chest pain.  Acute myocardial infarction.  Coronary artery disease.  Risk factors:  Hypertension.  ------------------------------------------------------------------- Study Conclusions  -  Left ventricle: Systolic function was mildly reduced. The estimated ejection fraction was in the range of 45% to 50%. Mild diffuse hypokinesis. - Mitral valve: There was mild regurgitation directed centrally. - Staged echo: Limited post CPB exam: The patient separated easily from CPB. Overall LV function appears normal, with no segmental wall motion abnormalities. Approximate EF 50-55%. No change post bypass in mitral valve function. Trivial MR remains. No change post bypass in aortic valve function. Trivial AI remains.  Impressions:  - Slight LV function improvement from pre-bypass images. No other significant changes from pre-bypass images.  ------------------------------------------------------------------- Study data:   Study status:  Routine.  Consent:  The risks, benefits, and alternatives to the procedure were explained to the patient and informed consent was obtained.  Procedure:  The patient reported no pain pre or post test. Sedation. General anesthesia was administered by anesthesiology staff. Transesophageal echocardiography. An adult multiplane transesophageal probe was inserted by the anesthesiologistwithout difficulty. Image quality was adequate.  Study completion:  There were no complications. Intraoperative transesophageal echocardiography.  Birthdate: Patient birthdate: Aug 02, 1935.  Age:  Patient is 87 yr old.  Sex: Gender: male.    BMI: 24.3 kg/m^2.  Blood pressure:     113/56 Patient status:  Inpatient.  Study date:  Study date: 06/02/2017. Study time: 07:58 AM.  Location:  Operating room.  -------------------------------------------------------------------  ------------------------------------------------------------------- Left ventricle:  Systolic function was mildly reduced. The estimated ejection fraction was in the range of 45% to 50%. Mild diffuse hypokinesis.  ------------------------------------------------------------------- Aortic valve:    Normal-sized, mildly calcified annulus. Trileaflet;  normal thickness, noncalcified leaflets. Cusp separation was normal. Mobility was not restricted.  No evidence of vegetation. Doppler:  Transvalvular velocity was within the normal range. There was no stenosis. There was trivial regurgitation.  ------------------------------------------------------------------- Aorta:  The aorta was mildly atherosclerotic.  ------------------------------------------------------------------- Mitral valve:   Normal-sized, noncalcified annulus. Structurally normal valve. Normal thickness leaflets . Leaflet separation was normal. Mobility was not restricted. No echocardiographic evidence for prolapse.  No evidence of vegetation.  Doppler:  Transvalvular velocity was within the normal range. There was no evidence for stenosis. There was mild regurgitation directed centrally.  ------------------------------------------------------------------- Left atrium:  The atrium was normal in size.  No evidence of thrombus in the atrial cavity or appendage.  ------------------------------------------------------------------- Atrial septum:  No defect or patent foramen ovale was identified by color doppler interrogation.  ------------------------------------------------------------------- Pulmonary veins:  Visualization of the pulmonary venous anatomy is incomplete, but a significant abnormality is unlikely.  ------------------------------------------------------------------- Right ventricle:  The cavity size was normal. Wall thickness was normal. Systolic function was normal.  ------------------------------------------------------------------- Pulmonic valve:    Structurally normal valve.   Cusp separation was normal.  No evidence of vegetation.  Doppler:  There was trivial regurgitation around the PA catheter.  ------------------------------------------------------------------- Tricuspid valve:   Structurally  normal valve.   Leaflet separation was normal.  No evidence of vegetation.  Doppler:  There was trivial regurgitation.  ------------------------------------------------------------------- Pulmonary artery:   The main pulmonary artery was normal-sized.  ------------------------------------------------------------------- Right atrium:  The atrium was normal in size.  No evidence of thrombus.  ------------------------------------------------------------------- Pre bypass:  Post bypass:  ------------------------------------------------------------------- Post procedure conclusions Left ventricle:  Limited post CPB exam: The patient separated easily from CPB. Overall LV function appears normal, with no segmental wall motion abnormalities. Approximate EF 50-55%. Aortic valve:  - No change post bypass in mitral valve function. Trivial MR remains.  Mitral valve:  - No change post bypass in aortic valve function. Trivial AI remains.  Ascending Aorta:  - The aorta was mildly atherosclerotic.  ------------------------------------------------------------------- Prepared and Electronically Authenticated by  Jairo Ben, MD 2019-04-22T16:08:30             Risk Assessment/Calculations:    CHA2DS2-VASc Score = 5  This indicates a 7.2% annual risk of stroke. The patient's score is based upon: CHF History: 1 HTN History: 1 Diabetes History: 0 Stroke History: 0 Vascular Disease History: 1 Age Score: 2 Gender Score: 0             Physical Exam:   VS:  BP 138/62   Pulse 60   Ht 5\' 8"  (1.727 m)   Wt 148 lb 6.4 oz (67.3 kg)   SpO2 97%   BMI 22.56 kg/m    Wt Readings from Last 3 Encounters:  07/09/22 148 lb 6.4 oz (67.3 kg)  10/04/20 147 lb 3.2 oz (66.8 kg)  08/19/19 148 lb 9.6 oz (67.4 kg)     GEN: Well nourished, well developed in no acute distress NECK: No JVD; +right carotid bruit CARDIAC: RRR, no murmurs, rubs, gallops RESPIRATORY:  Clear to  auscultation without rales, wheezing or rhonchi  ABDOMEN: Soft, non-tender, non-distended EXTREMITIES:  No edema; No deformity   ASSESSMENT AND PLAN:   #CAD s/p CABG: -Currently doing well with no anginal symptoms -Continue lipitor 20mg  dialy -Not on ASA due to need for AC -Continue coreg 6.25mg  BID  #Afib: -Continue coreg 6.25mg  BID -Continue apixaban 5mg  BID  #Suspected Chronic Diastolic HF: -Change lasix to 20mg  daily prn -Low Na diet  #  SSS s/p PPM Placement: -S/p PPM placement -Will re-refer to EP; last saw Dr. Ladona Ridgel in 2021  #HTN: -Continue amlodipine 5mg  daily -Continue coreg 6.25mg  BID -Well controlled 120-130s at home  #Carotid Artery Disease: -1-39% on right; 40-59% on the left -Not on ASA due to need for [facility] -Continue lipitor 20mg  daily -Repeat carotids in 10/2022 for monitorign  #HLD: -Continue lipitor 20mg  daily -Lipids well controlled and managed by PCP        Signed, Meriam Sprague, MD

## 2022-07-09 ENCOUNTER — Ambulatory Visit: Payer: Medicare HMO | Attending: Cardiology | Admitting: Cardiology

## 2022-07-09 ENCOUNTER — Encounter: Payer: Self-pay | Admitting: Cardiology

## 2022-07-09 VITALS — BP 138/62 | HR 60 | Ht 68.0 in | Wt 148.4 lb

## 2022-07-09 DIAGNOSIS — I2510 Atherosclerotic heart disease of native coronary artery without angina pectoris: Secondary | ICD-10-CM | POA: Diagnosis not present

## 2022-07-09 DIAGNOSIS — I6523 Occlusion and stenosis of bilateral carotid arteries: Secondary | ICD-10-CM

## 2022-07-09 DIAGNOSIS — I495 Sick sinus syndrome: Secondary | ICD-10-CM

## 2022-07-09 DIAGNOSIS — E7800 Pure hypercholesterolemia, unspecified: Secondary | ICD-10-CM

## 2022-07-09 DIAGNOSIS — I483 Typical atrial flutter: Secondary | ICD-10-CM

## 2022-07-09 DIAGNOSIS — I4510 Unspecified right bundle-branch block: Secondary | ICD-10-CM

## 2022-07-09 MED ORDER — FUROSEMIDE 20 MG PO TABS: 20.0000 mg | ORAL_TABLET | Freq: Every day | ORAL | 6 refills | Status: AC | PRN

## 2022-07-09 NOTE — Patient Instructions (Signed)
Medication Instructions:   DECREASE YOUR LASIX TO 20 MG BY MOUTH DAILY AS NEEDED FOR SWELLING/WEIGHT GAIN  *If you need a refill on your cardiac medications before your next appointment, please call your pharmacy*    Testing/Procedures:  Your physician has requested that you have a carotid duplex. This test is an ultrasound of the carotid arteries in your neck. It looks at blood flow through these arteries that supply the brain with blood. Allow one hour for this exam. There are no restrictions or special instructions.  CAROTIDS IN SEPTEMBER 2024 PER DR PEMBERTON    Follow-Up:  1.) WITH DR. Ladona Ridgel EP AT NEXT AVAILABLE APPOINTMENT PER DR. Cassandria Santee HAS A PACEMAKER AND HASN'T PHYSICALLY SEEN DR. Ladona Ridgel SINCE 2021--HIS DEVICE HAS BEEN REMOTELY CHECKED BY OUR DEVICE TEAM REGULARLY    2.) At [facility], you and your health needs are our priority.  As part of our continuing mission to provide you with exceptional heart care, we have created designated Provider Care Teams.  These Care Teams include your primary Cardiologist (physician) and Advanced Practice Providers (APPs -  Physician Assistants and Nurse Practitioners) who all work together to provide you with the care you need, when you need it.  We recommend signing up for the patient portal called "MyChart".  Sign up information is provided on this After Visit Summary.  MyChart is used to connect with patients for Virtual Visits (Telemedicine).  Patients are able to view lab/test results, encounter notes, upcoming appointments, etc.  Non-urgent messages can be sent to your provider as well.   To learn more about what you can do with MyChart, go to [email or web address].    Your next appointment:   1 year(s)  Provider:   DR. MARK SKAINS

## 2022-07-29 ENCOUNTER — Encounter (HOSPITAL_COMMUNITY): Payer: Medicare HMO

## 2022-08-13 ENCOUNTER — Encounter: Payer: Self-pay | Admitting: Internal Medicine

## 2022-08-13 ENCOUNTER — Ambulatory Visit: Payer: Medicare HMO | Attending: Internal Medicine | Admitting: Internal Medicine

## 2022-08-13 ENCOUNTER — Ambulatory Visit (INDEPENDENT_AMBULATORY_CARE_PROVIDER_SITE_OTHER): Payer: Medicare HMO

## 2022-08-13 VITALS — BP 148/78 | HR 98 | Ht 68.0 in | Wt 148.0 lb

## 2022-08-13 DIAGNOSIS — I495 Sick sinus syndrome: Secondary | ICD-10-CM

## 2022-08-13 LAB — CUP PACEART REMOTE DEVICE CHECK
Battery Remaining Longevity: 83 mo
Battery Remaining Percentage: 74 %
Battery Voltage: 3.01 V
Brady Statistic AP VP Percent: 2.7 %
Brady Statistic AP VS Percent: 36 %
Brady Statistic AS VP Percent: 1.9 %
Brady Statistic AS VS Percent: 56 %
Brady Statistic RA Percent Paced: 35 %
Brady Statistic RV Percent Paced: 4.5 %
Date Time Interrogation Session: 20240702020014
Implantable Lead Connection Status: 753985
Implantable Lead Connection Status: 753985
Implantable Lead Implant Date: 20210405
Implantable Lead Implant Date: 20210405
Implantable Lead Location: 753859
Implantable Lead Location: 753860
Implantable Pulse Generator Implant Date: 20210405
Lead Channel Impedance Value: 400 Ohm
Lead Channel Impedance Value: 440 Ohm
Lead Channel Pacing Threshold Amplitude: 0.75 V
Lead Channel Pacing Threshold Amplitude: 0.75 V
Lead Channel Pacing Threshold Pulse Width: 0.5 ms
Lead Channel Pacing Threshold Pulse Width: 0.5 ms
Lead Channel Sensing Intrinsic Amplitude: 12 mV
Lead Channel Sensing Intrinsic Amplitude: 2.4 mV
Lead Channel Setting Pacing Amplitude: 2 V
Lead Channel Setting Pacing Amplitude: 2.5 V
Lead Channel Setting Pacing Pulse Width: 0.5 ms
Lead Channel Setting Sensing Sensitivity: 2 mV
Pulse Gen Model: 2272
Pulse Gen Serial Number: 3815020

## 2022-08-13 NOTE — Patient Instructions (Signed)
Medication Instructions:  Your physician recommends that you continue on your current medications as directed. Please refer to the Current Medication list given to you today.  *If you need a refill on your cardiac medications before your next appointment, please call your pharmacy*  Follow-Up: At  [facility], you and your health needs are our priority.  As part of our continuing mission to provide you with exceptional heart care, we have created designated Provider Care Teams.  These Care Teams include your primary Cardiologist (physician) and Advanced Practice Providers (APPs -  Physician Assistants and Nurse Practitioners) who all work together to provide you with the care you need, when you need it.  Your next appointment:   1 year(s)  Provider:   You may see Gregg Taylor, MD or one of the following Advanced Practice Providers on your designated Care Team:   Renee Ursuy, PA-C Michael "Andy" Tillery, PA-C Suzann Riddle, NP Brandi Ollis, NP    

## 2022-08-13 NOTE — Progress Notes (Signed)
HPI Mr. Demmon returns today for followup. He is a pleasant 87 yo man with atrial flutter who went ablation and PPM insertion due to sinus node dysfunction. He has done well in the interim with no chest pain or sob. He has CAD and is s/p CABG and can walk on flat ground without limit. No syncope.  Allergies  Allergen Reactions   Lisinopril Cough     Current Outpatient Medications  Medication Sig Dispense Refill   amLODipine (NORVASC) 5 MG tablet 5 mg daily.     atorvastatin (LIPITOR) 20 MG tablet Take 20 mg by mouth daily.     carvedilol (COREG) 6.25 MG tablet TAKE 1 TABLET TWICE DAILY 60 tablet 0   ELIQUIS 5 MG TABS tablet TAKE 1 TABLET TWICE DAILY 180 tablet 1   furosemide (LASIX) 20 MG tablet Take 1 tablet (20 mg total) by mouth daily as needed (for swelling/weight gain). 30 tablet 6   levothyroxine (SYNTHROID, LEVOTHROID) 112 MCG tablet Take 112 mcg by mouth daily before breakfast.     pantoprazole (PROTONIX) 40 MG tablet Take 40 mg by mouth 2 (two) times daily.      No current facility-administered medications for this visit.     Past Medical History:  Diagnosis Date   Carotid artery disease (HCC)    Colon polyp    small   Coronary artery disease    Coronary artery disease involving native coronary artery of native heart with unstable angina pectoris (HCC) 05/30/2017   3V CABG 06/02/17   HTN (hypertension)    Hx of CABG    Hypothyroidism    Syncope    Syncope    Thrombocytopenia (HCC)     ROS:   All systems reviewed and negative except as noted in the HPI.   Past Surgical History:  Procedure Laterality Date   A-FLUTTER ABLATION N/A 01/15/2019   Procedure: A-FLUTTER ABLATION;  Surgeon: Duke Salvia, MD;  Location: [facility] INVASIVE CV LAB;  Service: Cardiovascular;  Laterality: N/A;   BALLOON DILATION N/A 02/13/2018   Procedure: BALLOON DILATION;  Surgeon: Kerin Salen, MD;  Location: [facility] ENDOSCOPY;  Service: Gastroenterology;  Laterality: N/A;   BIOPSY  02/13/2018    Procedure: BIOPSY;  Surgeon: Kerin Salen, MD;  Location: [facility] ENDOSCOPY;  Service: Gastroenterology;;   CARDIOVERSION N/A 12/21/2018   Procedure: CARDIOVERSION;  Surgeon: Chrystie Nose, MD;  Location: [facility] ENDOSCOPY;  Service: Cardiovascular;  Laterality: N/A;   CATARACT EXTRACTION Left    CORONARY ARTERY BYPASS GRAFT N/A 06/02/2017   Procedure: CORONARY ARTERY BYPASS GRAFTING (CABG) x3 Using Left Internal Mammary Artery and Right Leg Greater Saphenous Vein harvested endoscopically;  Surgeon: Kerin Perna, MD;  Location: [facility] OR;  Service: Open Heart Surgery;  Laterality: N/A;   ESOPHAGOGASTRODUODENOSCOPY N/A 10/23/2014   Procedure: ESOPHAGOGASTRODUODENOSCOPY (EGD);  Surgeon: Bernette Redbird, MD;  Location: [facility] ENDOSCOPY;  Service: Endoscopy;  Laterality: N/A;   ESOPHAGOGASTRODUODENOSCOPY (EGD) WITH PROPOFOL N/A 02/13/2018   Procedure: ESOPHAGOGASTRODUODENOSCOPY (EGD) WITH PROPOFOL;  Surgeon: Kerin Salen, MD;  Location: [facility] ENDOSCOPY;  Service: Gastroenterology;  Laterality: N/A;   FOREIGN BODY REMOVAL  02/13/2018   Procedure: FOREIGN BODY REMOVAL;  Surgeon: Kerin Salen, MD;  Location: [facility] ENDOSCOPY;  Service: Gastroenterology;;   GAS/FLUID EXCHANGE Right 10/24/2017   Procedure: GAS/FLUID EXCHANGE;  Surgeon: Carmela Rima, MD;  Location: [facility] OR;  Service: Ophthalmology;  Laterality: Right;  SF6   HEMORRHOID SURGERY     LEFT HEART CATH AND CORONARY ANGIOGRAPHY N/A 05/30/2017  Procedure: LEFT HEART CATH AND CORONARY ANGIOGRAPHY;  Surgeon: [name], MD;  Location: [facility] INVASIVE CV LAB;  Service: Cardiovascular;  Laterality: N/A;   PACEMAKER IMPLANT N/A 05/17/2019   Procedure: PACEMAKER IMPLANT;  Surgeon: Marinus Maw, MD;  Location: [facility] INVASIVE CV LAB;  Service: Cardiovascular;  Laterality: N/A;   PARS PLANA VITRECTOMY Right 10/24/2017   Procedure: PARS PLANA VITRECTOMY WITH 25 GAUGE;  Surgeon: Carmela Rima, MD;  Location: [facility] OR;  Service: Ophthalmology;  Laterality: Right;   PHOTOCOAGULATION  Right 10/24/2017   Procedure: PHOTOCOAGULATION;  Surgeon: Carmela Rima, MD;  Location: [facility] OR;  Service: Ophthalmology;  Laterality: Right;   REFRACTIVE SURGERY Right    TEE WITHOUT CARDIOVERSION N/A 06/02/2017   Procedure: TRANSESOPHAGEAL ECHOCARDIOGRAM (TEE);  Surgeon: Donata Clay, Theron Arista, MD;  Location: [facility] OR;  Service: Open Heart Surgery;  Laterality: N/A;     Family History  Problem Relation Age of Onset   Pancreatic cancer Mother    Healthy Brother      Social History   Socioeconomic History   Marital status: Married    Spouse name: Not on file   Number of children: Not on file   Years of education: Not on file   Highest education level: Not on file  Occupational History   Occupation: retired  Tobacco Use   Smoking status: Never   Smokeless tobacco: Never  Vaping Use   Vaping Use: Never used  Substance and Sexual Activity   Alcohol use: Yes    Alcohol/week: 4.0 standard drinks of alcohol    Types: 4 Cans of beer per week   Drug use: No   Sexual activity: Not on file  Other Topics Concern   Not on file  Social History Narrative   Not on file   Social Determinants of Health   Financial Resource Strain: Not on file  Food Insecurity: Not on file  Transportation Needs: Not on file  Physical Activity: Not on file  Stress: Not on file  Social Connections: Not on file  Intimate Partner Violence: Not on file     BP (!) 148/78   Pulse 98   Ht 5\' 8"  (1.727 m)   Wt 148 lb (67.1 kg)   SpO2 98%   BMI 22.50 kg/m   Physical Exam:  Well appearing NAD HEENT: Unremarkable Neck:  No JVD, no thyromegally Lymphatics:  No adenopathy Back:  No CVA tenderness Lungs:  Clear HEART:  Regular rate rhythm, no murmurs, no rubs, no clicks Abd:  soft, positive bowel sounds, no organomegally, no rebound, no guarding Ext:  2 plus pulses, no edema, no cyanosis, no clubbing Skin:  No rashes no nodules Neuro:  CN II through XII intact, motor grossly intact  EKG  DEVICE   Normal device function.  See PaceArt for details.   Assess/Plan:  1. Atrial fib/flutter  - he is maintaining NSR 97% of the time. He will continue his current meds. 2. Sinus node dysfunction - he is asymptomatic, s/p PPM insertion. 3. PPM - his [employer]. [employer] DDD PM is working normally. No change. 4. CAD - he denies anginal symptoms. No change in his meds. I encouraged him to walk more.   Leonia Reeves.D.

## 2022-08-14 LAB — CUP PACEART INCLINIC DEVICE CHECK
Battery Remaining Longevity: 91 mo
Battery Voltage: 3.01 V
Brady Statistic RA Percent Paced: 35 %
Brady Statistic RV Percent Paced: 4.5 %
Date Time Interrogation Session: 20240702135200
Implantable Lead Connection Status: 753985
Implantable Lead Connection Status: 753985
Implantable Lead Implant Date: 20210405
Implantable Lead Implant Date: 20210405
Implantable Lead Location: 753859
Implantable Lead Location: 753860
Implantable Pulse Generator Implant Date: 20210405
Lead Channel Impedance Value: 400 Ohm
Lead Channel Impedance Value: 437.5 Ohm
Lead Channel Pacing Threshold Amplitude: 0.75 V
Lead Channel Pacing Threshold Amplitude: 0.75 V
Lead Channel Pacing Threshold Amplitude: 1 V
Lead Channel Pacing Threshold Amplitude: 1 V
Lead Channel Pacing Threshold Pulse Width: 0.5 ms
Lead Channel Pacing Threshold Pulse Width: 0.5 ms
Lead Channel Pacing Threshold Pulse Width: 0.5 ms
Lead Channel Pacing Threshold Pulse Width: 0.5 ms
Lead Channel Sensing Intrinsic Amplitude: 12 mV
Lead Channel Sensing Intrinsic Amplitude: 2.3 mV
Lead Channel Setting Pacing Amplitude: 2 V
Lead Channel Setting Pacing Amplitude: 2.5 V
Lead Channel Setting Pacing Pulse Width: 0.5 ms
Lead Channel Setting Sensing Sensitivity: 2 mV
Pulse Gen Model: 2272
Pulse Gen Serial Number: 3815020

## 2022-09-06 NOTE — Progress Notes (Signed)
Remote pacemaker transmission.   

## 2022-10-21 ENCOUNTER — Ambulatory Visit (HOSPITAL_COMMUNITY)
Admission: RE | Admit: 2022-10-21 | Discharge: 2022-10-21 | Disposition: A | Discharge status: Home / Self Care | Payer: Medicare HMO | Source: Ambulatory Visit | Attending: Cardiology | Admitting: Cardiology

## 2022-10-21 DIAGNOSIS — I6523 Occlusion and stenosis of bilateral carotid arteries: Secondary | ICD-10-CM | POA: Diagnosis not present

## 2022-10-28 DIAGNOSIS — Z1331 Encounter for screening for depression: Secondary | ICD-10-CM | POA: Diagnosis not present

## 2022-10-28 DIAGNOSIS — Z0000 Encounter for general adult medical examination without abnormal findings: Secondary | ICD-10-CM | POA: Diagnosis not present

## 2022-10-28 DIAGNOSIS — D696 Thrombocytopenia, unspecified: Secondary | ICD-10-CM | POA: Diagnosis not present

## 2022-10-28 DIAGNOSIS — Z23 Encounter for immunization: Secondary | ICD-10-CM | POA: Diagnosis not present

## 2022-10-28 DIAGNOSIS — N1831 Chronic kidney disease, stage 3a: Secondary | ICD-10-CM | POA: Diagnosis not present

## 2022-10-28 DIAGNOSIS — I10 Essential (primary) hypertension: Secondary | ICD-10-CM | POA: Diagnosis not present

## 2022-10-28 DIAGNOSIS — E7800 Pure hypercholesterolemia, unspecified: Secondary | ICD-10-CM | POA: Diagnosis not present

## 2022-10-28 DIAGNOSIS — I4892 Unspecified atrial flutter: Secondary | ICD-10-CM | POA: Diagnosis not present

## 2022-10-28 DIAGNOSIS — R7303 Prediabetes: Secondary | ICD-10-CM | POA: Diagnosis not present

## 2022-10-28 DIAGNOSIS — I5032 Chronic diastolic (congestive) heart failure: Secondary | ICD-10-CM | POA: Diagnosis not present

## 2022-10-28 DIAGNOSIS — D6869 Other thrombophilia: Secondary | ICD-10-CM | POA: Diagnosis not present

## 2022-10-28 DIAGNOSIS — I700 Atherosclerosis of aorta: Secondary | ICD-10-CM | POA: Diagnosis not present

## 2022-10-28 DIAGNOSIS — E039 Hypothyroidism, unspecified: Secondary | ICD-10-CM | POA: Diagnosis not present

## 2022-10-30 ENCOUNTER — Encounter: Payer: Self-pay | Admitting: *Deleted

## 2022-11-11 DIAGNOSIS — H35372 Puckering of macula, left eye: Secondary | ICD-10-CM | POA: Diagnosis not present

## 2022-11-11 DIAGNOSIS — H02401 Unspecified ptosis of right eyelid: Secondary | ICD-10-CM | POA: Diagnosis not present

## 2022-11-11 DIAGNOSIS — H43812 Vitreous degeneration, left eye: Secondary | ICD-10-CM | POA: Diagnosis not present

## 2022-11-11 DIAGNOSIS — H524 Presbyopia: Secondary | ICD-10-CM | POA: Diagnosis not present

## 2022-11-11 DIAGNOSIS — H52203 Unspecified astigmatism, bilateral: Secondary | ICD-10-CM | POA: Diagnosis not present

## 2022-11-12 ENCOUNTER — Ambulatory Visit (INDEPENDENT_AMBULATORY_CARE_PROVIDER_SITE_OTHER): Payer: Medicare HMO

## 2022-11-12 DIAGNOSIS — I495 Sick sinus syndrome: Secondary | ICD-10-CM

## 2022-11-12 LAB — CUP PACEART REMOTE DEVICE CHECK
Battery Remaining Longevity: 80 mo
Battery Remaining Percentage: 72 %
Battery Voltage: 3.01 V
Brady Statistic AP VP Percent: 1.5 %
Brady Statistic AP VS Percent: 48 %
Brady Statistic AS VP Percent: 1 %
Brady Statistic AS VS Percent: 48 %
Brady Statistic RA Percent Paced: 47 %
Brady Statistic RV Percent Paced: 2.4 %
Date Time Interrogation Session: 20241001020016
Implantable Lead Connection Status: 753985
Implantable Lead Connection Status: 753985
Implantable Lead Implant Date: 20210405
Implantable Lead Implant Date: 20210405
Implantable Lead Location: 753859
Implantable Lead Location: 753860
Implantable Pulse Generator Implant Date: 20210405
Lead Channel Impedance Value: 400 Ohm
Lead Channel Impedance Value: 440 Ohm
Lead Channel Pacing Threshold Amplitude: 0.75 V
Lead Channel Pacing Threshold Amplitude: 1 V
Lead Channel Pacing Threshold Pulse Width: 0.5 ms
Lead Channel Pacing Threshold Pulse Width: 0.5 ms
Lead Channel Sensing Intrinsic Amplitude: 12 mV
Lead Channel Sensing Intrinsic Amplitude: 2.3 mV
Lead Channel Setting Pacing Amplitude: 2 V
Lead Channel Setting Pacing Amplitude: 2.5 V
Lead Channel Setting Pacing Pulse Width: 0.5 ms
Lead Channel Setting Sensing Sensitivity: 2 mV
Pulse Gen Model: 2272
Pulse Gen Serial Number: 3815020

## 2022-11-27 NOTE — Progress Notes (Signed)
Remote pacemaker transmission.   

## 2023-02-11 ENCOUNTER — Ambulatory Visit (INDEPENDENT_AMBULATORY_CARE_PROVIDER_SITE_OTHER): Payer: Medicare HMO

## 2023-02-11 DIAGNOSIS — I495 Sick sinus syndrome: Secondary | ICD-10-CM | POA: Diagnosis not present

## 2023-02-11 LAB — CUP PACEART REMOTE DEVICE CHECK
Battery Remaining Longevity: 78 mo
Battery Remaining Percentage: 70 %
Battery Voltage: 3.01 V
Brady Statistic AP VP Percent: 1.2 %
Brady Statistic AP VS Percent: 46 %
Brady Statistic AS VP Percent: 1 %
Brady Statistic AS VS Percent: 51 %
Brady Statistic RA Percent Paced: 45 %
Brady Statistic RV Percent Paced: 1.9 %
Date Time Interrogation Session: 20241231020013
Implantable Lead Connection Status: 753985
Implantable Lead Connection Status: 753985
Implantable Lead Implant Date: 20210405
Implantable Lead Implant Date: 20210405
Implantable Lead Location: 753859
Implantable Lead Location: 753860
Implantable Pulse Generator Implant Date: 20210405
Lead Channel Impedance Value: 400 Ohm
Lead Channel Impedance Value: 440 Ohm
Lead Channel Pacing Threshold Amplitude: 0.75 V
Lead Channel Pacing Threshold Amplitude: 1 V
Lead Channel Pacing Threshold Pulse Width: 0.5 ms
Lead Channel Pacing Threshold Pulse Width: 0.5 ms
Lead Channel Sensing Intrinsic Amplitude: 12 mV
Lead Channel Sensing Intrinsic Amplitude: 2.1 mV
Lead Channel Setting Pacing Amplitude: 2 V
Lead Channel Setting Pacing Amplitude: 2.5 V
Lead Channel Setting Pacing Pulse Width: 0.5 ms
Lead Channel Setting Sensing Sensitivity: 2 mV
Pulse Gen Model: 2272
Pulse Gen Serial Number: 3815020

## 2023-02-14 DIAGNOSIS — Z03818 Encounter for observation for suspected exposure to other biological agents ruled out: Secondary | ICD-10-CM | POA: Diagnosis not present

## 2023-02-14 DIAGNOSIS — J069 Acute upper respiratory infection, unspecified: Secondary | ICD-10-CM | POA: Diagnosis not present

## 2023-03-25 NOTE — Progress Notes (Signed)
Remote pacemaker transmission.

## 2023-04-29 DIAGNOSIS — K2270 Barrett's esophagus without dysplasia: Secondary | ICD-10-CM | POA: Diagnosis not present

## 2023-04-29 DIAGNOSIS — I5032 Chronic diastolic (congestive) heart failure: Secondary | ICD-10-CM | POA: Diagnosis not present

## 2023-04-29 DIAGNOSIS — I4892 Unspecified atrial flutter: Secondary | ICD-10-CM | POA: Diagnosis not present

## 2023-04-29 DIAGNOSIS — I25810 Atherosclerosis of coronary artery bypass graft(s) without angina pectoris: Secondary | ICD-10-CM | POA: Diagnosis not present

## 2023-04-29 DIAGNOSIS — I700 Atherosclerosis of aorta: Secondary | ICD-10-CM | POA: Diagnosis not present

## 2023-04-29 DIAGNOSIS — I495 Sick sinus syndrome: Secondary | ICD-10-CM | POA: Diagnosis not present

## 2023-04-29 DIAGNOSIS — N1831 Chronic kidney disease, stage 3a: Secondary | ICD-10-CM | POA: Diagnosis not present

## 2023-04-29 DIAGNOSIS — D696 Thrombocytopenia, unspecified: Secondary | ICD-10-CM | POA: Diagnosis not present

## 2023-04-29 DIAGNOSIS — I10 Essential (primary) hypertension: Secondary | ICD-10-CM | POA: Diagnosis not present

## 2023-05-05 DIAGNOSIS — H6123 Impacted cerumen, bilateral: Secondary | ICD-10-CM | POA: Diagnosis not present

## 2023-05-07 DIAGNOSIS — K2270 Barrett's esophagus without dysplasia: Secondary | ICD-10-CM | POA: Diagnosis not present

## 2023-05-07 DIAGNOSIS — I4892 Unspecified atrial flutter: Secondary | ICD-10-CM | POA: Diagnosis not present

## 2023-05-07 DIAGNOSIS — Z950 Presence of cardiac pacemaker: Secondary | ICD-10-CM | POA: Diagnosis not present

## 2023-05-08 ENCOUNTER — Telehealth: Payer: Self-pay | Admitting: *Deleted

## 2023-05-08 DIAGNOSIS — I483 Typical atrial flutter: Secondary | ICD-10-CM

## 2023-05-08 NOTE — Telephone Encounter (Signed)
 Patient with diagnosis of Afib on Eliquis for anticoagulation.    Procedure: ENDOSCOPY Date of procedure: 06/03/2023   CHA2DS2-VASc Score = 5   This indicates a 7.2% annual risk of stroke. The patient's score is based upon: CHF History: 1 HTN History: 1 Diabetes History: 0 Stroke History: 0 Vascular Disease History: 1 Age Score: 2 Gender Score: 0  CrCl and Platelet : need updated lab (last lab from 09/2020 in [facility] and Epic)    Per office protocol, patient can hold Eliquis for -- days prior to procedure.     **This guidance is not considered finalized until pre-operative APP has relayed final recommendations.**

## 2023-05-08 NOTE — Telephone Encounter (Signed)
   Pre-operative Risk Assessment    Patient Name: Wayne Mitchell  DOB: 05-08-1935 MRN: [id number]   Date of last office visit: 08/2022 Date of next office visit: NONE   Request for Surgical Clearance    Procedure:   ENDOSCOPY  Date of Surgery:  Clearance 06/03/23                                Surgeon:  DR. Matthias Hughs Surgeon's Group or Practice Name:  [facility] Phone number:  [phone] Fax number:  [phone]   Type of Clearance Requested:   - Medical  - Pharmacy:  Hold Apixaban (Eliquis) NOT INDICATED   Type of Anesthesia:  Not Indicated   Additional requests/questions:    Wilhemina Cash   05/08/2023, 7:21 AM

## 2023-05-08 NOTE — Telephone Encounter (Signed)
 I left a message for the patient to call our office back.

## 2023-05-08 NOTE — Telephone Encounter (Signed)
Patient returned Pre-op call. 

## 2023-05-08 NOTE — Telephone Encounter (Signed)
 Spoke with patient and informed him that labs have been ordered. Patient verbalized understanding and thanked me for the call.

## 2023-05-09 DIAGNOSIS — I483 Typical atrial flutter: Secondary | ICD-10-CM | POA: Diagnosis not present

## 2023-05-10 LAB — CBC
Hematocrit: 37.5 % (ref 37.5–51.0)
Hemoglobin: 12.4 g/dL — ABNORMAL LOW (ref 13.0–17.7)
MCH: 28.1 pg (ref 26.6–33.0)
MCHC: 33.1 g/dL (ref 31.5–35.7)
MCV: 85 fL (ref 79–97)
Platelets: 87 10*3/uL — CL (ref 150–450)
RBC: 4.41 x10E6/uL (ref 4.14–5.80)
RDW: 14 % (ref 11.6–15.4)
WBC: 7.7 10*3/uL (ref 3.4–10.8)

## 2023-05-10 LAB — BASIC METABOLIC PANEL WITH GFR
BUN/Creatinine Ratio: 10 (ref 10–24)
BUN: 12 mg/dL (ref 8–27)
CO2: 23 mmol/L (ref 20–29)
Calcium: 9.4 mg/dL (ref 8.6–10.2)
Chloride: 100 mmol/L (ref 96–106)
Creatinine, Ser: 1.15 mg/dL (ref 0.76–1.27)
Glucose: 114 mg/dL — ABNORMAL HIGH (ref 70–99)
Potassium: 4.7 mmol/L (ref 3.5–5.2)
Sodium: 139 mmol/L (ref 134–144)
eGFR: 62 mL/min/{1.73_m2} (ref >59)

## 2023-05-12 ENCOUNTER — Telehealth: Payer: Self-pay

## 2023-05-12 NOTE — Telephone Encounter (Signed)
 Patient with diagnosis of Afib on Eliquis for anticoagulation.     Procedure: ENDOSCOPY Date of procedure: 06/03/2023     CHA2DS2-VASc Score = 5   This indicates a 7.2% annual risk of stroke. The patient's score is based upon: CHF History: 1 HTN History: 1 Diabetes History: 0 Stroke History: 0 Vascular Disease History: 1 Age Score: 2 Gender Score: 0   CrCl 44 ml/min Platelet : 87     Per office protocol, patient can hold Eliquis for 2 days prior to procedure.

## 2023-05-12 NOTE — Telephone Encounter (Signed)
 Spoke with patient who is agreeable to do a tele visit on 4/14 at 9:40 am. Med rec and consent done.

## 2023-05-12 NOTE — Telephone Encounter (Signed)
  Patient Consent for Virtual Visit        Wayne Mitchell has provided verbal consent on 05/12/2023 for a virtual visit (video or telephone).   CONSENT FOR VIRTUAL VISIT FOR:  Wayne Mitchell  By participating in this virtual visit I agree to the following:  I hereby voluntarily request, consent and authorize [facility] and its employed or contracted physicians, physician assistants, nurse practitioners or other licensed health care professionals (the Practitioner), to provide me with telemedicine health care services (the "Services") as deemed necessary by the treating Practitioner. I acknowledge and consent to receive the Services by the Practitioner via telemedicine. I understand that the telemedicine visit will involve communicating with the Practitioner through live audiovisual communication technology and the disclosure of certain medical information by electronic transmission. I acknowledge that I have been given the opportunity to request an in-person assessment or other available alternative prior to the telemedicine visit and am voluntarily participating in the telemedicine visit.  I understand that I have the right to withhold or withdraw my consent to the use of telemedicine in the course of my care at any time, without affecting my right to future care or treatment, and that the Practitioner or I may terminate the telemedicine visit at any time. I understand that I have the right to inspect all information obtained and/or recorded in the course of the telemedicine visit and may receive copies of available information for a reasonable fee.  I understand that some of the potential risks of receiving the Services via telemedicine include:  Delay or interruption in medical evaluation due to technological equipment failure or disruption; Information transmitted may not be sufficient (e.g. poor resolution of images) to allow for appropriate medical decision making by the Practitioner;  and/or  In rare instances, security protocols could fail, causing a breach of personal health information.  Furthermore, I acknowledge that it is my responsibility to provide information about my medical history, conditions and care that is complete and accurate to the best of my ability. I acknowledge that Practitioner's advice, recommendations, and/or decision may be based on factors not within their control, such as incomplete or inaccurate data provided by me or distortions of diagnostic images or specimens that may result from electronic transmissions. I understand that the practice of medicine is not an exact science and that Practitioner makes no warranties or guarantees regarding treatment outcomes. I acknowledge that a copy of this consent can be made available to me via my patient portal [facility] MyChart), or I can request a printed copy by calling the office of [facility].    I understand that my insurance will be billed for this visit.   I have read or had this consent read to me. I understand the contents of this consent, which adequately explains the benefits and risks of the Services being provided via telemedicine.  I have been provided ample opportunity to ask questions regarding this consent and the Services and have had my questions answered to my satisfaction. I give my informed consent for the services to be provided through the use of telemedicine in my medical care

## 2023-05-12 NOTE — Telephone Encounter (Addendum)
   Name: Wayne Mitchell  DOB: 02/07/1936  MRN: [id number]  Primary Cardiologist: Lesleigh Noe, MD (Inactive)  Chart reviewed as part of pre-operative protocol coverage. Because of CASIMIR BARCELLOS past medical history and time since last visit, he will require a follow-up telephone visit in order to better assess preoperative cardiovascular risk.  Pre-op covering staff: - Please schedule appointment and call patient to inform them. If patient already had an upcoming appointment within acceptable timeframe, please add "pre-op clearance" to the appointment notes so provider is aware. - Please contact requesting surgeon's office via preferred method (i.e, phone, fax) to inform them of need for appointment prior to surgery.  Per office protocol, patient can hold Eliquis for 2 days prior to procedure. Please resume when medically safe to do so.  I also confirmed the patient resides in the state of [place]. As per [facility] Medical Board telemedicine laws, the patient must reside in the state in which the provider is licensed    Sharlene Dory, PA-C  05/12/2023, 11:21 AM

## 2023-05-12 NOTE — Telephone Encounter (Signed)
I left a message for the patient to call our office to schedule a tele visit for pre-op clearance.  

## 2023-05-12 NOTE — Telephone Encounter (Signed)
 Patient is returning call. Please advise?

## 2023-05-13 ENCOUNTER — Ambulatory Visit (INDEPENDENT_AMBULATORY_CARE_PROVIDER_SITE_OTHER): Payer: Medicare HMO

## 2023-05-13 DIAGNOSIS — I495 Sick sinus syndrome: Secondary | ICD-10-CM

## 2023-05-13 LAB — CUP PACEART REMOTE DEVICE CHECK
Battery Remaining Longevity: 76 mo
Battery Remaining Percentage: 68 %
Battery Voltage: 3.01 V
Brady Statistic AP VP Percent: 2 %
Brady Statistic AP VS Percent: 43 %
Brady Statistic AS VP Percent: 1 %
Brady Statistic AS VS Percent: 53 %
Brady Statistic RA Percent Paced: 43 %
Brady Statistic RV Percent Paced: 2.7 %
Date Time Interrogation Session: 20250401020015
Implantable Lead Connection Status: 753985
Implantable Lead Connection Status: 753985
Implantable Lead Implant Date: 20210405
Implantable Lead Implant Date: 20210405
Implantable Lead Location: 753859
Implantable Lead Location: 753860
Implantable Pulse Generator Implant Date: 20210405
Lead Channel Impedance Value: 400 Ohm
Lead Channel Impedance Value: 430 Ohm
Lead Channel Pacing Threshold Amplitude: 0.75 V
Lead Channel Pacing Threshold Amplitude: 1 V
Lead Channel Pacing Threshold Pulse Width: 0.5 ms
Lead Channel Pacing Threshold Pulse Width: 0.5 ms
Lead Channel Sensing Intrinsic Amplitude: 12 mV
Lead Channel Sensing Intrinsic Amplitude: 2.1 mV
Lead Channel Setting Pacing Amplitude: 2 V
Lead Channel Setting Pacing Amplitude: 2.5 V
Lead Channel Setting Pacing Pulse Width: 0.5 ms
Lead Channel Setting Sensing Sensitivity: 2 mV
Pulse Gen Model: 2272
Pulse Gen Serial Number: 3815020

## 2023-05-18 ENCOUNTER — Encounter: Payer: Self-pay | Admitting: Internal Medicine

## 2023-05-26 ENCOUNTER — Ambulatory Visit: Attending: Internal Medicine | Admitting: Emergency Medicine

## 2023-05-26 ENCOUNTER — Encounter: Payer: Self-pay | Admitting: Internal Medicine

## 2023-05-26 DIAGNOSIS — Z01810 Encounter for preprocedural cardiovascular examination: Secondary | ICD-10-CM | POA: Diagnosis not present

## 2023-05-26 NOTE — Progress Notes (Signed)
 Virtual Visit via Telephone Note   Because of Wayne Mitchell co-morbid illnesses, he is at least at moderate risk for complications without adequate follow up.  This format is felt to be most appropriate for this patient at this time.  Due to technical limitations with video connection Web designer), today's appointment will be conducted as an audio only telehealth visit, and EMILIAN STAWICKI verbally agreed to proceed in this manner.   All issues noted in this document were discussed and addressed.  No physical exam could be performed with this format.  Evaluation Performed:  Preoperative cardiovascular risk assessment _____________   Date:  05/26/2023   Patient ID:  Wayne Mitchell, DOB 1936-01-29, MRN [id number] Patient Location:  Home Provider location:   Office  Primary Care Provider:  Georgann Housekeeper, MD Primary Cardiologist:  Lesleigh Noe, MD (Inactive)  Chief Complaint / Patient Profile   88 y.o. y/o male with a h/o atrial fibs/flutter, sinus node dysfunction s/p PPM, coronary artery disease s/p CABG, chronic diastolic heart failure, hypertension, carotid artery disease, hyperlipidemia who is pending endoscopy on 06/03/2023 with [facility] by Dr. Matthias Hughs and presents today for telephonic preoperative cardiovascular risk assessment.  History of Present Illness    Wayne Mitchell is a 88 y.o. male who presents via audio/video conferencing for a telehealth visit today.  Pt was last seen in cardiology clinic on 08/13/2022 by Dr. Ladona Ridgel.  At that time TRAESON DUSZA was doing well.  The patient is now pending procedure as outlined above. Since his last visit, he denies chest pain, shortness of breath, lower extremity edema, fatigue, palpitations, melena, hematuria, hemoptysis, diaphoresis, weakness, presyncope, syncope, orthopnea, and PND.  Patient denies acute cardiovascular concerns or complaints today.  He notes he is doing well overall.  He stays relatively active.  He continues to work  in his yard and even played golf last week.  He denies any anginal symptoms.  Past Medical History    Past Medical History:  Diagnosis Date   Carotid artery disease (HCC)    Colon polyp    small   Coronary artery disease    Coronary artery disease involving native coronary artery of native heart with unstable angina pectoris (HCC) 05/30/2017   3V CABG 06/02/17   HTN (hypertension)    Hx of CABG    Hypothyroidism    Syncope    Syncope    Thrombocytopenia (HCC)    Past Surgical History:  Procedure Laterality Date   A-FLUTTER ABLATION N/A 01/15/2019   Procedure: A-FLUTTER ABLATION;  Surgeon: Duke Salvia, MD;  Location: [facility] INVASIVE CV LAB;  Service: Cardiovascular;  Laterality: N/A;   BALLOON DILATION N/A 02/13/2018   Procedure: BALLOON DILATION;  Surgeon: Kerin Salen, MD;  Location: [facility] ENDOSCOPY;  Service: Gastroenterology;  Laterality: N/A;   BIOPSY  02/13/2018   Procedure: BIOPSY;  Surgeon: Kerin Salen, MD;  Location: [facility] ENDOSCOPY;  Service: Gastroenterology;;   CARDIOVERSION N/A 12/21/2018   Procedure: CARDIOVERSION;  Surgeon: Chrystie Nose, MD;  Location: [facility] ENDOSCOPY;  Service: Cardiovascular;  Laterality: N/A;   CATARACT EXTRACTION Left    CORONARY ARTERY BYPASS GRAFT N/A 06/02/2017   Procedure: CORONARY ARTERY BYPASS GRAFTING (CABG) x3 Using Left Internal Mammary Artery and Right Leg Greater Saphenous Vein harvested endoscopically;  Surgeon: Kerin Perna, MD;  Location: [facility] OR;  Service: Open Heart Surgery;  Laterality: N/A;   ESOPHAGOGASTRODUODENOSCOPY N/A 10/23/2014   Procedure: ESOPHAGOGASTRODUODENOSCOPY (EGD);  Surgeon: Bernette Redbird, MD;  Location: [facility]  ENDOSCOPY;  Service: Endoscopy;  Laterality: N/A;   ESOPHAGOGASTRODUODENOSCOPY (EGD) WITH PROPOFOL N/A 02/13/2018   Procedure: ESOPHAGOGASTRODUODENOSCOPY (EGD) WITH PROPOFOL;  Surgeon: Kerin Salen, MD;  Location: [facility] ENDOSCOPY;  Service: Gastroenterology;  Laterality: N/A;   FOREIGN BODY REMOVAL  02/13/2018   Procedure:  FOREIGN BODY REMOVAL;  Surgeon: Kerin Salen, MD;  Location: [facility] ENDOSCOPY;  Service: Gastroenterology;;   GAS/FLUID EXCHANGE Right 10/24/2017   Procedure: GAS/FLUID EXCHANGE;  Surgeon: Carmela Rima, MD;  Location: [facility] OR;  Service: Ophthalmology;  Laterality: Right;  SF6   HEMORRHOID SURGERY     LEFT HEART CATH AND CORONARY ANGIOGRAPHY N/A 05/30/2017   Procedure: LEFT HEART CATH AND CORONARY ANGIOGRAPHY;  Surgeon: [name], MD;  Location: [facility] INVASIVE CV LAB;  Service: Cardiovascular;  Laterality: N/A;   PACEMAKER IMPLANT N/A 05/17/2019   Procedure: PACEMAKER IMPLANT;  Surgeon: Marinus Maw, MD;  Location: [facility] INVASIVE CV LAB;  Service: Cardiovascular;  Laterality: N/A;   PARS PLANA VITRECTOMY Right 10/24/2017   Procedure: PARS PLANA VITRECTOMY WITH 25 GAUGE;  Surgeon: Carmela Rima, MD;  Location: [facility] OR;  Service: Ophthalmology;  Laterality: Right;   PHOTOCOAGULATION Right 10/24/2017   Procedure: PHOTOCOAGULATION;  Surgeon: Carmela Rima, MD;  Location: [facility] OR;  Service: Ophthalmology;  Laterality: Right;   REFRACTIVE SURGERY Right    TEE WITHOUT CARDIOVERSION N/A 06/02/2017   Procedure: TRANSESOPHAGEAL ECHOCARDIOGRAM (TEE);  Surgeon: Donata Clay, Theron Arista, MD;  Location: [facility] OR;  Service: Open Heart Surgery;  Laterality: N/A;    Allergies  Allergies  Allergen Reactions   Lisinopril Cough    Home Medications    Prior to Admission medications   Medication Sig Start Date End Date Taking? Authorizing Provider  amLODipine (NORVASC) 5 MG tablet 5 mg daily. 08/24/20   [provider]  atorvastatin (LIPITOR) 20 MG tablet Take 20 mg by mouth daily.    [provider]  carvedilol (COREG) 6.25 MG tablet TAKE 1 TABLET TWICE DAILY 10/19/21   Marinus Maw, MD  ELIQUIS 5 MG TABS tablet TAKE 1 TABLET TWICE DAILY 08/06/21   Lyn Records, MD  furosemide (LASIX) 20 MG tablet Take 1 tablet (20 mg total) by mouth daily as needed (for swelling/weight gain). 07/09/22   Meriam Sprague, MD  levothyroxine (SYNTHROID, LEVOTHROID) 112 MCG tablet Take 112 mcg by mouth daily before breakfast.    [provider]  pantoprazole (PROTONIX) 40 MG tablet Take 40 mg by mouth 2 (two) times daily.  04/29/18   [provider]    Physical Exam    Vital Signs:  RILYN UPSHAW does not have vital signs available for review today.  Given telephonic nature of communication, physical exam is limited. AAOx3. NAD. Normal affect.  Speech and respirations are unlabored.  Accessory Clinical Findings    None  Assessment & Plan    1.  Preoperative Cardiovascular Risk Assessment: According to the Revised Cardiac Risk Index (RCRI), his Perioperative Risk of Major Cardiac Event is (%): 6.6. His Functional Capacity in METs is: 6.36 according to the [employer] Activity Status Index (DASI). Therefore, based on ACC/AHA guidelines, patient would be at acceptable risk for the planned procedure without further cardiovascular testing.   The patient was advised that if he develops new symptoms prior to surgery to contact our office to arrange for a follow-up visit, and he verbalized understanding.  Per office protocol, patient can hold Eliquis for 2 days prior to procedure   A copy of this note will be routed to  requesting surgeon.  Time:   Today, I have spent 7 minutes with the patient with telehealth technology discussing medical history, symptoms, and management plan.     Ava Boatman, NP  05/26/2023, 7:35 AM

## 2023-05-26 NOTE — Progress Notes (Signed)
 PERIOPERATIVE PRESCRIPTION FOR IMPLANTED CARDIAC DEVICE PROGRAMMING  Patient Information: Name:  Wayne Mitchell  DOB:  18-May-1935  MRN:  [id number]  Procedure:   ENDOSCOPY   Date of Surgery:  Clearance 06/03/23                                  Surgeon:  DR. Dellis Fermo Surgeon's Group or Practice Name:  [facility] Phone number:  [phone] Fax number:  [phone]   Type of Clearance Requested:   - Medical  - Pharmacy:  Hold Apixaban (Eliquis) NOT INDICATED   Type of Anesthesia:  Not Indicated   Device Information:  Clinic EP Physician:  Manya Sells, MD   Device Type:  Pacemaker Manufacturer and Phone #:  St. Jude/[employer]: [phone] Pacemaker Dependent?:  No. Date of Last Device Check:  05/13/2023 Normal Device Function?:  Yes.    Electrophysiologist's Recommendations:  Have magnet available. Provide continuous ECG monitoring when magnet is used or reprogramming is to be performed.  Procedure should not interfere with device function.  No device programming or magnet placement needed.  Per Device Clinic Standing Orders, Forrestine Ike, RN  12:46 PM 05/26/2023

## 2023-06-03 DIAGNOSIS — K2270 Barrett's esophagus without dysplasia: Secondary | ICD-10-CM | POA: Diagnosis not present

## 2023-06-25 NOTE — Progress Notes (Signed)
 Remote pacemaker transmission.

## 2023-06-30 ENCOUNTER — Ambulatory Visit: Attending: Internal Medicine | Admitting: Internal Medicine

## 2023-06-30 ENCOUNTER — Encounter: Payer: Self-pay | Admitting: Internal Medicine

## 2023-06-30 VITALS — BP 164/60 | HR 60 | Ht 68.0 in | Wt 148.0 lb

## 2023-06-30 DIAGNOSIS — I495 Sick sinus syndrome: Secondary | ICD-10-CM

## 2023-06-30 DIAGNOSIS — I10 Essential (primary) hypertension: Secondary | ICD-10-CM | POA: Diagnosis not present

## 2023-06-30 DIAGNOSIS — I483 Typical atrial flutter: Secondary | ICD-10-CM | POA: Diagnosis not present

## 2023-06-30 LAB — CUP PACEART INCLINIC DEVICE CHECK
Brady Statistic RA Percent Paced: 44 %
Brady Statistic RV Percent Paced: 3.9 %
Date Time Interrogation Session: 20250519105709
Implantable Lead Connection Status: 753985
Implantable Lead Connection Status: 753985
Implantable Lead Implant Date: 20210405
Implantable Lead Implant Date: 20210405
Implantable Lead Location: 753859
Implantable Lead Location: 753860
Implantable Pulse Generator Implant Date: 20210405
Pulse Gen Model: 2272
Pulse Gen Serial Number: 3815020

## 2023-06-30 MED ORDER — AMLODIPINE BESYLATE 10 MG PO TABS
10.0000 mg | ORAL_TABLET | Freq: Every day | ORAL | 3 refills | Status: DC
Start: 2023-06-30 — End: 2023-11-11

## 2023-06-30 NOTE — Patient Instructions (Addendum)
 Medication Instructions:  Your physician has recommended you make the following change in your medication:  Increase amlodipine  to 10mg  daily.  Lab Work: None ordered.  You may go to any [facility] Location for your lab work:  [facility] - [address] ([facility]) - [address] [phone] [address]  [place] - [address]   [place]  - [address]   [place] - [address] - [address] [employer]  - [address] [place] - [address] ([facility]   If you have labs (blood work) drawn today and your tests are completely normal, you will receive your results only by: [employer] (if you have MyChart)  If you have any lab test that is abnormal or we need to change your treatment, we will call you or send a MyChart message to review the results.  Testing/Procedures: None ordered.  Follow-Up: At [facility], you and your health needs are our priority.  As part of our continuing mission to provide you with exceptional heart care, we have created designated Provider Care Teams.  These Care Teams include your primary Cardiologist (physician) and Advanced Practice Providers (APPs -  Physician Assistants and Nurse Practitioners) who all work together to provide you with the care you need, when you need it.  Your next appointment:   1 year(s)  The format for your next appointment:   In Person  Provider:   Manya Sells, MD{or one of the following Advanced Practice Providers on your designated Care Team:   Mertha Abrahams, [place] Bambi Lever "Jonelle Neri" [place], [place] Neda Balk, NP  Note: Remote monitoring is used to monitor your Pacemaker/ ICD from home. This monitoring reduces the number of office visits required to check your device to one time per year. It allows us  to keep an eye on the functioning of your device to ensure it is working properly.

## 2023-06-30 NOTE — Progress Notes (Signed)
 HPI Wayne Mitchell returns today for followup. He is a pleasant 88 yo man with atrial flutter who went ablation and PPM insertion due to sinus node dysfunction. He has done well in the interim with no chest pain or sob. He has CAD and is s/p CABG and can walk on flat ground without limit. No syncope. He notes that his bp has been up in the 140-150 range.  Allergies  Allergen Reactions   Lisinopril Cough     Current Outpatient Medications  Medication Sig Dispense Refill   amLODipine  (NORVASC ) 5 MG tablet 5 mg daily.     atorvastatin  (LIPITOR) 20 MG tablet Take 20 mg by mouth daily.     carvedilol  (COREG ) 6.25 MG tablet TAKE 1 TABLET TWICE DAILY 60 tablet 0   ELIQUIS  5 MG TABS tablet TAKE 1 TABLET TWICE DAILY 180 tablet 1   furosemide  (LASIX ) 20 MG tablet Take 1 tablet (20 mg total) by mouth daily as needed (for swelling/weight gain). 30 tablet 6   levothyroxine  (SYNTHROID , LEVOTHROID) 112 MCG tablet Take 112 mcg by mouth daily before breakfast.     pantoprazole  (PROTONIX ) 40 MG tablet Take 40 mg by mouth 2 (two) times daily.      No current facility-administered medications for this visit.     Past Medical History:  Diagnosis Date   Carotid artery disease (HCC)    Colon polyp    small   Coronary artery disease    Coronary artery disease involving native coronary artery of native heart with unstable angina pectoris (HCC) 05/30/2017   3V CABG 06/02/17   HTN (hypertension)    Hx of CABG    Hypothyroidism    Syncope    Syncope    Thrombocytopenia (HCC)     ROS:   All systems reviewed and negative except as noted in the HPI.   Past Surgical History:  Procedure Laterality Date   A-FLUTTER ABLATION N/A 01/15/2019   Procedure: A-FLUTTER ABLATION;  Surgeon: Verona Goodwill, MD;  Location: [facility] INVASIVE CV LAB;  Service: Cardiovascular;  Laterality: N/A;   BALLOON DILATION N/A 02/13/2018   Procedure: BALLOON DILATION;  Surgeon: Genell Ken, MD;  Location: [facility] ENDOSCOPY;  Service:  Gastroenterology;  Laterality: N/A;   BIOPSY  02/13/2018   Procedure: BIOPSY;  Surgeon: Genell Ken, MD;  Location: [facility] ENDOSCOPY;  Service: Gastroenterology;;   CARDIOVERSION N/A 12/21/2018   Procedure: CARDIOVERSION;  Surgeon: Hazle Lites, MD;  Location: [facility] ENDOSCOPY;  Service: Cardiovascular;  Laterality: N/A;   CATARACT EXTRACTION Left    CORONARY ARTERY BYPASS GRAFT N/A 06/02/2017   Procedure: CORONARY ARTERY BYPASS GRAFTING (CABG) x3 Using Left Internal Mammary Artery and Right Leg Greater Saphenous Vein harvested endoscopically;  Surgeon: Heriberto London, MD;  Location: [facility] OR;  Service: Open Heart Surgery;  Laterality: N/A;   ESOPHAGOGASTRODUODENOSCOPY N/A 10/23/2014   Procedure: ESOPHAGOGASTRODUODENOSCOPY (EGD);  Surgeon: Lanita Pitman, MD;  Location: [facility] ENDOSCOPY;  Service: Endoscopy;  Laterality: N/A;   ESOPHAGOGASTRODUODENOSCOPY (EGD) WITH PROPOFOL  N/A 02/13/2018   Procedure: ESOPHAGOGASTRODUODENOSCOPY (EGD) WITH PROPOFOL ;  Surgeon: Genell Ken, MD;  Location: [facility] ENDOSCOPY;  Service: Gastroenterology;  Laterality: N/A;   FOREIGN BODY REMOVAL  02/13/2018   Procedure: FOREIGN BODY REMOVAL;  Surgeon: Genell Ken, MD;  Location: [facility] ENDOSCOPY;  Service: Gastroenterology;;   GAS/FLUID EXCHANGE Right 10/24/2017   Procedure: GAS/FLUID EXCHANGE;  Surgeon: Jearline Minder, MD;  Location: [facility] OR;  Service: Ophthalmology;  Laterality: Right;  SF6   HEMORRHOID SURGERY  LEFT HEART CATH AND CORONARY ANGIOGRAPHY N/A 05/30/2017   Procedure: LEFT HEART CATH AND CORONARY ANGIOGRAPHY;  Surgeon: Arnoldo Lapping, MD;  Location: [facility] INVASIVE CV LAB;  Service: Cardiovascular;  Laterality: N/A;   PACEMAKER IMPLANT N/A 05/17/2019   Procedure: PACEMAKER IMPLANT;  Surgeon: Tammie Fall, MD;  Location: [facility] INVASIVE CV LAB;  Service: Cardiovascular;  Laterality: N/A;   PARS PLANA VITRECTOMY Right 10/24/2017   Procedure: PARS PLANA VITRECTOMY WITH 25 GAUGE;  Surgeon: Jearline Minder, MD;  Location: [facility] OR;  Service:  Ophthalmology;  Laterality: Right;   PHOTOCOAGULATION Right 10/24/2017   Procedure: PHOTOCOAGULATION;  Surgeon: Jearline Minder, MD;  Location: [facility] OR;  Service: Ophthalmology;  Laterality: Right;   REFRACTIVE SURGERY Right    TEE WITHOUT CARDIOVERSION N/A 06/02/2017   Procedure: TRANSESOPHAGEAL ECHOCARDIOGRAM (TEE);  Surgeon: Matt Song, Donata Fryer, MD;  Location: [facility] OR;  Service: Open Heart Surgery;  Laterality: N/A;     Family History  Problem Relation Age of Onset   Pancreatic cancer Mother    Healthy Brother      Social History   Socioeconomic History   Marital status: Married    Spouse name: Not on file   Number of children: Not on file   Years of education: Not on file   Highest education level: Not on file  Occupational History   Occupation: retired  Tobacco Use   Smoking status: Never   Smokeless tobacco: Never  Vaping Use   Vaping status: Never Used  Substance and Sexual Activity   Alcohol use: Yes    Alcohol/week: 4.0 standard drinks of alcohol    Types: 4 Cans of beer per week   Drug use: No   Sexual activity: Not on file  Other Topics Concern   Not on file  Social History Narrative   Not on file   Social Drivers of Health   Financial Resource Strain: Not on file  Food Insecurity: Not on file  Transportation Needs: Not on file  Physical Activity: Not on file  Stress: Not on file  Social Connections: Not on file  Intimate Partner Violence: Not on file     BP (!) 164/60   Pulse 60   Ht 5\' 8"  (1.727 m)   Wt 148 lb (67.1 kg)   SpO2 98%   BMI 22.50 kg/m   Physical Exam:  Well appearing NAD HEENT: Unremarkable Neck:  No JVD, no thyromegally Lymphatics:  No adenopathy Back:  No CVA tenderness Lungs:  Clear HEART:  Regular rate rhythm, no murmurs, no rubs, no clicks Abd:  soft, positive bowel sounds, no organomegally, no rebound, no guarding Ext:  2 plus pulses, no edema, no cyanosis, no clubbing Skin:  No rashes no nodules Neuro:  CN II through  XII intact, motor grossly intact  EKG - NSR with atrial pacing  DEVICE  Normal device function.  See PaceArt for details.    Assess/Plan: 1. Atrial fib/flutter  - he is maintaining NSR 99% of the time. He will continue his current meds. 2. Sinus node dysfunction - he is asymptomatic, s/p PPM insertion. 3. PPM - his [employer] DDD PM is working normally. No change. 4. CAD - he denies anginal symptoms. No change in his meds. I encouraged him to walk more. 5. HTN - his bp is not well controlled. I have asked him to increase the amlodipine  to 10 mg daily. If his legs swell, we will increase the coreg .    Debbi Failing.D.

## 2023-07-25 DIAGNOSIS — I10 Essential (primary) hypertension: Secondary | ICD-10-CM | POA: Diagnosis not present

## 2023-07-25 DIAGNOSIS — R600 Localized edema: Secondary | ICD-10-CM | POA: Diagnosis not present

## 2023-07-30 DIAGNOSIS — I25810 Atherosclerosis of coronary artery bypass graft(s) without angina pectoris: Secondary | ICD-10-CM | POA: Diagnosis not present

## 2023-07-30 DIAGNOSIS — N1831 Chronic kidney disease, stage 3a: Secondary | ICD-10-CM | POA: Diagnosis not present

## 2023-07-30 DIAGNOSIS — I5032 Chronic diastolic (congestive) heart failure: Secondary | ICD-10-CM | POA: Diagnosis not present

## 2023-07-30 DIAGNOSIS — I10 Essential (primary) hypertension: Secondary | ICD-10-CM | POA: Diagnosis not present

## 2023-08-07 DIAGNOSIS — I10 Essential (primary) hypertension: Secondary | ICD-10-CM | POA: Diagnosis not present

## 2023-08-07 DIAGNOSIS — I5032 Chronic diastolic (congestive) heart failure: Secondary | ICD-10-CM | POA: Diagnosis not present

## 2023-08-12 ENCOUNTER — Ambulatory Visit (INDEPENDENT_AMBULATORY_CARE_PROVIDER_SITE_OTHER): Payer: Medicare HMO

## 2023-08-12 DIAGNOSIS — I495 Sick sinus syndrome: Secondary | ICD-10-CM | POA: Diagnosis not present

## 2023-08-12 LAB — CUP PACEART REMOTE DEVICE CHECK
Battery Remaining Longevity: 72 mo
Battery Remaining Percentage: 65 %
Battery Voltage: 3.01 V
Brady Statistic AP VP Percent: 8.4 %
Brady Statistic AP VS Percent: 46 %
Brady Statistic AS VP Percent: 1.5 %
Brady Statistic AS VS Percent: 43 %
Brady Statistic RA Percent Paced: 54 %
Brady Statistic RV Percent Paced: 10 %
Date Time Interrogation Session: 20250701020020
Implantable Lead Connection Status: 753985
Implantable Lead Connection Status: 753985
Implantable Lead Implant Date: 20210405
Implantable Lead Implant Date: 20210405
Implantable Lead Location: 753859
Implantable Lead Location: 753860
Implantable Pulse Generator Implant Date: 20210405
Lead Channel Impedance Value: 400 Ohm
Lead Channel Impedance Value: 410 Ohm
Lead Channel Pacing Threshold Amplitude: 0.75 V
Lead Channel Pacing Threshold Amplitude: 1 V
Lead Channel Pacing Threshold Pulse Width: 0.5 ms
Lead Channel Pacing Threshold Pulse Width: 0.5 ms
Lead Channel Sensing Intrinsic Amplitude: 12 mV
Lead Channel Sensing Intrinsic Amplitude: 2.1 mV
Lead Channel Setting Pacing Amplitude: 2 V
Lead Channel Setting Pacing Amplitude: 2.5 V
Lead Channel Setting Pacing Pulse Width: 0.5 ms
Lead Channel Setting Sensing Sensitivity: 2 mV
Pulse Gen Model: 2272
Pulse Gen Serial Number: 3815020

## 2023-08-13 ENCOUNTER — Ambulatory Visit: Payer: Self-pay | Admitting: Internal Medicine

## 2023-08-28 DIAGNOSIS — I5032 Chronic diastolic (congestive) heart failure: Secondary | ICD-10-CM | POA: Diagnosis not present

## 2023-08-28 DIAGNOSIS — I25810 Atherosclerosis of coronary artery bypass graft(s) without angina pectoris: Secondary | ICD-10-CM | POA: Diagnosis not present

## 2023-08-28 DIAGNOSIS — N1831 Chronic kidney disease, stage 3a: Secondary | ICD-10-CM | POA: Diagnosis not present

## 2023-08-28 DIAGNOSIS — I10 Essential (primary) hypertension: Secondary | ICD-10-CM | POA: Diagnosis not present

## 2023-09-11 DIAGNOSIS — E7800 Pure hypercholesterolemia, unspecified: Secondary | ICD-10-CM | POA: Diagnosis not present

## 2023-09-11 DIAGNOSIS — I10 Essential (primary) hypertension: Secondary | ICD-10-CM | POA: Diagnosis not present

## 2023-09-11 DIAGNOSIS — I25810 Atherosclerosis of coronary artery bypass graft(s) without angina pectoris: Secondary | ICD-10-CM | POA: Diagnosis not present

## 2023-09-11 DIAGNOSIS — I5032 Chronic diastolic (congestive) heart failure: Secondary | ICD-10-CM | POA: Diagnosis not present

## 2023-09-11 DIAGNOSIS — N1831 Chronic kidney disease, stage 3a: Secondary | ICD-10-CM | POA: Diagnosis not present

## 2023-09-11 DIAGNOSIS — E039 Hypothyroidism, unspecified: Secondary | ICD-10-CM | POA: Diagnosis not present

## 2023-09-27 DIAGNOSIS — N1831 Chronic kidney disease, stage 3a: Secondary | ICD-10-CM | POA: Diagnosis not present

## 2023-09-27 DIAGNOSIS — I10 Essential (primary) hypertension: Secondary | ICD-10-CM | POA: Diagnosis not present

## 2023-09-27 DIAGNOSIS — I25810 Atherosclerosis of coronary artery bypass graft(s) without angina pectoris: Secondary | ICD-10-CM | POA: Diagnosis not present

## 2023-09-27 DIAGNOSIS — I5032 Chronic diastolic (congestive) heart failure: Secondary | ICD-10-CM | POA: Diagnosis not present

## 2023-10-12 DIAGNOSIS — I10 Essential (primary) hypertension: Secondary | ICD-10-CM | POA: Diagnosis not present

## 2023-10-12 DIAGNOSIS — I25810 Atherosclerosis of coronary artery bypass graft(s) without angina pectoris: Secondary | ICD-10-CM | POA: Diagnosis not present

## 2023-10-12 DIAGNOSIS — N1831 Chronic kidney disease, stage 3a: Secondary | ICD-10-CM | POA: Diagnosis not present

## 2023-10-12 DIAGNOSIS — E039 Hypothyroidism, unspecified: Secondary | ICD-10-CM | POA: Diagnosis not present

## 2023-10-12 DIAGNOSIS — I5032 Chronic diastolic (congestive) heart failure: Secondary | ICD-10-CM | POA: Diagnosis not present

## 2023-10-12 DIAGNOSIS — E7800 Pure hypercholesterolemia, unspecified: Secondary | ICD-10-CM | POA: Diagnosis not present

## 2023-10-15 DIAGNOSIS — N1831 Chronic kidney disease, stage 3a: Secondary | ICD-10-CM | POA: Diagnosis not present

## 2023-10-15 DIAGNOSIS — I5032 Chronic diastolic (congestive) heart failure: Secondary | ICD-10-CM | POA: Diagnosis not present

## 2023-10-15 DIAGNOSIS — Z1389 Encounter for screening for other disorder: Secondary | ICD-10-CM | POA: Diagnosis not present

## 2023-10-15 DIAGNOSIS — I4892 Unspecified atrial flutter: Secondary | ICD-10-CM | POA: Diagnosis not present

## 2023-10-15 DIAGNOSIS — I25810 Atherosclerosis of coronary artery bypass graft(s) without angina pectoris: Secondary | ICD-10-CM | POA: Diagnosis not present

## 2023-10-15 DIAGNOSIS — E7800 Pure hypercholesterolemia, unspecified: Secondary | ICD-10-CM | POA: Diagnosis not present

## 2023-10-15 DIAGNOSIS — D696 Thrombocytopenia, unspecified: Secondary | ICD-10-CM | POA: Diagnosis not present

## 2023-10-15 DIAGNOSIS — I10 Essential (primary) hypertension: Secondary | ICD-10-CM | POA: Diagnosis not present

## 2023-10-15 DIAGNOSIS — Z23 Encounter for immunization: Secondary | ICD-10-CM | POA: Diagnosis not present

## 2023-10-15 DIAGNOSIS — E039 Hypothyroidism, unspecified: Secondary | ICD-10-CM | POA: Diagnosis not present

## 2023-10-15 DIAGNOSIS — R7303 Prediabetes: Secondary | ICD-10-CM | POA: Diagnosis not present

## 2023-10-15 DIAGNOSIS — Z0000 Encounter for general adult medical examination without abnormal findings: Secondary | ICD-10-CM | POA: Diagnosis not present

## 2023-10-27 DIAGNOSIS — I10 Essential (primary) hypertension: Secondary | ICD-10-CM | POA: Diagnosis not present

## 2023-10-27 DIAGNOSIS — N1831 Chronic kidney disease, stage 3a: Secondary | ICD-10-CM | POA: Diagnosis not present

## 2023-10-27 DIAGNOSIS — I5032 Chronic diastolic (congestive) heart failure: Secondary | ICD-10-CM | POA: Diagnosis not present

## 2023-10-27 DIAGNOSIS — I25810 Atherosclerosis of coronary artery bypass graft(s) without angina pectoris: Secondary | ICD-10-CM | POA: Diagnosis not present

## 2023-10-30 ENCOUNTER — Telehealth: Payer: Self-pay | Admitting: Internal Medicine

## 2023-10-30 NOTE — Telephone Encounter (Signed)
 Pt c/o BP issue: STAT if pt c/o blurred vision, one-sided weakness or slurred speech.  STAT if BP is GREATER than 180/120 TODAY.  STAT if BP is LESS than 90/60 and SYMPTOMATIC TODAY  1. What is your BP concern? Too low   2. Have you taken any BP medication today?Yes  3. What are your last 5 BP readings? 169/62 116/45 126/45 127/64 160/72  4. Are you having any other symptoms (ex. Dizziness, headache, blurred vision, passed out)? No

## 2023-10-30 NOTE — Telephone Encounter (Signed)
 Wayne Mitchell tells after his recent pacemaker interrogation he was told he is dehydrated. He enjoys playing golf and he tells me he drinks a couple beers every afternoon and keeps a bottle of water  out everyday but rarely drinks it. He had recently bought some flavor packets for his water  but he had forgotten he had them. He otherwise feels fine,no c/o dizziness,weakness,etc. He will continue to monitor BP and he will try to increase his water  intake and seems excited about adding the flavorings.He has Gatorade orange to try. He was due for annual follow up with Dr.Skains and he was not aware of this. I have messaged schedulers to reach out to patient and schedule appointment.

## 2023-11-11 ENCOUNTER — Ambulatory Visit: Payer: Medicare HMO

## 2023-11-11 ENCOUNTER — Encounter (HOSPITAL_BASED_OUTPATIENT_CLINIC_OR_DEPARTMENT_OTHER): Payer: Self-pay | Admitting: Family

## 2023-11-11 ENCOUNTER — Ambulatory Visit (HOSPITAL_BASED_OUTPATIENT_CLINIC_OR_DEPARTMENT_OTHER): Admitting: Family

## 2023-11-11 VITALS — BP 140/50 | HR 60 | Ht 68.0 in | Wt 150.0 lb

## 2023-11-11 DIAGNOSIS — I10 Essential (primary) hypertension: Secondary | ICD-10-CM | POA: Diagnosis not present

## 2023-11-11 DIAGNOSIS — E785 Hyperlipidemia, unspecified: Secondary | ICD-10-CM | POA: Diagnosis not present

## 2023-11-11 DIAGNOSIS — I480 Paroxysmal atrial fibrillation: Secondary | ICD-10-CM | POA: Diagnosis not present

## 2023-11-11 DIAGNOSIS — I25810 Atherosclerosis of coronary artery bypass graft(s) without angina pectoris: Secondary | ICD-10-CM | POA: Diagnosis not present

## 2023-11-11 DIAGNOSIS — E039 Hypothyroidism, unspecified: Secondary | ICD-10-CM | POA: Diagnosis not present

## 2023-11-11 DIAGNOSIS — D6859 Other primary thrombophilia: Secondary | ICD-10-CM | POA: Diagnosis not present

## 2023-11-11 DIAGNOSIS — H524 Presbyopia: Secondary | ICD-10-CM | POA: Diagnosis not present

## 2023-11-11 DIAGNOSIS — H35372 Puckering of macula, left eye: Secondary | ICD-10-CM | POA: Diagnosis not present

## 2023-11-11 DIAGNOSIS — Z961 Presence of intraocular lens: Secondary | ICD-10-CM | POA: Diagnosis not present

## 2023-11-11 DIAGNOSIS — H43813 Vitreous degeneration, bilateral: Secondary | ICD-10-CM | POA: Diagnosis not present

## 2023-11-11 DIAGNOSIS — I5032 Chronic diastolic (congestive) heart failure: Secondary | ICD-10-CM | POA: Diagnosis not present

## 2023-11-11 DIAGNOSIS — I483 Typical atrial flutter: Secondary | ICD-10-CM | POA: Diagnosis not present

## 2023-11-11 DIAGNOSIS — H52203 Unspecified astigmatism, bilateral: Secondary | ICD-10-CM | POA: Diagnosis not present

## 2023-11-11 DIAGNOSIS — H02401 Unspecified ptosis of right eyelid: Secondary | ICD-10-CM | POA: Diagnosis not present

## 2023-11-11 DIAGNOSIS — H04123 Dry eye syndrome of bilateral lacrimal glands: Secondary | ICD-10-CM | POA: Diagnosis not present

## 2023-11-11 DIAGNOSIS — E7800 Pure hypercholesterolemia, unspecified: Secondary | ICD-10-CM | POA: Diagnosis not present

## 2023-11-11 DIAGNOSIS — I25118 Atherosclerotic heart disease of native coronary artery with other forms of angina pectoris: Secondary | ICD-10-CM | POA: Diagnosis not present

## 2023-11-11 DIAGNOSIS — H31003 Unspecified chorioretinal scars, bilateral: Secondary | ICD-10-CM | POA: Diagnosis not present

## 2023-11-11 LAB — BASIC METABOLIC PANEL WITH GFR
BUN/Creatinine Ratio: 14 (ref 10–24)
BUN: 17 mg/dL (ref 8–27)
CO2: 21 mmol/L (ref 20–29)
Calcium: 9.1 mg/dL (ref 8.6–10.2)
Chloride: 97 mmol/L (ref 96–106)
Creatinine, Ser: 1.25 mg/dL (ref 0.76–1.27)
Glucose: 78 mg/dL (ref 70–99)
Potassium: 4.5 mmol/L (ref 3.5–5.2)
Sodium: 135 mmol/L (ref 134–144)
eGFR: 56 mL/min/1.73 — ABNORMAL LOW (ref >59)

## 2023-11-11 MED ORDER — CARVEDILOL 12.5 MG PO TABS: 12.5000 mg | ORAL_TABLET | Freq: Two times a day (BID) | ORAL | 3 refills | Status: DC

## 2023-11-11 NOTE — Progress Notes (Signed)
 Cardiology Office Note   Date:  11/11/2023  ID:  Wayne Mitchell, DOB Sep 06, 1935, MRN [id number] PCP: Ransom Other, Wayne Mitchell  [facility] Providers Cardiologist:  Oneil Parchment, Wayne Mitchell  Cardiology APP:  Vannie Reche RAMAN, NP  Electrophysiologist:  Danelle Birmingham, Wayne Mitchell     History of Present Illness Wayne Mitchell is a 88 y.o. male with hx of CAD s/p CABG x3, SSS s/p PPM, HTN, hypothyroidism, carotid artery disease, PAF.  Seen 06/2023 by Dr. Birmingham. His BP was elevated and Amlodipine  increased to 10mg . He had reported lower extremity edema on this. PCP d/c'ed 07/2023 and started losartan 50mg  daily. Seen 10/15/2023 by PCP and BP remained elevated with home readings on 160s. Losartan d/c'ed and valsartan 320mg  daily initiated.  He called to office on 10/30/2023 with concerns of hypotension, with variable systolic BP of 116-169, without ligtheadedness or dizziness.   He presents today for annual follow up. He notes over the last few weeks his pace is slower than normal. He is still able to golf without limitations, notes that he rides but walks on the course as well. He denies chest pain, pressure, tightness. Denies shortness of breath, lightheadedness, dizziness, headaches, vision changes, edema, PND, orthopnea. Leg swelling resolved with d/c of amlodipine . He notes taking lasix  20mg  daily, instead of PRN, and has for >2 years. He checks his BP at home twice daily with readings 140s-160s and has a BP cuff that his PCP office monitors as well. He stays active with golfing weekly and does home exercises online through [employer]. He sleeps 5.5-6.5 hrs nightly and wakes up 1-2x to use the bathroom with no difficulty falling back asleep. Most meals are prepared at home with minimal to no added salt. Rare water  intake. Consumes 2-3 beers daily. No reports bleeding complications on eliquis .   ROS: Please see the history of present illness.    All other systems reviewed and are negative.   Studies  Reviewed      Cardiac Studies & Procedures   ______________________________________________________________________________________________ CARDIAC CATHETERIZATION  CARDIAC CATHETERIZATION 05/30/2017  Conclusion 1. Critical stenosis at the ostium of the LAD, large vessel, appears to be a suitable target for grafting 2. Severe stenosis of the first OM branch of the circumflex 3. Patent RCA, unable to selectively engage, suspected codominant vessel 4. Mild segmental LV contraction abnormality with LVEF estimated at 50-55%  Plan: IV heparin , TCTS consultation for consideration of CABG  Findings Coronary Findings Diagnostic  Dominance: Left  Left Anterior Descending Ost LAD to Prox LAD lesion is 99% stenosed. The lesion is calcified. Critical stenosis at the LAD origin, extending back to the left main with no landing zone for stenting  Left Circumflex Prox Cx lesion is 50% stenosed.  First Obtuse Marginal Branch Ost 1st Mrg lesion is 90% stenosed. severe stenosis at the origin of a large first OM branch  Right Coronary Artery The vessel is moderately calcified. The RCA is patent, but could not be selectively injected. Multiple diagnostic and guide catheters are attempted. Suspect nondominant or codominant RCA. No severe stenosis identified.  Intervention  No interventions have been documented.     ECHOCARDIOGRAM  ECHOCARDIOGRAM COMPLETE 06/05/2017  Narrative *[place]* *[facility]* [address] [place], [place] [address] [phone]  ------------------------------------------------------------------- Transthoracic Echocardiography  Patient:    Wayne Mitchell, Wayne Mitchell MR #:       [id number] Study Date: 06/05/2017 Gender:     M Age:        84 Height:  170.2 cm Weight:     80.2 kg BSA:        1.97 m^2 Pt. Status: Room:       2H19C  ATTENDING    Wayne Mitchell REFERRING    Wayne Mitchell, Mitchell ALBRIGHT    Wayne Fell, Wayne Mitchell PERFORMING   Wayne Mitchell, Wayne SONOGRAPHER  Wayne Mitchell  cc:  ------------------------------------------------------------------- LV EF: 60% -   65%  ------------------------------------------------------------------- Indications:      Cardiomyopathy - ischemic 414.8.  ------------------------------------------------------------------- History:   PMH:  SVT. Dizziness.  Syncope.  Angina pectoris. Coronary artery disease.  Risk factors:  Hypertension.  ------------------------------------------------------------------- Study Conclusions  - Procedure narrative: Transthoracic echocardiography. Image quality was poor. The study was technically difficult, as a result of poor acoustic windows and poor sound wave transmission. - Left ventricle: The cavity size was normal. Wall thickness was increased in a pattern of moderate LVH. Systolic function was normal. The estimated ejection fraction was in the range of 60% to 65%. Incoordinate septal motion. Doppler parameters are consistent with abnormal left ventricular relaxation (grade 1 diastolic dysfunction). The E/e&' ratio is >15, suggesting elevated LV filling pressure. - Aortic valve: Poorly visualized. There was trivial regurgitation. - Mitral valve: Mildly thickened leaflets . There was trivial regurgitation. - Left atrium: The atrium was normal in size.  Impressions:  - Technically difficult study. LVEF 60-65%, moderate LVH, incoordinate septal motion, grade 1 DD, elevated LV filling pressure, trivial AI, trivial MR, normal LA size.  ------------------------------------------------------------------- Labs, prior tests, procedures, and surgery: Coronary artery bypass grafting.  ------------------------------------------------------------------- Study data:  Comparison was made to the study of 06/01/2017.  Study status:  Routine.  Procedure:  Imaging difficult. Chest tube present. Wound dressing in subcostal region.  Windows were extremely respiratory dependent. The patient reported no pain pre or post test. Transthoracic echocardiography. Image quality was poor. The study was technically difficult, as a result of poor acoustic windows and poor sound wave transmission.  Study completion:  There were no complications.          Transthoracic echocardiography. M-mode, complete 2D, spectral Doppler, and color Doppler. Birthdate:  Patient birthdate: 1935-09-28.  Age:  Patient is 88 yr old.  Sex:  Gender: male.    BMI: 27.7 kg/m^2.  Blood pressure: 125/68  Patient status:  Wayne.  Study date:  Study date: 06/05/2017. Study time: 10:31 AM.  Location:  ICU/CCU  -------------------------------------------------------------------  ------------------------------------------------------------------- Left ventricle:  The cavity size was normal. Wall thickness was increased in a pattern of moderate LVH. Systolic function was normal. The estimated ejection fraction was in the range of 60% to 65%. Incoordinate septal motion. Doppler parameters are consistent with abnormal left ventricular relaxation (grade 1 diastolic dysfunction). The E/e&' ratio is >15, suggesting elevated LV filling pressure.  ------------------------------------------------------------------- Aortic valve:  Poorly visualized.  Doppler:  There was trivial regurgitation.  ------------------------------------------------------------------- Aorta:  Aortic root: The aortic root was normal in size. Ascending aorta: The ascending aorta was normal in size.  ------------------------------------------------------------------- Mitral valve:   Mildly thickened leaflets .  Doppler:  There was trivial regurgitation.    Valve area by pressure half-time: 4.23 cm^2. Indexed valve area by pressure half-time: 2.15 cm^2/m^2. Peak gradient (D): 3 mm Hg.  ------------------------------------------------------------------- Left atrium:  The atrium was  normal in size.  ------------------------------------------------------------------- Atrial septum:  Poorly visualized.  ------------------------------------------------------------------- Right ventricle:  Poorly visualized.  ------------------------------------------------------------------- Pulmonic valve:   Poorly visualized.  Doppler:  There was no significant regurgitation.  -------------------------------------------------------------------  Tricuspid valve:   Doppler:  There was no significant regurgitation.  ------------------------------------------------------------------- Pulmonary artery:   Poorly visualized.  ------------------------------------------------------------------- Right atrium:  The atrium was normal in size.  ------------------------------------------------------------------- Pericardium:  There was no pericardial effusion.  ------------------------------------------------------------------- Systemic veins:  Not visualized.  ------------------------------------------------------------------- Measurements  Left ventricle                           Value          Reference LV ID, ED, PLAX chordal        (L)       31.9  mm       43 - 52 LV ID, ES, PLAX chordal        (L)       20.6  mm       23 - 38 LV fx shortening, PLAX chordal           35    %        >=29 LV PW thickness, ED                      13.7  mm       ---------- IVS/LV PW ratio, ED                      1.04           <=1.3 Stroke volume, 2D                        53    ml       ---------- Stroke volume/bsa, 2D                    27    ml/m^2   ---------- LV ejection fraction, 1-p A4C            50    %        ---------- LV end-diastolic volume, 2-p             57    ml       ---------- LV end-systolic volume, 2-p              23    ml       ---------- LV ejection fraction, 2-p                59    %        ---------- Stroke volume, 2-p                       34    ml       ---------- LV  end-diastolic volume/bsa,             29    ml/m^2   ---------- 2-p LV end-systolic volume/bsa,              12    ml/m^2   ---------- 2-p Stroke volume/bsa, 2-p                   17.1  ml/m^2   ---------- LV e&', lateral                           4.68  cm/s     ---------- LV E/e&', lateral  19.06          ---------- LV e&', medial                            4.46  cm/s     ---------- LV E/e&', medial                          20             ---------- LV e&', average                           4.57  cm/s     ---------- LV E/e&', average                         19.52          ----------  Ventricular septum                       Value          Reference IVS thickness, ED                        14.2  mm       ----------  LVOT                                     Value          Reference LVOT ID, S                               21    mm       ---------- LVOT area                                3.46  cm^2     ---------- LVOT peak velocity, S                    103   cm/s     ---------- LVOT mean velocity, S                    63.7  cm/s     ---------- LVOT VTI, S                              15.4  cm       ---------- LVOT peak gradient, S                    4     mm Hg    ----------  Aortic valve                             Value          Reference Aortic regurg pressure                   554   ms       ---------- half-time  Aorta  Value          Reference Aortic root ID, ED                       32    mm       ----------  Left atrium                              Value          Reference LA ID, A-P, ES                           34    mm       ---------- LA ID/bsa, A-P                           1.73  cm/m^2   <=2.2 LA volume, S                             19.7  ml       ---------- LA volume/bsa, S                         10    ml/m^2   ---------- LA volume, ES, 1-p A4C                   14.5  ml       ---------- LA volume/bsa, ES,  1-p A4C               7.4   ml/m^2   ---------- LA volume, ES, 1-p A2C                   25.4  ml       ---------- LA volume/bsa, ES, 1-p A2C               12.9  ml/m^2   ----------  Mitral valve                             Value          Reference Mitral E-wave peak velocity              89.2  cm/s     ---------- Mitral A-wave peak velocity              62.6  cm/s     ---------- Mitral deceleration time                 176   ms       150 - 230 Mitral pressure half-time                52    ms       ---------- Mitral peak gradient, D                  3     mm Hg    ---------- Mitral E/A ratio, peak                   1.4            ---------- Mitral valve area, PHT, DP  4.23  cm^2     ---------- Mitral valve area/bsa, PHT, DP           2.15  cm^2/m^2 ----------  Right atrium                             Value          Reference RA ID, S-I, ES, A4C                      48    mm       34 - 49 RA area, ES, A4C                         12.3  cm^2     8.3 - 19.5 RA volume, ES, A/L                       25.2  ml       ---------- RA volume/bsa, ES, A/L                   12.8  ml/m^2   ----------  Legend: (L)  and  (H)  mark values outside specified reference range.  ------------------------------------------------------------------- Prepared and Electronically Authenticated by  Wayne Mitchell 2019-04-25T15:21:50   TEE  ECHO TEE 06/02/2017  Narrative *[place]* *[facility]* [address] [place], [place] [address] [phone]  ------------------------------------------------------------------- Intraoperative Transesophageal Echocardiography  Patient:    Wayne Mitchell, Wayne Mitchell MR #:       [id number] Study Date: 06/02/2017 Gender:     M Age:        32 Height:     170.2 cm Weight:     70.5 kg BSA:        1.83 m^2 Pt. Status: Room:       2H19C  ATTENDING    Wayne Mitchell, Wayne Mitchell REFERRING    Wayne Mitchell, Mitchell ALBRIGHT     Wayne Fell, Wayne Mitchell PERFORMING   Kelly Mace, Wayne Mitchell SONOGRAPHER  Vernell Hammersmith, RDCS  cc:  ------------------------------------------------------------------- LV EF: 45% -   50%  ------------------------------------------------------------------- Indications:     CABG by-pass RWMA  ------------------------------------------------------------------- History:   PMH:  PVD. GERD. Hypothyroidism. Renal disease.  Chest pain.  Acute myocardial infarction.  Coronary artery disease.  Risk factors:  Hypertension.  ------------------------------------------------------------------- Study Conclusions  - Left ventricle: Systolic function was mildly reduced. The estimated ejection fraction was in the range of 45% to 50%. Mild diffuse hypokinesis. - Mitral valve: There was mild regurgitation directed centrally. - Staged echo: Limited post CPB exam: The patient separated easily from CPB. Overall LV function appears normal, with no segmental wall motion abnormalities. Approximate EF 50-55%. No change post bypass in mitral valve function. Trivial MR remains. No change post bypass in aortic valve function. Trivial AI remains.  Impressions:  - Slight LV function improvement from pre-bypass images. No other significant changes from pre-bypass images.  ------------------------------------------------------------------- Study data:   Study status:  Routine.  Consent:  The risks, benefits, and alternatives to the procedure were explained to the patient and informed consent was obtained.  Procedure:  The patient reported no pain pre or post test. Sedation. General anesthesia was administered by anesthesiology staff. Transesophageal echocardiography. An adult multiplane transesophageal probe was inserted by the anesthesiologistwithout difficulty. Image quality was adequate.  Study completion:  There were  no complications. Intraoperative transesophageal echocardiography.  Birthdate: Patient  birthdate: Aug 16, 1935.  Age:  Patient is 88 yr old.  Sex: Gender: male.    BMI: 24.3 kg/m^2.  Blood pressure:     113/56 Patient status:  Wayne.  Study date:  Study date: 06/02/2017. Study time: 07:58 AM.  Location:  Operating room.  -------------------------------------------------------------------  ------------------------------------------------------------------- Left ventricle:  Systolic function was mildly reduced. The estimated ejection fraction was in the range of 45% to 50%. Mild diffuse hypokinesis.  ------------------------------------------------------------------- Aortic valve:   Normal-sized, mildly calcified annulus. Trileaflet; normal thickness, noncalcified leaflets. Cusp separation was normal. Mobility was not restricted.  No evidence of vegetation. Doppler:  Transvalvular velocity was within the normal range. There was no stenosis. There was trivial regurgitation.  ------------------------------------------------------------------- Aorta:  The aorta was mildly atherosclerotic.  ------------------------------------------------------------------- Mitral valve:   Normal-sized, noncalcified annulus. Structurally normal valve. Normal thickness leaflets . Leaflet separation was normal. Mobility was not restricted. No echocardiographic evidence for prolapse.  No evidence of vegetation.  Doppler:  Transvalvular velocity was within the normal range. There was no evidence for stenosis. There was mild regurgitation directed centrally.  ------------------------------------------------------------------- Left atrium:  The atrium was normal in size.  No evidence of thrombus in the atrial cavity or appendage.  ------------------------------------------------------------------- Atrial septum:  No defect or patent foramen ovale was identified by color doppler interrogation.  ------------------------------------------------------------------- Pulmonary veins:   Visualization of the pulmonary venous anatomy is incomplete, but a significant abnormality is unlikely.  ------------------------------------------------------------------- Right ventricle:  The cavity size was normal. Wall thickness was normal. Systolic function was normal.  ------------------------------------------------------------------- Pulmonic valve:    Structurally normal valve.   Cusp separation was normal.  No evidence of vegetation.  Doppler:  There was trivial regurgitation around the PA catheter.  ------------------------------------------------------------------- Tricuspid valve:   Structurally normal valve.   Leaflet separation was normal.  No evidence of vegetation.  Doppler:  There was trivial regurgitation.  ------------------------------------------------------------------- Pulmonary artery:   The main pulmonary artery was normal-sized.  ------------------------------------------------------------------- Right atrium:  The atrium was normal in size.  No evidence of thrombus.  ------------------------------------------------------------------- Pre bypass:  Post bypass:  ------------------------------------------------------------------- Post procedure conclusions Left ventricle:  Limited post CPB exam: The patient separated easily from CPB. Overall LV function appears normal, with no segmental wall motion abnormalities. Approximate EF 50-55%. Aortic valve:  - No change post bypass in mitral valve function. Trivial MR remains.  Mitral valve:  - No change post bypass in aortic valve function. Trivial AI remains.  Ascending Aorta:  - The aorta was mildly atherosclerotic.  ------------------------------------------------------------------- Prepared and Electronically Authenticated by  Kelly Mace, Wayne Mitchell 2019-04-22T16:08:30        ______________________________________________________________________________________________      Risk  Assessment/Calculations   CHA2DS2-VASc Score = 4   This indicates a 4.8% annual risk of stroke. The patient's score is based upon: CHF History: 1 HTN History: 1 Diabetes History: 0 Stroke History: 0 Vascular Disease History: 0 Age Score: 2 Gender Score: 0            Physical Exam VS:  BP (!) 140/50 (BP Location: Left Arm, Patient Position: Sitting, Cuff Size: Normal)   Pulse 60   Ht 5' 8 (1.727 m)   Wt 150 lb (68 kg)   SpO2 98%   BMI 22.81 kg/m        Wt Readings from Last 3 Encounters:  11/11/23 150 lb (68 kg)  06/30/23 148 lb (67.1 kg)  08/13/22 148 lb (67.1 kg)    GEN:  Well nourished, well developed in no acute distress NECK: No JVD; No carotid bruits CARDIAC: RRR, no murmurs, rubs, gallops; radial and PT pulses 2+ bilaterally RESPIRATORY:  Clear to auscultation without rales, wheezing or rhonchi  ABDOMEN: Soft, non-tender, non-distended EXTREMITIES:  No edema; No deformity   ASSESSMENT AND PLAN  HTN, goal <130/80 - not at goal by office or home BP readings of 140s-160s systolic; unable to tolerate amlodipine  d/t lower extremity edema; on valsartan 320mg  daily initiated 10/15/23 by PCP; BMET 10/15/23 BUN 15, Creat 1.26, Na 135, K 4.8 Will increase carvedilol  to 12.5mg  BID  BMET today, to reassess renal function since valsartan initiated 10/15/23 Follow up with PCP as planned for 12/2023  CAD s/p CABG -  Stable with no anginal symptoms. No indication for ischemic evaluation.   GDMT: carvedilol  12.5mg  BID, atorvastatin  20mg  daily; no ASA, on OAC Recommend 150 minutes weekly of moderate intensity physical activity  Recommend heart-healthy, low sodium diet; lean proteins such as chicken, fish and fresh fruits ad vegetables. Offered referral to PREP exercise program; he politely declines today     HLD, LDL goal <70 -  at goal on atorvastatin  20mg  daily with labs 10/15/2023: TC 120, TGs 56, HDL 62, LDL 45 No changes today   Atrial fib / SSS s/p PPM / Hypercoagulable state  - Paroxysmal, rate-controlled, follows with Dr. Waddell for EP CHA2DS2-VASc Score = 4 [CHF History: 1, HTN History: 1, Diabetes History: 0, Stroke History: 0, Vascular Disease History: 0, Age Score: 2, Gender Score: 0].  Therefore, the patient's annual risk of stroke is 4.8 %.    Continue eliquis  5mg  BID, carvedilol  12.5mg  BID         Dispo: 3 months with Dr. Jeffrie  Signed, Reche GORMAN Finder, NP

## 2023-11-11 NOTE — Patient Instructions (Signed)
 Medication Instructions:   INCREASE Coreg  one ( 1) tablet by mouth ( 12.5 mg) twice daily.   *If you need a refill on your cardiac medications before your next appointment, please call your pharmacy*  Lab Work:  None ordered.  If you have labs (blood work) drawn today and your tests are completely normal, you will receive your results only by: MyChart Message (if you have MyChart) OR A paper copy in the mail If you have any lab test that is abnormal or we need to change your treatment, we will call you to review the results.  Testing/Procedures:  None ordered.  Follow-Up: At [facility], you and your health needs are our priority.  As part of our continuing mission to provide you with exceptional heart care, our providers are all part of one team.  This team includes your primary Cardiologist (physician) and Advanced Practice Providers or APPs (Physician Assistants and Nurse Practitioners) who all work together to provide you with the care you need, when you need it.  Your next appointment:   4 month(s)  Provider:   Oneil Parchment, MD    We recommend signing up for the patient portal called MyChart.  Sign up information is provided on this After Visit Summary.  MyChart is used to connect with patients for Virtual Visits (Telemedicine).  Patients are able to view lab/test results, encounter notes, upcoming appointments, etc.  Non-urgent messages can be sent to your provider as well.   To learn more about what you can do with MyChart, go to [email or web address].   Other Instructions  Your physician wants you to follow-up in: 6 months.  You will receive a reminder letter in the mail two months in advance. If you don't receive a letter, please call our office to schedule the follow-up appointment.

## 2023-11-13 ENCOUNTER — Ambulatory Visit (HOSPITAL_BASED_OUTPATIENT_CLINIC_OR_DEPARTMENT_OTHER): Payer: Self-pay | Admitting: Family

## 2023-11-13 LAB — CUP PACEART REMOTE DEVICE CHECK
Battery Remaining Longevity: 70 mo
Battery Remaining Percentage: 63 %
Battery Voltage: 3.01 V
Brady Statistic AP VP Percent: 6 %
Brady Statistic AP VS Percent: 49 %
Brady Statistic AS VP Percent: 1.3 %
Brady Statistic AS VS Percent: 43 %
Brady Statistic RA Percent Paced: 55 %
Brady Statistic RV Percent Paced: 7.2 %
Date Time Interrogation Session: 20250930020026
Implantable Lead Connection Status: 753985
Implantable Lead Connection Status: 753985
Implantable Lead Implant Date: 20210405
Implantable Lead Implant Date: 20210405
Implantable Lead Location: 753859
Implantable Lead Location: 753860
Implantable Pulse Generator Implant Date: 20210405
Lead Channel Impedance Value: 400 Ohm
Lead Channel Impedance Value: 410 Ohm
Lead Channel Pacing Threshold Amplitude: 0.75 V
Lead Channel Pacing Threshold Amplitude: 1 V
Lead Channel Pacing Threshold Pulse Width: 0.5 ms
Lead Channel Pacing Threshold Pulse Width: 0.5 ms
Lead Channel Sensing Intrinsic Amplitude: 12 mV
Lead Channel Sensing Intrinsic Amplitude: 2.1 mV
Lead Channel Setting Pacing Amplitude: 2 V
Lead Channel Setting Pacing Amplitude: 2.5 V
Lead Channel Setting Pacing Pulse Width: 0.5 ms
Lead Channel Setting Sensing Sensitivity: 2 mV
Pulse Gen Model: 2272
Pulse Gen Serial Number: 3815020

## 2023-11-16 ENCOUNTER — Ambulatory Visit: Payer: Self-pay | Admitting: Internal Medicine

## 2023-11-17 NOTE — Progress Notes (Signed)
 Remote PPM Transmission

## 2023-11-20 NOTE — Progress Notes (Signed)
 Remote PPM Transmission

## 2023-11-26 DIAGNOSIS — I10 Essential (primary) hypertension: Secondary | ICD-10-CM | POA: Diagnosis not present

## 2023-11-26 DIAGNOSIS — I5032 Chronic diastolic (congestive) heart failure: Secondary | ICD-10-CM | POA: Diagnosis not present

## 2023-11-26 DIAGNOSIS — N1831 Chronic kidney disease, stage 3a: Secondary | ICD-10-CM | POA: Diagnosis not present

## 2023-11-26 DIAGNOSIS — I25810 Atherosclerosis of coronary artery bypass graft(s) without angina pectoris: Secondary | ICD-10-CM | POA: Diagnosis not present

## 2023-12-12 ENCOUNTER — Telehealth: Payer: Self-pay | Admitting: Family

## 2023-12-12 DIAGNOSIS — E7800 Pure hypercholesterolemia, unspecified: Secondary | ICD-10-CM | POA: Diagnosis not present

## 2023-12-12 DIAGNOSIS — E039 Hypothyroidism, unspecified: Secondary | ICD-10-CM | POA: Diagnosis not present

## 2023-12-12 DIAGNOSIS — N1831 Chronic kidney disease, stage 3a: Secondary | ICD-10-CM | POA: Diagnosis not present

## 2023-12-12 DIAGNOSIS — I25810 Atherosclerosis of coronary artery bypass graft(s) without angina pectoris: Secondary | ICD-10-CM | POA: Diagnosis not present

## 2023-12-12 DIAGNOSIS — I5032 Chronic diastolic (congestive) heart failure: Secondary | ICD-10-CM | POA: Diagnosis not present

## 2023-12-12 DIAGNOSIS — I10 Essential (primary) hypertension: Secondary | ICD-10-CM | POA: Diagnosis not present

## 2023-12-12 MED ORDER — CARVEDILOL 12.5 MG PO TABS: 25.0000 mg | ORAL_TABLET | Freq: Two times a day (BID) | ORAL | Status: DC

## 2023-12-12 NOTE — Telephone Encounter (Signed)
 Increase Coreg  to 25mg  BID. Report back BP readings in 1-2 weeks.   Corrado Hymon S Tanise Russman, NP

## 2023-12-12 NOTE — Telephone Encounter (Signed)
 She faxed over his BP log, 140's to 170's on average. She was calling about considering increasing his medication or adding.

## 2023-12-12 NOTE — Telephone Encounter (Signed)
 Left message no blood pressure readings and requested be refaxed

## 2023-12-12 NOTE — Telephone Encounter (Signed)
 Faxed received and sent to Caitlin for review

## 2023-12-12 NOTE — Telephone Encounter (Signed)
 Advised patient, verbalized understanding   Will double up on Coreg  12.5 mg and call back when needs filled

## 2023-12-18 DIAGNOSIS — I10 Essential (primary) hypertension: Secondary | ICD-10-CM | POA: Diagnosis not present

## 2023-12-18 DIAGNOSIS — R7303 Prediabetes: Secondary | ICD-10-CM | POA: Diagnosis not present

## 2023-12-18 DIAGNOSIS — N1831 Chronic kidney disease, stage 3a: Secondary | ICD-10-CM | POA: Diagnosis not present

## 2023-12-26 DIAGNOSIS — N1831 Chronic kidney disease, stage 3a: Secondary | ICD-10-CM | POA: Diagnosis not present

## 2023-12-26 DIAGNOSIS — I25810 Atherosclerosis of coronary artery bypass graft(s) without angina pectoris: Secondary | ICD-10-CM | POA: Diagnosis not present

## 2023-12-26 DIAGNOSIS — I5032 Chronic diastolic (congestive) heart failure: Secondary | ICD-10-CM | POA: Diagnosis not present

## 2023-12-26 DIAGNOSIS — I10 Essential (primary) hypertension: Secondary | ICD-10-CM | POA: Diagnosis not present

## 2024-01-11 DIAGNOSIS — N1831 Chronic kidney disease, stage 3a: Secondary | ICD-10-CM | POA: Diagnosis not present

## 2024-01-11 DIAGNOSIS — I5032 Chronic diastolic (congestive) heart failure: Secondary | ICD-10-CM | POA: Diagnosis not present

## 2024-01-11 DIAGNOSIS — E7800 Pure hypercholesterolemia, unspecified: Secondary | ICD-10-CM | POA: Diagnosis not present

## 2024-01-11 DIAGNOSIS — E039 Hypothyroidism, unspecified: Secondary | ICD-10-CM | POA: Diagnosis not present

## 2024-01-11 DIAGNOSIS — I25810 Atherosclerosis of coronary artery bypass graft(s) without angina pectoris: Secondary | ICD-10-CM | POA: Diagnosis not present

## 2024-01-11 DIAGNOSIS — I10 Essential (primary) hypertension: Secondary | ICD-10-CM | POA: Diagnosis not present

## 2024-01-19 DIAGNOSIS — I10 Essential (primary) hypertension: Secondary | ICD-10-CM | POA: Diagnosis not present

## 2024-01-19 DIAGNOSIS — K439 Ventral hernia without obstruction or gangrene: Secondary | ICD-10-CM | POA: Diagnosis not present

## 2024-01-19 DIAGNOSIS — N1831 Chronic kidney disease, stage 3a: Secondary | ICD-10-CM | POA: Diagnosis not present

## 2024-01-19 DIAGNOSIS — E1122 Type 2 diabetes mellitus with diabetic chronic kidney disease: Secondary | ICD-10-CM | POA: Diagnosis not present

## 2024-02-10 ENCOUNTER — Ambulatory Visit (INDEPENDENT_AMBULATORY_CARE_PROVIDER_SITE_OTHER): Payer: Medicare HMO

## 2024-02-10 DIAGNOSIS — I480 Paroxysmal atrial fibrillation: Secondary | ICD-10-CM

## 2024-02-10 LAB — CUP PACEART REMOTE DEVICE CHECK
Battery Remaining Longevity: 67 mo
Battery Remaining Percentage: 61 %
Battery Voltage: 3.01 V
Brady Statistic AP VP Percent: 8 %
Brady Statistic AP VS Percent: 55 %
Brady Statistic AS VP Percent: 1.3 %
Brady Statistic AS VS Percent: 35 %
Brady Statistic RA Percent Paced: 62 %
Brady Statistic RV Percent Paced: 9.4 %
Date Time Interrogation Session: 20251230020012
Implantable Lead Connection Status: 753985
Implantable Lead Connection Status: 753985
Implantable Lead Implant Date: 20210405
Implantable Lead Implant Date: 20210405
Implantable Lead Location: 753859
Implantable Lead Location: 753860
Implantable Pulse Generator Implant Date: 20210405
Lead Channel Impedance Value: 400 Ohm
Lead Channel Impedance Value: 400 Ohm
Lead Channel Pacing Threshold Amplitude: 0.75 V
Lead Channel Pacing Threshold Amplitude: 1 V
Lead Channel Pacing Threshold Pulse Width: 0.5 ms
Lead Channel Pacing Threshold Pulse Width: 0.5 ms
Lead Channel Sensing Intrinsic Amplitude: 12 mV
Lead Channel Sensing Intrinsic Amplitude: 2.1 mV
Lead Channel Setting Pacing Amplitude: 2 V
Lead Channel Setting Pacing Amplitude: 2.5 V
Lead Channel Setting Pacing Pulse Width: 0.5 ms
Lead Channel Setting Sensing Sensitivity: 2 mV
Pulse Gen Model: 2272
Pulse Gen Serial Number: 3815020

## 2024-02-11 ENCOUNTER — Ambulatory Visit: Payer: Self-pay | Admitting: Internal Medicine

## 2024-02-11 NOTE — Progress Notes (Signed)
 Remote PPM Transmission

## 2024-03-11 ENCOUNTER — Ambulatory Visit (HOSPITAL_BASED_OUTPATIENT_CLINIC_OR_DEPARTMENT_OTHER): Admitting: Cardiology

## 2024-03-11 ENCOUNTER — Encounter (HOSPITAL_BASED_OUTPATIENT_CLINIC_OR_DEPARTMENT_OTHER): Payer: Self-pay | Admitting: Cardiology

## 2024-03-11 VITALS — BP 100/46 | HR 60 | Ht 67.5 in | Wt 154.6 lb

## 2024-03-11 DIAGNOSIS — I10 Essential (primary) hypertension: Secondary | ICD-10-CM | POA: Diagnosis not present

## 2024-03-11 DIAGNOSIS — E785 Hyperlipidemia, unspecified: Secondary | ICD-10-CM

## 2024-03-11 DIAGNOSIS — I25709 Atherosclerosis of coronary artery bypass graft(s), unspecified, with unspecified angina pectoris: Secondary | ICD-10-CM

## 2024-03-11 DIAGNOSIS — I4510 Unspecified right bundle-branch block: Secondary | ICD-10-CM | POA: Diagnosis not present

## 2024-03-11 DIAGNOSIS — I480 Paroxysmal atrial fibrillation: Secondary | ICD-10-CM

## 2024-03-11 DIAGNOSIS — D6859 Other primary thrombophilia: Secondary | ICD-10-CM

## 2024-03-11 DIAGNOSIS — I495 Sick sinus syndrome: Secondary | ICD-10-CM | POA: Diagnosis not present

## 2024-03-11 MED ORDER — CARVEDILOL 12.5 MG PO TABS: 12.5000 mg | ORAL_TABLET | Freq: Two times a day (BID) | ORAL | 3 refills | Status: AC

## 2024-03-11 NOTE — Patient Instructions (Signed)
 Medication Instructions:  Your physician has recommended you make the following change in your medication:  1.) decrease carvedilol  (Coreg ) to 12.5 mg - one tablet twice daily  *If you need a refill on your cardiac medications before your next appointment, please call your pharmacy*  Lab Work: none  Testing/Procedures: none  Follow-Up: At [facility], you and your health needs are our priority.  As part of our continuing mission to provide you with exceptional heart care, our providers are all part of one team.  This team includes your primary Cardiologist (physician) and Advanced Practice Providers or APPs (Physician Assistants and Nurse Practitioners) who all work together to provide you with the care you need, when you need it.  Your next appointment:   6 month(s)  Provider:   Reche Finder, NP

## 2024-03-11 NOTE — Progress Notes (Signed)
 " Cardiology Office Note:  .   Date:  03/11/2024  ID:  Wayne Mitchell, DOB 1935/02/12, MRN [id number] PCP: Ransom Other, MD  [facility] Providers Cardiologist:  Oneil Parchment, MD Cardiology APP:  Vannie Reche RAMAN, NP  Electrophysiologist:  Danelle Birmingham, MD     History of Present Illness: .   Wayne Mitchell is a 89 y.o. male Discussed the use of AI scribe  History of Present Illness Wayne Mitchell is an 89 year old male with coronary artery disease, status post three-vessel CABG and pacemaker replacement, who presents for follow-up.  Coronary artery disease and cardiac history - Status post three-vessel coronary artery bypass graft (CABG) on June 02, 2017. - Prior cardiac catheterization on May 30, 2017 revealed critical stenosis at the ostium of the LAD and severe stenosis at the first OM. - Ejection fraction was 60-65% at time of catheterization. - History of carotid artery disease.  Pacemaker and cardiac conduction abnormalities - Pacemaker replacement due to sick sinus syndrome. - Atrial pacing with right bundle branch block.  Cardiovascular symptoms - Feels washed out, fatigued, tired, and dizzy. - No syncopal episodes. - Lacks motivation to work out and has decreased participation in [employer].  Hypertension - History of elevated blood pressure at last visit. - Carvedilol  dosage increased at last visit. - Current medications include carvedilol  25 mg twice a day and valsartan 320 mg daily.  Diabetes mellitus - Last hemoglobin A1c was 6.7. - Current medication includes Jardiance 10 mg daily.  Dyslipidemia - LDL was 45 and HDL was 62, considered excellent. - Current medication includes atorvastatin  20 mg daily.  Renal function and electrolytes - Hemoglobin was 12.3. - Creatinine was 1.26. - Potassium was 4.8.  Peripheral edema - Experienced prior leg swelling with amlodipine  use. - Current medication includes furosemide  20 mg as  needed.  Alcohol use - Reduced beer intake from a couple of beers daily to some days not drinking at all.  Other medical history - History of hypothyroidism. - Current medication includes aspirin  and Eliquis  5 mg twice a day.    Studies Reviewed: .        Results Labs Creatinine (01/2024): 1.25 Potassium (01/2024): 4.5 Hemoglobin A1c (01/2024): 6.7 Hemoglobin (2025): 12.3 ALT (2025): 14 LDL (2025): 45 HDL (2025): 62  Diagnostic Cardiac catheterization (05/30/2017): Critical stenosis at ostium of left anterior descending artery, severe stenosis at first obtuse marginal branch, left ventricular ejection fraction 60-65%, trivial mitral regurgitation, trivial aortic regurgitation Risk Assessment/Calculations:            Physical Exam:   VS:  BP (!) 100/46 (BP Location: Right Arm, Patient Position: Sitting, Cuff Size: Normal)   Pulse 60   Ht 5' 7.5 (1.715 m)   Wt 154 lb 9.6 oz (70.1 kg)   SpO2 96%   BMI 23.86 kg/m    Wt Readings from Last 3 Encounters:  03/11/24 154 lb 9.6 oz (70.1 kg)  11/11/23 150 lb (68 kg)  06/30/23 148 lb (67.1 kg)    GEN: Well nourished, well developed in no acute distress NECK: No JVD; No carotid bruits CARDIAC: RRR, no murmurs, no rubs, no gallops RESPIRATORY:  Clear to auscultation without rales, wheezing or rhonchi  ABDOMEN: Soft, non-tender, non-distended EXTREMITIES:  No edema; No deformity   ASSESSMENT AND PLAN: .    Assessment and Plan Assessment & Plan Coronary artery disease, status post CABG Status post three-vessel CABG on April 22nd, 2019, with critical stenosis at the  ostium of the LAD and severe stenosis at the first OM. Ejection fraction was 60-65% with trivial mitral and aortic valve regurgitation. Currently on atorvastatin , aspirin , and oral anticoagulation with Eliquis . - Continue atorvastatin  20 mg daily - Continue aspirin  - Continue Eliquis  5 mg twice a day  Atrial fibrillation Managed with oral anticoagulation. No  recent syncopal episodes reported. - Continue Eliquis  5 mg twice a day  Sick sinus syndrome, status post pacemaker Status post pacemaker replacement secondary to sick sinus syndrome. Currently with atrial pacing and right bundle branch block.  Hypertension Blood pressure was elevated at last visit, leading to an increase in carvedilol . Current blood pressure is 100/46, indicating overmedication. Symptoms include feeling washed out, fatigued, tired, dizzy, and decreased motivation to exercise. Decision made to decrease carvedilol  to improve symptoms while maintaining blood pressure control. - Decreased carvedilol  to 12.5 mg twice a day  Type 2 diabetes mellitus Last hemoglobin A1c was 6.7, indicating good control. Currently on Jardiance. - Continue Jardiance 10 mg daily  Carotid artery disease Prior mild orthostatic changes. - Continue current management  Hypothyroidism No specific discussion or changes in management during this visit. - Continue current management         Dispo: 6 mth APP  Signed, Oneil Parchment, MD  "
